# Patient Record
Sex: Male | Born: 1960 | Race: White | Hispanic: No | Marital: Single | Smoking: Current every day smoker
Health system: Southern US, Community
[De-identification: ages and names within clinical notes are randomized; demographics above are authoritative.]

## PROBLEM LIST (undated history)

## (undated) DIAGNOSIS — F4541 Pain disorder exclusively related to psychological factors: Secondary | ICD-10-CM

## (undated) DIAGNOSIS — E785 Hyperlipidemia, unspecified: Secondary | ICD-10-CM

## (undated) DIAGNOSIS — Z973 Presence of spectacles and contact lenses: Secondary | ICD-10-CM

## (undated) DIAGNOSIS — M1990 Unspecified osteoarthritis, unspecified site: Secondary | ICD-10-CM

## (undated) DIAGNOSIS — E119 Type 2 diabetes mellitus without complications: Secondary | ICD-10-CM

## (undated) DIAGNOSIS — F319 Bipolar disorder, unspecified: Secondary | ICD-10-CM

## (undated) DIAGNOSIS — I10 Essential (primary) hypertension: Secondary | ICD-10-CM

## (undated) HISTORY — PX: KNEE ARTHROSCOPY: SUR90

---

## 1998-06-05 ENCOUNTER — Encounter: Payer: Self-pay | Admitting: Emergency Medicine

## 1998-06-05 ENCOUNTER — Emergency Department (HOSPITAL_COMMUNITY): Admission: EM | Admit: 1998-06-05 | Discharge: 1998-06-05 | Payer: Self-pay | Admitting: Emergency Medicine

## 1998-07-06 ENCOUNTER — Emergency Department (HOSPITAL_COMMUNITY): Admission: EM | Admit: 1998-07-06 | Discharge: 1998-07-06 | Payer: Self-pay | Admitting: Emergency Medicine

## 2001-02-18 ENCOUNTER — Ambulatory Visit (HOSPITAL_COMMUNITY): Admission: RE | Admit: 2001-02-18 | Discharge: 2001-02-18 | Payer: Self-pay | Admitting: Gastroenterology

## 2001-02-18 ENCOUNTER — Encounter (INDEPENDENT_AMBULATORY_CARE_PROVIDER_SITE_OTHER): Payer: Self-pay | Admitting: *Deleted

## 2004-04-21 ENCOUNTER — Ambulatory Visit (HOSPITAL_COMMUNITY): Admission: RE | Admit: 2004-04-21 | Discharge: 2004-04-21 | Payer: Self-pay | Admitting: Gastroenterology

## 2007-08-10 ENCOUNTER — Emergency Department (HOSPITAL_COMMUNITY): Admission: EM | Admit: 2007-08-10 | Discharge: 2007-08-10 | Payer: Self-pay | Admitting: Emergency Medicine

## 2009-02-23 ENCOUNTER — Encounter: Admission: RE | Admit: 2009-02-23 | Discharge: 2009-02-23 | Payer: Self-pay | Admitting: Orthopedic Surgery

## 2009-03-10 ENCOUNTER — Ambulatory Visit (HOSPITAL_BASED_OUTPATIENT_CLINIC_OR_DEPARTMENT_OTHER): Admission: RE | Admit: 2009-03-10 | Discharge: 2009-03-10 | Payer: Self-pay | Admitting: Orthopedic Surgery

## 2009-05-24 ENCOUNTER — Emergency Department (HOSPITAL_COMMUNITY): Admission: EM | Admit: 2009-05-24 | Discharge: 2009-05-25 | Payer: Self-pay | Admitting: Emergency Medicine

## 2010-05-12 ENCOUNTER — Emergency Department (HOSPITAL_BASED_OUTPATIENT_CLINIC_OR_DEPARTMENT_OTHER): Admission: EM | Admit: 2010-05-12 | Discharge: 2010-05-12 | Payer: Self-pay | Admitting: Emergency Medicine

## 2010-10-11 LAB — URINALYSIS, ROUTINE W REFLEX MICROSCOPIC
Bilirubin Urine: NEGATIVE
Glucose, UA: NEGATIVE mg/dL
Hgb urine dipstick: NEGATIVE
Ketones, ur: NEGATIVE mg/dL
Nitrite: NEGATIVE
Protein, ur: NEGATIVE mg/dL
Specific Gravity, Urine: 1.011 (ref 1.005–1.030)
Urobilinogen, UA: 0.2 mg/dL (ref 0.0–1.0)
pH: 6.5 (ref 5.0–8.0)

## 2010-10-13 LAB — POCT HEMOGLOBIN-HEMACUE: Hemoglobin: 14.6 g/dL (ref 13.0–17.0)

## 2010-11-21 NOTE — Op Note (Signed)
NAMEJARREAU, Peter             ACCOUNT NO.:  [id number]   MEDICAL RECORD NO.:  [id number]          PATIENT TYPE:  AMB   LOCATION:  DSC                          FACILITY:  [facility]   PHYSICIAN:  Rodney A. Mortenson, M.D.DATE OF BIRTH:  Oct 14, 1960   DATE OF PROCEDURE:  03/10/2009  DATE OF DISCHARGE:                               OPERATIVE REPORT   JUSTIFICATION:  A 50 year old male has had trouble with his knee for  some time.  He has pain along the medial joint line.  No lateral joint  line tenderness, no fluid in the knee.  An MRI of the knee was done and  this shows some changes in the posterior horn of the medial meniscus.  I  reviewed the MRI myself and I felt there was a small tear in this area.  Because of persistent pain and discomfort, he is now admitted for  arthroscopic evaluation and treatment.  Complications are discussed  preoperatively.  Questions are answered encouraged.   JUSTIFICATION FOR OUTPATIENT SURGERY:  Minimal morbidity.   PREOPERATIVE DIAGNOSIS:  Tear of posterior horn medial meniscus, right  knee.   POSTOPERATIVE DIAGNOSES:  Buttonhole tear posterior horn of the medial  meniscus, right knee; cyst of posterior horn of the lateral meniscus;  grade 2 chondromalacia patella.   OPERATION:  Chondroplasty patella; excised cyst of the posterior horn of  lateral meniscus; partial medial meniscectomy, right knee.   SURGEON:  Lenard Galloway. Chaney Malling, MD   ANESTHESIA:  MAC.   PATHOLOGY:  With the arthroscope of the knee, a very careful examination  was undertaken.  There was grade 2 changes of articular cartilage and  posterior aspect of the patella.  The anterior cruciate ligament was  normal.  In the lateral compartment, there was normal articular  cartilage of the lateral femoral condyle, lateral tibial plateau, and  the lateral meniscus, however, there was a large cyst attached to the  posterior horn of the lateral meniscus.  The arthroscope was then placed  in  the medial compartment.  Initially, the medial meniscus appeared  fairly normal, but with probing there was a through-and-through full-  thickness buttonhole tear of the posterior horn of medial meniscus which  could be displaced to the probe.  Posterior aspect of the medial femoral  condyle did show fraying of articular cartilage.   PROCEDURE:  The patient was placed on the operating table in the supine  position.  A pneumatic tourniquet was brought at the right upper thigh.  The right leg was placed in the leg holder and entire right lower  extremity was prepped with DuraPrep and draped out in the usual manner.  An infusion cannula was placed in the superomedial pouch and the knee  was distended with saline.  Anteromedial and anterolateral portals were  made and the scope was introduced.  Attention was first turned to the  posterior aspect of the patella.  Using chondroplasty shaver through  both in the medial and lateral portal, a debridement of the posterior  aspect of the patella was achieved.  The arthroscope was then placed in  the lateral compartment.  There was a cyst attached to the posterior  horn of the lateral meniscus and this was debrided with chondroplasty  shaver.  Complete decompression of the cyst was achieved.  The  arthroscope was then placed in the medial compartment.  Through both  medial and lateral portals, a series of baskets were inserted and a  through-and-through tear of the posterior horn meniscus was debrided  down to smooth stable rim.  The intraarticular shaver was introduced and  this was smoothed out further and then the ArthroCare wand was inserted  and this was used to sculpt the torn remnant as it extended out to the  mid third of medial meniscus which was normal.  Marcaine was then placed  in the knee and a large bulky pressure dressing was applied and the  patient returned in the recovery room in excellent condition.  Technically, this went  extremely well.   DRAINS:  None.   COMPLICATIONS:  None.   DISPOSITION:  1. Usual postop instructions.  2. Percocet for pain.  3. To my office on Wednesday.      Rodney A. Chaney Malling, M.D.  Electronically Signed     RAM/MEDQ  D:  03/10/2009  T:  03/11/2009  Job:  [id number]

## 2010-11-24 NOTE — Op Note (Signed)
NAME:  Peter Roth, Peter Roth             ACCOUNT NO.:  [id number]   MEDICAL RECORD NO.:  [id number]          PATIENT TYPE:  AMB   LOCATION:  ENDO                         [facility]ACILITY:  [facility]   PHYSICIAN:  John C. Madilyn Fireman, M.D.    DATE OF BIRTH:  1961/01/31   DATE OF PROCEDURE:  04/21/2004  DATE OF DISCHARGE:                                 OPERATIVE REPORT   PROCEDURE:  Colonoscopy.   INDICATIONS FOR PROCEDURE:  History of adenomatous colon polyps with last  colonoscopy in 2002.   DESCRIPTION OF PROCEDURE:  The patient was placed in the left lateral  decubitus position and placed on the pulse monitor with continuous low-flow  oxygen delivered by nasal cannula.  He was sedated with 75 mcg IV fentanyl  and 10 mg IV Versed.  The Olympus video colonoscope was inserted into the  rectum and advanced to the cecum, confirmed by transillumination at  McBurney's point and visualization of the ileocecal valve and appendiceal  orifice.  The prep was good.  The cecum, ascending, transverse, descending,  and sigmoid colon all appeared normal with no masses, polyps, diverticula,  or other mucosal abnormalities.  The rectum likewise appeared normal, and  retroflexed view of the anus revealed no obvious internal hemorrhoids.  The  scope was then withdrawn, and the patient returned to the recovery room in  stable condition.  He tolerated the procedure well, and there were no  immediate complications.   IMPRESSION:  Normal colonoscopy.   PLAN:  Repeat study in 5 years.      JCH/MEDQ  D:  04/21/2004  T:  04/21/2004  Job:  [id number]   cc:   Darreld Mclean, MD

## 2010-11-24 NOTE — Procedures (Signed)
[place]  Patient:    Peter Roth, Peter Roth                  MRN: [id number] Proc. Date: 02/18/01 Adm. Date:  [id number] Attending:  Louie Bun CC:         Darreld Mclean, M.D.   Procedure Report  PROCEDURE:  Colonoscopy with polypectomy.  GASTROENTEROLOGIST:  Everardo All. Madilyn Fireman, M.D.  INDICATIONS FOR PROCEDURE:  Heme-positive stool and family history of colon cancer in first degree relative.  DESCRIPTION OF PROCEDURE:   The patient was placed in the left lateral decubitus position and placed on the pulse monitor with continuous low-flow oxygen delivered by nasal cannula.  He was sedated with 70 mg IV Demerol and 7 mg IV Versed.  The Olympus video colonoscope was inserted into the rectum and advanced to the cecum, confirmed by transillumination of McBurneys point and visualization of the ileocecal valve and appendiceal orifice.  The prep was excellent.  The cecum, ascending, transverse, descending, and sigmoid colon all appeared normal with no masses, polyps, diverticula, or other mucosal abnormalities.  At the rectosigmoid junction at about 20 to 25 cm, there was a 2.5 cm pedunculated polyp removed by snare.  The remainder of the rectum appeared normal.  The colonoscope was then withdrawn, and the patient returned to the recovery room in stable condition.  He tolerated the procedure well, and there were no immediate complications.  IMPRESSIONS:  Large rectosigmoid polyp.  PLAN:  Await histology to rule out malignancy and for determination of next colonoscopy. DD:  02/18/01 TD:  02/18/01 Job: [id number]

## 2010-12-02 ENCOUNTER — Emergency Department (HOSPITAL_BASED_OUTPATIENT_CLINIC_OR_DEPARTMENT_OTHER)
Admission: EM | Admit: 2010-12-02 | Discharge: 2010-12-02 | Disposition: A | Discharge status: Home / Self Care | Payer: Medicare Other | Attending: Emergency Medicine | Admitting: Emergency Medicine

## 2010-12-02 ENCOUNTER — Emergency Department (INDEPENDENT_AMBULATORY_CARE_PROVIDER_SITE_OTHER): Payer: Medicare Other

## 2010-12-02 DIAGNOSIS — E785 Hyperlipidemia, unspecified: Secondary | ICD-10-CM | POA: Insufficient documentation

## 2010-12-02 DIAGNOSIS — F319 Bipolar disorder, unspecified: Secondary | ICD-10-CM | POA: Insufficient documentation

## 2010-12-02 DIAGNOSIS — M25569 Pain in unspecified knee: Secondary | ICD-10-CM

## 2010-12-02 DIAGNOSIS — M4310 Spondylolisthesis, site unspecified: Secondary | ICD-10-CM

## 2010-12-02 DIAGNOSIS — M545 Low back pain, unspecified: Secondary | ICD-10-CM

## 2010-12-02 DIAGNOSIS — Y92410 Unspecified street and highway as the place of occurrence of the external cause: Secondary | ICD-10-CM | POA: Insufficient documentation

## 2010-12-02 DIAGNOSIS — F17200 Nicotine dependence, unspecified, uncomplicated: Secondary | ICD-10-CM | POA: Insufficient documentation

## 2010-12-02 DIAGNOSIS — M549 Dorsalgia, unspecified: Secondary | ICD-10-CM | POA: Insufficient documentation

## 2011-10-03 ENCOUNTER — Other Ambulatory Visit: Payer: Self-pay | Admitting: Gastroenterology

## 2011-10-03 DIAGNOSIS — R197 Diarrhea, unspecified: Secondary | ICD-10-CM

## 2011-10-08 ENCOUNTER — Encounter (HOSPITAL_COMMUNITY): Payer: Self-pay | Admitting: Emergency Medicine

## 2011-10-08 ENCOUNTER — Emergency Department (HOSPITAL_COMMUNITY)
Admission: EM | Admit: 2011-10-08 | Discharge: 2011-10-08 | Disposition: A | Discharge status: Home / Self Care | Payer: PRIVATE HEALTH INSURANCE | Attending: Emergency Medicine | Admitting: Emergency Medicine

## 2011-10-08 DIAGNOSIS — L723 Sebaceous cyst: Secondary | ICD-10-CM | POA: Insufficient documentation

## 2011-10-08 DIAGNOSIS — IMO0002 Reserved for concepts with insufficient information to code with codable children: Secondary | ICD-10-CM

## 2011-10-08 DIAGNOSIS — Z7982 Long term (current) use of aspirin: Secondary | ICD-10-CM | POA: Insufficient documentation

## 2011-10-08 DIAGNOSIS — F17200 Nicotine dependence, unspecified, uncomplicated: Secondary | ICD-10-CM | POA: Insufficient documentation

## 2011-10-08 NOTE — ED Notes (Signed)
Pt left without paperwork.  Angry that we would not 'squeeze the stuff' out of his bump.

## 2011-10-08 NOTE — Discharge Instructions (Signed)
There is no evidence on examination today that the swelling on the back of your head is an abscess or has any infection present to warrant emergency drainage. You should see your doctor for the elective procedure of drainage at this point. If you develop pain, redness, or fever associated with this you can return to the ER for re-evaluation and possible further care.    Epidermal Cyst An epidermal cyst is sometimes called a sebaceous cyst, epidermal inclusion cyst, or infundibular cyst. These cysts usually contain a substance that looks "pasty" or "cheesy" and may have a bad smell. This substance is a protein called keratin. Epidermal cysts are usually found on the face, neck, or trunk. They may also occur in the vaginal area or other parts of the genitalia of both men and women. Epidermal cysts are usually small, painless, slow-growing bumps or lumps that move freely under the skin. It is important not to try to pop them. This may cause an infection and lead to tenderness and swelling. CAUSES  Epidermal cysts may be caused by a deep penetrating injury to the skin or a plugged hair follicle, often associated with acne. SYMPTOMS  Epidermal cysts can become inflamed and cause:  Redness.   Tenderness.   Increased temperature of the skin over the bumps or lumps.   Grayish-white, bad smelling material that drains from the bump or lump.  DIAGNOSIS  Epidermal cysts are easily diagnosed by your caregiver during an exam. Rarely, a tissue sample (biopsy) may be taken to rule out other conditions that may resemble epidermal cysts. TREATMENT   Epidermal cysts often get better and disappear on their own. They are rarely ever cancerous.   If a cyst becomes infected, it may become inflamed and tender. This may require opening and draining the cyst. Treatment with antibiotics may be necessary. When the infection is gone, the cyst may be removed with minor surgery.   Small, inflamed cysts can often be  treated with antibiotics or by injecting steroid medicines.   Sometimes, epidermal cysts become large and bothersome. If this happens, surgical removal in your caregiver's office may be necessary.  HOME CARE INSTRUCTIONS  Only take over-the-counter or prescription medicines as directed by your caregiver.   Take your antibiotics as directed. Finish them even if you start to feel better.  SEEK MEDICAL CARE IF:   Your cyst becomes tender, red, or swollen.   Your condition is not improving or is getting worse.   You have any other questions or concerns.  MAKE SURE YOU:  Understand these instructions.   Will watch your condition.   Will get help right away if you are not doing well or get worse.  Document Released: 05/26/2004 Document Revised: 06/14/2011 Document Reviewed: 01/01/2011 [facility] Patient Information 2012 [place], [place].

## 2011-10-08 NOTE — ED Provider Notes (Signed)
History     CSN: [id number]  Arrival date & time 10/08/11  4098   First MD Initiated Contact with Patient 10/08/11 [phone]      Chief Complaint  Patient presents with  . Abscess    (Consider location/radiation/quality/duration/timing/severity/associated sxs/prior treatment) Patient is a 51 y.o. male presenting with abscess. The history is provided by the patient.  Abscess  This is a recurrent problem. Pertinent negatives include no fever.  Pt reports "I get these bumps on the back of my head that need to be drained". Last drainage was in 1989 and pt reports he was advised at that time that it would likely return. Does not recall when it started to return. It is not painful. There is no drainage from the area. There is no associated fever, chills, or neck pain. Nothing makes the swelling better or worse. No prior treatment, though pt reports his primary doctor has advised in the past that he could drain it, but pt does not want to wait for an appointment.  History reviewed. No pertinent past medical history.  History reviewed. No pertinent past surgical history.  No family history on file.  History  Substance Use Topics  . Smoking status: Current Everyday Smoker -- 0.5 packs/day  . Smokeless tobacco: Not on file  . Alcohol Use: No      Review of Systems  Constitutional: Negative for fever and chills.  HENT: Negative for neck pain and neck stiffness.   Skin: Negative for color change, rash and wound.       Positive area of swelling.  Neurological: Negative for dizziness, weakness, numbness and headaches.    Allergies  Review of patient's allergies indicates no known allergies.  Home Medications   Current Outpatient Rx  Name Route Sig Dispense Refill  . ASPIRIN EC 81 MG PO TBEC Oral Take 81 mg by mouth daily.    Marland Kitchen VITAMIN D 1000 UNITS PO TABS Oral Take 1,000 Units by mouth daily.    . OMEGA-3 FATTY ACIDS 1000 MG PO CAPS Oral Take 1 g by mouth daily.      BP 133/83   Pulse 82  Temp(Src) 98.1 F (36.7 C) (Oral)  Resp 15  SpO2 95%  Physical Exam  Nursing note and vitals reviewed. Constitutional: He is oriented to person, place, and time. He appears well-developed and well-nourished. No distress.  HENT:  Head: Normocephalic and atraumatic.    Right Ear: External ear normal.  Left Ear: External ear normal.  Eyes: Pupils are equal, round, and reactive to light.  Neck: Normal range of motion. Neck supple.  Cardiovascular: Normal rate and regular rhythm.   Pulmonary/Chest: Effort normal. No respiratory distress.  Lymphadenopathy:    He has no cervical adenopathy.  Neurological: He is alert and oriented to person, place, and time.  Skin: Skin is warm and dry. No erythema.    ED Course  Procedures (including critical care time)  Labs Reviewed - No data to display No results found.   1. Cyst       MDM  Clinical appearance most consistent with cyst. No pain or sign of infection on exam. Advised patient to follow-up with PCP for drainage as this is an elective procedure in this setting.        Shaaron Adler, PA-C 10/08/11 0830

## 2011-10-08 NOTE — ED Provider Notes (Signed)
Medical screening examination/treatment/procedure(s) were performed by non-physician practitioner and as supervising physician I was immediately available for consultation/collaboration.   Aadhira Heffernan A Tramaine Sauls, MD 10/08/11 1637 

## 2011-10-08 NOTE — ED Notes (Signed)
Pt. Stated, I get these bumps and I have one in the back of my head that needs to be opened.

## 2011-10-08 NOTE — ED Notes (Signed)
Pt reports having chronic abscesses in the back of his head.  Pt is noted to have one abscess at the crown of his head.  No drainage noted.

## 2011-10-10 ENCOUNTER — Ambulatory Visit
Admission: RE | Admit: 2011-10-10 | Discharge: 2011-10-10 | Disposition: A | Discharge status: Home / Self Care | Payer: PRIVATE HEALTH INSURANCE | Source: Ambulatory Visit | Attending: Gastroenterology | Admitting: Gastroenterology

## 2011-10-10 DIAGNOSIS — R197 Diarrhea, unspecified: Secondary | ICD-10-CM

## 2013-03-13 ENCOUNTER — Emergency Department (HOSPITAL_COMMUNITY)
Admission: EM | Admit: 2013-03-13 | Discharge: 2013-03-13 | Disposition: A | Discharge status: Home / Self Care | Payer: PRIVATE HEALTH INSURANCE | Attending: Emergency Medicine | Admitting: Emergency Medicine

## 2013-03-13 ENCOUNTER — Encounter (HOSPITAL_COMMUNITY): Payer: Self-pay | Admitting: Emergency Medicine

## 2013-03-13 ENCOUNTER — Emergency Department (HOSPITAL_COMMUNITY): Payer: PRIVATE HEALTH INSURANCE

## 2013-03-13 DIAGNOSIS — M25819 Other specified joint disorders, unspecified shoulder: Secondary | ICD-10-CM | POA: Insufficient documentation

## 2013-03-13 DIAGNOSIS — F17200 Nicotine dependence, unspecified, uncomplicated: Secondary | ICD-10-CM | POA: Insufficient documentation

## 2013-03-13 DIAGNOSIS — M7541 Impingement syndrome of right shoulder: Secondary | ICD-10-CM

## 2013-03-13 DIAGNOSIS — Z79899 Other long term (current) drug therapy: Secondary | ICD-10-CM | POA: Insufficient documentation

## 2013-03-13 DIAGNOSIS — Z7982 Long term (current) use of aspirin: Secondary | ICD-10-CM | POA: Insufficient documentation

## 2013-03-13 MED ORDER — IBUPROFEN 800 MG PO TABS: 800.0000 mg | ORAL_TABLET | Freq: Three times a day (TID) | ORAL | Status: DC

## 2013-03-13 MED ORDER — HYDROCODONE-ACETAMINOPHEN 5-325 MG PO TABS: 2.0000 | ORAL_TABLET | Freq: Four times a day (QID) | ORAL | Status: DC | PRN

## 2013-03-13 NOTE — ED Provider Notes (Signed)
CSN: [id number]     Arrival date & time 03/13/13  3086 History   First MD Initiated Contact with Patient 03/13/13 [phone]     Chief Complaint  Patient presents with  . Shoulder Pain   (Consider location/radiation/quality/duration/timing/severity/associated sxs/prior Treatment) HPI Comments: Patient presents emergency department with chief complaint of right shoulder pain. He states that his shoulder is been bothering him for the past month. 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 He believes that the constant lifting has caused shoulder pain. He states it now hurts to move his arm at all. He has not tried taking anything to alleviate his symptoms. He is requesting a note saying that he cannot do any heavy lifting.  The history is provided by the patient. No language interpreter was used.    History reviewed. No pertinent past medical history. History reviewed. No pertinent past surgical history. History reviewed. No pertinent family history. History  Substance Use Topics  . Smoking status: Current Every Day Smoker -- 0.50 packs/day  . Smokeless tobacco: Not on file  . Alcohol Use: No    Review of Systems  All other systems reviewed and are negative.    Allergies  Review of patient's allergies indicates no known allergies.  Home Medications   Current Outpatient Rx  Name  Route  Sig  Dispense  Refill  . aspirin EC 81 MG tablet   Oral   Take 81 mg by mouth daily.         Marland Kitchen atorvastatin (LIPITOR) 20 MG tablet   Oral   Take 20 mg by mouth daily.         . cholecalciferol (VITAMIN D) 1000 UNITS tablet   Oral   Take 1,000 Units by mouth daily.         . divalproex (DEPAKOTE ER) 500 MG 24 hr tablet   Oral   Take 1,000 mg by mouth at bedtime.         . fish oil-omega-3 fatty acids 1000 MG capsule   Oral   Take 1 g by mouth daily.         . sertraline (ZOLOFT) 100 MG tablet   Oral   Take 100 mg by mouth daily.         Marland Kitchen  triamterene-hydrochlorothiazide (MAXZIDE-25) 37.5-25 MG per tablet   Oral   Take 1 tablet by mouth daily.          BP 139/78  Pulse 81  Temp(Src) 97.5 F (36.4 C) (Oral)  Resp 16  SpO2 97% Physical Exam  Nursing note and vitals reviewed. Constitutional: He is oriented to person, place, and time. He appears well-developed and well-nourished.  HENT:  Head: Normocephalic and atraumatic.  Eyes: Conjunctivae and EOM are normal. Pupils are equal, round, and reactive to light. Right eye exhibits no discharge. Left eye exhibits no discharge. No scleral icterus.  Neck: Normal range of motion. Neck supple. No JVD present.  Cardiovascular: Normal rate, regular rhythm, normal heart sounds and intact distal pulses.  Exam reveals no gallop and no friction rub.   No murmur heard. Pulmonary/Chest: Effort normal and breath sounds normal. No respiratory distress. He has no wheezes. He has no rales. He exhibits no tenderness.  Abdominal: Soft. Bowel sounds are normal. He exhibits no distension and no mass. There is no tenderness. There is no rebound and no guarding.  Musculoskeletal: Normal range of motion. He exhibits no edema and no tenderness.  Empty can test negative,  Hawkins test [employer] impingement test is positive, range of motion is limited secondary to pain, strength is 5/5  Neurological: He is alert and oriented to person, place, and time. He has normal reflexes.  CN 3-12 intact  Skin: Skin is warm and dry.  Psychiatric: He has a normal mood and affect. His behavior is normal. Judgment and thought content normal.    ED Course  Procedures (including critical care time) Results for orders placed during the hospital encounter of 05/24/09  URINALYSIS, ROUTINE W REFLEX MICROSCOPIC      Result Value Range   Color, Urine YELLOW  YELLOW   APPearance CLOUDY (*) CLEAR   Specific Gravity, Urine 1.011  1.005 - 1.030   pH 6.5  5.0 - 8.0   Glucose, UA NEGATIVE  NEGATIVE mg/dL   Hgb urine dipstick  NEGATIVE  NEGATIVE   Bilirubin Urine NEGATIVE  NEGATIVE   Ketones, ur NEGATIVE  NEGATIVE mg/dL   Protein, ur NEGATIVE  NEGATIVE mg/dL   Urobilinogen, UA 0.2  0.0 - 1.0 mg/dL   Nitrite NEGATIVE  NEGATIVE   Leukocytes, UA    NEGATIVE   Value: NEGATIVE MICROSCOPIC NOT DONE ON URINES WITH NEGATIVE PROTEIN, BLOOD, LEUKOCYTES, NITRITE, OR GLUCOSE <1000 mg/dL.   Dg Shoulder Right  03/13/2013   *RADIOLOGY REPORT*  Clinical Data: Right shoulder pain.  Trauma.  RIGHT SHOULDER - 2+ VIEW  Comparison: None.  Findings: Located acromioclavicular and glenohumeral joints.  No acute fracture.  There is mild degenerative acromioclavicular joint narrowing and subchondral cystic change, with small inferior spurs. Remote appearing lateral right fourth and fifth rib fractures.  IMPRESSION: 1. Negative for acute osseous injury. 2.  Mild acromioclavicular osteoarthritis.   Original Report Authenticated By: Tiburcio Pea      MDM   1. Shoulder impingement, right    Patient with right shoulder pain. Suspect impingement syndrome. Will x-ray. Consider discharge with ibuprofen, rice therapy, and orthopedic followup.    Roxy Horseman, PA-C 03/13/13 [phone]

## 2013-03-13 NOTE — ED Notes (Signed)
Pt arrived to ED with a complaint of shoulder pain.  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  Pt states the right shoulder has pain that feels like pins and needles.

## 2013-03-16 NOTE — ED Provider Notes (Signed)
Medical screening examination/treatment/procedure(s) were performed by non-physician practitioner and as supervising physician I was immediately available for consultation/collaboration.   Claudean Kinds, MD 03/16/13 [phone]

## 2013-07-01 ENCOUNTER — Encounter (HOSPITAL_COMMUNITY): Payer: Self-pay | Admitting: Emergency Medicine

## 2013-07-01 ENCOUNTER — Emergency Department (HOSPITAL_COMMUNITY)
Admission: EM | Admit: 2013-07-01 | Discharge: 2013-07-01 | Disposition: A | Discharge status: Home / Self Care | Payer: PRIVATE HEALTH INSURANCE | Attending: Emergency Medicine | Admitting: Emergency Medicine

## 2013-07-01 DIAGNOSIS — Z79899 Other long term (current) drug therapy: Secondary | ICD-10-CM | POA: Insufficient documentation

## 2013-07-01 DIAGNOSIS — F17200 Nicotine dependence, unspecified, uncomplicated: Secondary | ICD-10-CM | POA: Insufficient documentation

## 2013-07-01 DIAGNOSIS — L02219 Cutaneous abscess of trunk, unspecified: Secondary | ICD-10-CM | POA: Insufficient documentation

## 2013-07-01 DIAGNOSIS — F319 Bipolar disorder, unspecified: Secondary | ICD-10-CM | POA: Insufficient documentation

## 2013-07-01 DIAGNOSIS — Z7982 Long term (current) use of aspirin: Secondary | ICD-10-CM | POA: Insufficient documentation

## 2013-07-01 DIAGNOSIS — Z791 Long term (current) use of non-steroidal anti-inflammatories (NSAID): Secondary | ICD-10-CM | POA: Insufficient documentation

## 2013-07-01 DIAGNOSIS — E7800 Pure hypercholesterolemia, unspecified: Secondary | ICD-10-CM | POA: Insufficient documentation

## 2013-07-01 DIAGNOSIS — L0291 Cutaneous abscess, unspecified: Secondary | ICD-10-CM

## 2013-07-01 HISTORY — DX: Bipolar disorder, unspecified: F31.9

## 2013-07-01 MED ORDER — CEPHALEXIN 500 MG PO CAPS: ORAL_CAPSULE | ORAL | Status: DC

## 2013-07-01 MED ORDER — SULFAMETHOXAZOLE-TRIMETHOPRIM 800-160 MG PO TABS: 1.0000 | ORAL_TABLET | Freq: Two times a day (BID) | ORAL | Status: DC

## 2013-07-01 NOTE — ED Provider Notes (Signed)
Medical screening examination/treatment/procedure(s) were performed by non-physician practitioner and as supervising physician I was immediately available for consultation/collaboration.  EKG Interpretation   None        Kristianna Saperstein T Hakim Minniefield, MD 07/01/13 2352 

## 2013-07-01 NOTE — ED Notes (Signed)
Patient states that he has had a boil x 2 days

## 2013-07-01 NOTE — ED Provider Notes (Signed)
CSN: [id number]     Arrival date & time 07/01/13  2142 History  This chart was scribed for non-physician practitioner, Junious Silk, PA-C,working with Toy Baker, MD, by Karle Plumber, ED Scribe.  This patient was seen in room WTR5/WTR5 and the patient's care was started at 10:01 PM.  Chief Complaint  Patient presents with  . Recurrent Skin Infections   The history is provided by the patient. No language interpreter was used.   HPI Comments:  Peter Roth is a 52 y.o. male who presents to the Emergency Department complaining of an abscess to the right side of his groin onset two days ago. The pain is sharp and burning. It does not radiate. It has gradually gotten worse over the past 2 days. Pt reports associated warmth to the area. He states he has been using "[employer]" with no relief. He denies fever, chills, nausea, and vomiting. He states he has a h/o abscesses.   Past Medical History  Diagnosis Date  . Bipolar 1 disorder   . High cholesterol    History reviewed. No pertinent past surgical history. History reviewed. No pertinent family history. History  Substance Use Topics  . Smoking status: Current Every Day Smoker -- 0.50 packs/day  . Smokeless tobacco: Not on file  . Alcohol Use: No    Review of Systems  Constitutional: Negative for fever.  Gastrointestinal: Negative for nausea and vomiting.  Skin:       Abscess to right groin.  All other systems reviewed and are negative.    Allergies  Review of patient's allergies indicates no known allergies.  Home Medications   Current Outpatient Rx  Name  Route  Sig  Dispense  Refill  . aspirin EC 81 MG tablet   Oral   Take 81 mg by mouth daily.         Marland Kitchen atorvastatin (LIPITOR) 20 MG tablet   Oral   Take 20 mg by mouth daily.         . cholecalciferol (VITAMIN D) 1000 UNITS tablet   Oral   Take 1,000 Units by mouth daily.         . divalproex (DEPAKOTE ER) 500 MG 24 hr tablet   Oral   Take 1,000  mg by mouth at bedtime.         . fish oil-omega-3 fatty acids 1000 MG capsule   Oral   Take 1 g by mouth daily.         Marland Kitchen HYDROcodone-acetaminophen (NORCO/VICODIN) 5-325 MG per tablet   Oral   Take 2 tablets by mouth every 6 (six) hours as needed for pain.   13 tablet   0   . ibuprofen (ADVIL,MOTRIN) 800 MG tablet   Oral   Take 1 tablet (800 mg total) by mouth 3 (three) times daily.   21 tablet   0   . sertraline (ZOLOFT) 100 MG tablet   Oral   Take 100 mg by mouth daily.         Marland Kitchen triamterene-hydrochlorothiazide (MAXZIDE-25) 37.5-25 MG per tablet   Oral   Take 1 tablet by mouth daily.          Triage Vitals: BP 138/79  Pulse 80  Temp(Src) 98.6 F (37 C) (Oral)  Resp 16  SpO2 100% Physical Exam  Nursing note and vitals reviewed. Constitutional: He is oriented to person, place, and time. He appears well-developed and well-nourished. No distress.  HENT:  Head: Normocephalic and atraumatic.  Right  Ear: External ear normal.  Left Ear: External ear normal.  Nose: Nose normal.  Eyes: Conjunctivae are normal.  Neck: Normal range of motion. No tracheal deviation present.  Cardiovascular: Normal rate, regular rhythm and normal heart sounds.   Pulmonary/Chest: Effort normal and breath sounds normal. No stridor.  Abdominal: Soft. He exhibits no distension. There is no tenderness.  Musculoskeletal: Normal range of motion.  Neurological: He is alert and oriented to person, place, and time.  Skin: Skin is warm and dry. He is not diaphoretic.  1 cm area of induration with central pustule with surrounding erythema. No perianal involvement. No scrotal involvement.   Psychiatric: He has a normal mood and affect. His behavior is normal.    ED Course  Procedures (including critical care time) DIAGNOSTIC STUDIES: Oxygen Saturation is 100% on RA, normal by my interpretation.   COORDINATION OF CARE: 10:10 PM- Will I&D abscess. Advised pt to apply warm compresses to the  area. Pt verbalizes understanding and agrees to plan.  INCISION AND DRAINAGE Performed by: Junious Silk, PA-C Consent: Verbal consent obtained. Risks and benefits: risks, benefits and alternatives were discussed Type: abscess  Body area: right groin  Anesthesia: local infiltration  Incision was made with a scalpel.  Local anesthetic: lidocaine 2% with epinephrine  Anesthetic total: 2 ml  Complexity: complex Blunt dissection to break up loculations  Drainage: bloody  Drainage amount: minimal  Patient tolerance: Patient tolerated the procedure well with no immediate complications.  Medications - No data to display  Labs Review Labs Reviewed - No data to display Imaging Review No results found.  EKG Interpretation   None       MDM   1. Abscess and cellulitis    Patient with skin abscess amenable to incision and drainage.  Abscess was not large enough to warrant packing or drain,  wound recheck in 2 days. Encouraged home warm soaks and flushing.  Mild signs of cellulitis is surrounding skin.  Will d/c to home.  Due to only bloody drainage and indurated area, bactrim and keflex were prescribed. Return instructions given. Vital signs stable for discharge. Patient / Family / Caregiver informed of clinical course, understand medical decision-making process, and agree with plan.    I personally performed the services described in this documentation, which was scribed in my presence. The recorded information has been reviewed and is accurate.     Mora Bellman, PA-C 07/01/13 2237

## 2013-11-01 ENCOUNTER — Emergency Department (HOSPITAL_COMMUNITY)
Admission: EM | Admit: 2013-11-01 | Discharge: 2013-11-01 | Disposition: A | Discharge status: Home / Self Care | Payer: PRIVATE HEALTH INSURANCE | Attending: Emergency Medicine | Admitting: Emergency Medicine

## 2013-11-01 ENCOUNTER — Encounter (HOSPITAL_COMMUNITY): Payer: Self-pay | Admitting: Emergency Medicine

## 2013-11-01 DIAGNOSIS — F17200 Nicotine dependence, unspecified, uncomplicated: Secondary | ICD-10-CM | POA: Insufficient documentation

## 2013-11-01 DIAGNOSIS — M25562 Pain in left knee: Secondary | ICD-10-CM

## 2013-11-01 DIAGNOSIS — Z8659 Personal history of other mental and behavioral disorders: Secondary | ICD-10-CM | POA: Insufficient documentation

## 2013-11-01 DIAGNOSIS — E7800 Pure hypercholesterolemia, unspecified: Secondary | ICD-10-CM | POA: Insufficient documentation

## 2013-11-01 DIAGNOSIS — G8929 Other chronic pain: Secondary | ICD-10-CM | POA: Insufficient documentation

## 2013-11-01 DIAGNOSIS — Z7982 Long term (current) use of aspirin: Secondary | ICD-10-CM | POA: Insufficient documentation

## 2013-11-01 DIAGNOSIS — M25569 Pain in unspecified knee: Secondary | ICD-10-CM | POA: Insufficient documentation

## 2013-11-01 DIAGNOSIS — Z79899 Other long term (current) drug therapy: Secondary | ICD-10-CM | POA: Insufficient documentation

## 2013-11-01 MED ORDER — IBUPROFEN 800 MG PO TABS
800.0000 mg | ORAL_TABLET | Freq: Once | ORAL | Status: AC
  Administered 2013-11-01: 800 mg via ORAL
  Filled 2013-11-01: qty 1

## 2013-11-01 MED ORDER — ACETAMINOPHEN-CODEINE #3 300-30 MG PO TABS: 1.0000 | ORAL_TABLET | Freq: Once | ORAL | Status: DC

## 2013-11-01 MED ORDER — DICLOFENAC SODIUM 50 MG PO TBEC: 50.0000 mg | DELAYED_RELEASE_TABLET | Freq: Two times a day (BID) | ORAL | Status: DC

## 2013-11-01 NOTE — ED Notes (Signed)
Per pt, having chronic knee pain.  Pt states knee has been bothering him for a while.  States no injury.  Pt is to be scoped on Wednesday by Dr. Delorise ShinerGrace

## 2013-11-01 NOTE — ED Provider Notes (Signed)
Medical screening examination/treatment/procedure(s) were performed by non-physician practitioner and as supervising physician I was immediately available for consultation/collaboration.   EKG Interpretation None        Lyanne CoKevin M Lenox Bink, MD 11/01/13 1025

## 2013-11-01 NOTE — ED Notes (Signed)
PA at bedside.

## 2013-11-01 NOTE — Discharge Instructions (Signed)
Please follow up with your primary care physician in 1-2 days. If you do not have one [facility] listed above. Please follow up with Dr. Delorise ShinerGrace your orthopedist and to discuss further pain management for your knee pain. Please take Voltaren as prescribed, please follow RICE method below. Please read all discharge instructions and return precautions.   Knee Pain Knee pain can be a result of an injury or other medical conditions. Treatment will depend on the cause of your pain. HOME CARE  Only take medicine as told by your doctor.  Keep a healthy weight. Being overweight can make the knee hurt more.  Stretch before exercising or playing sports.  If there is constant knee pain, change the way you exercise. Ask your doctor for advice.  Make sure shoes fit well. Choose the right shoe for the sport or activity.  Protect your knees. Wear kneepads if needed.  Rest when you are tired. GET HELP RIGHT AWAY IF:   Your knee pain does not stop.  Your knee pain does not get better.  Your knee joint feels hot to the touch.  You have a fever. MAKE SURE YOU:   Understand these instructions.  Will watch this condition.  Will get help right away if you are not doing well or get worse. Document Released: 09/21/2008 Document Revised: 09/17/2011 Document Reviewed: 09/21/2008 [facility] Patient Information 2014 [place], [place].  RICE: Routine Care for Injuries The routine care of many injuries includes Rest, Ice, Compression, and Elevation (RICE). HOME CARE INSTRUCTIONS  Rest is needed to allow your body to heal. Routine activities can usually be resumed when comfortable. Injured tendons and bones can take up to 6 weeks to heal. Tendons are the cord-like structures that attach muscle to bone.  Ice following an injury helps keep the swelling down and reduces pain.  Put ice in a plastic bag.  Place a towel between your skin and the bag.  Leave the ice  on for 15-20 minutes, 03-04 times a day. Do this while awake, for the first 24 to 48 hours. After that, continue as directed by your caregiver.  Compression helps keep swelling down. It also gives support and helps with discomfort. If an elastic bandage has been applied, it should be removed and reapplied every 3 to 4 hours. It should not be applied tightly, but firmly enough to keep swelling down. Watch fingers or toes for swelling, bluish discoloration, coldness, numbness, or excessive pain. If any of these problems occur, remove the bandage and reapply loosely. Contact your caregiver if these problems continue.  Elevation helps reduce swelling and decreases pain. With extremities, such as the arms, hands, legs, and feet, the injured area should be placed near or above the level of the heart, if possible. SEEK IMMEDIATE MEDICAL CARE IF:  You have persistent pain and swelling.  You develop redness, numbness, or unexpected weakness.  Your symptoms are getting worse rather than improving after several days. These symptoms may indicate that further evaluation or further X-rays are needed. Sometimes, X-rays may not show a small broken bone (fracture) until 1 week or 10 days later. Make a follow-up appointment with your caregiver. Ask when your X-ray results will be ready. Make sure you get your X-ray results. Document Released: 10/07/2000 Document Revised: 09/17/2011 Document Reviewed: 11/24/2010 [facility] Patient Information 2014 [place], [place].

## 2013-11-01 NOTE — ED Provider Notes (Signed)
CSN: [id number]     Arrival date & time 11/01/13  0709 History   First MD Initiated Contact with Patient 11/01/13 [phone]     Chief Complaint  Patient presents with  . Knee Pain     (Consider location/radiation/quality/duration/timing/severity/associated sxs/prior Treatment) HPI Comments: Patient is a 53 yo M PMHx significant for bipolar 1 disorder, hyperlipidemia presenting to the ED for left sided chronic knee pain that has been flaring up over the last two weeks. Patient states he is having "severe throbbing pain" to the entire knee with radiation up and down his leg. He states he has been taking a muscle relaxer prescribed to him by his PCP with no relief. He denies taking any over-the-counter anti-inflammatory medications or following placement below. He states he has not discussed his need for better pain control with his primary care doctor or orthopedist. Denies any new injuries or symptoms from chronic ones.   Patient is a 53 y.o. male presenting with knee pain.  Knee Pain Associated symptoms: no fever     Past Medical History  Diagnosis Date  . Bipolar 1 disorder   . High cholesterol    History reviewed. No pertinent past surgical history. History reviewed. No pertinent family history. History  Substance Use Topics  . Smoking status: Current Every Day Smoker -- 0.50 packs/day  . Smokeless tobacco: Not on file  . Alcohol Use: No    Review of Systems  Constitutional: Negative for fever and chills.  Musculoskeletal: Positive for arthralgias.  All other systems reviewed and are negative.     Allergies  Review of patient's allergies indicates no known allergies.  Home Medications   Prior to Admission medications   Medication Sig Start Date End Date Taking? Authorizing Provider  aspirin EC 81 MG tablet Take 81 mg by mouth daily.    Historical Provider, MD  atorvastatin (LIPITOR) 20 MG tablet Take 20 mg by mouth daily.    Historical Provider, MD  cephALEXin (KEFLEX)  500 MG capsule 2 caps po bid x 7 days 07/01/13   Mora BellmanHannah S Merrell, PA-C  cholecalciferol (VITAMIN D) 1000 UNITS tablet Take 1,000 Units by mouth daily.    Historical Provider, MD  divalproex (DEPAKOTE ER) 500 MG 24 hr tablet Take 1,000 mg by mouth at bedtime.    Historical Provider, MD  lidocaine (LIDODERM) 5 % Place 1 patch onto the skin daily.  06/22/13   Historical Provider, MD  sulfamethoxazole-trimethoprim (SEPTRA DS) 800-160 MG per tablet Take 1 tablet by mouth every 12 (twelve) hours. 07/01/13   Mora BellmanHannah S Merrell, PA-C  triamterene-hydrochlorothiazide (MAXZIDE-25) 37.5-25 MG per tablet Take 1 tablet by mouth daily.    Historical Provider, MD   BP 137/86  Pulse 77  Temp(Src) 98.1 F (36.7 C) (Oral)  Resp 16  SpO2 98% Physical Exam  Nursing note and vitals reviewed. Constitutional: He is oriented to person, place, and time. He appears well-developed and well-nourished. No distress.  HENT:  Head: Normocephalic and atraumatic.  Right Ear: External ear normal.  Left Ear: External ear normal.  Nose: Nose normal.  Mouth/Throat: Oropharynx is clear and moist.  Eyes: Conjunctivae are normal.  Neck: Normal range of motion. Neck supple.  Cardiovascular: Normal rate, regular rhythm, normal heart sounds and intact distal pulses.   Pulmonary/Chest: Effort normal and breath sounds normal. No respiratory distress.  Abdominal: Soft.  Musculoskeletal: Normal range of motion. He exhibits no edema and no tenderness.       Right hip: He exhibits normal range  of motion, normal strength, no tenderness and no bony tenderness.       Left hip: He exhibits normal range of motion, normal strength, no tenderness and no bony tenderness.       Right knee: He exhibits normal range of motion, no swelling, no effusion, no ecchymosis, no deformity, no laceration, no erythema, normal alignment and no bony tenderness. No tenderness found.       Left knee: He exhibits normal range of motion, no swelling, no effusion,  no ecchymosis, no deformity, no laceration, no erythema, normal alignment and no bony tenderness. No tenderness found.       Right ankle: Normal.       Left ankle: Normal.       Right lower leg: Normal.       Left lower leg: Normal.  Neurological: He is alert and oriented to person, place, and time. He has normal strength. GCS eye subscore is 4. GCS verbal subscore is 5. GCS motor subscore is 6.  Sensation grossly intact.   Skin: Skin is warm and dry. He is not diaphoretic. No erythema.  Psychiatric: He has a normal mood and affect.    ED Course  Procedures (including critical care time) Medications  ibuprofen (ADVIL,MOTRIN) tablet 800 mg (800 mg Oral Given 11/01/13 0746)    Labs Review Labs Reviewed - No data to display  Imaging Review No results found.   EKG Interpretation None      MDM   Final diagnoses:  Left knee pain    Filed Vitals:   11/01/13 0720  BP: 137/86  Pulse: 77  Temp: 98.1 F (36.7 C)  Resp: 16   Afebrile, NAD, non-toxic appearing, AAOx4. Patient with chronic left knee pain, no new injuries or symptoms. Neurovascularly intact. Normal sensation. No acute deformity noted or abnormality noted. Left knee is non-TTP. Left hip is non-TTP. Patient able to ambulate. No erythema or warmth to the joint. No swelling. Patient with scheduled surgery for knee on Wednesday. Discussed NSAIDs and RICE method with plan to discuss further pain control with his surgeon or PCP. Return precautions discussed. Patient is agreeable to plan. Patient is stable at time of discharge     Jeannetta EllisJennifer L Dutchess Crosland, PA-C 11/01/13 [id number]

## 2014-03-08 ENCOUNTER — Emergency Department (HOSPITAL_COMMUNITY)
Admission: EM | Admit: 2014-03-08 | Discharge: 2014-03-08 | Disposition: A | Discharge status: Home / Self Care | Payer: PRIVATE HEALTH INSURANCE | Attending: Emergency Medicine | Admitting: Emergency Medicine

## 2014-03-08 ENCOUNTER — Emergency Department (HOSPITAL_COMMUNITY): Payer: PRIVATE HEALTH INSURANCE

## 2014-03-08 ENCOUNTER — Encounter (HOSPITAL_COMMUNITY): Payer: Self-pay | Admitting: Emergency Medicine

## 2014-03-08 DIAGNOSIS — R059 Cough, unspecified: Secondary | ICD-10-CM | POA: Diagnosis present

## 2014-03-08 DIAGNOSIS — F319 Bipolar disorder, unspecified: Secondary | ICD-10-CM | POA: Insufficient documentation

## 2014-03-08 DIAGNOSIS — Z791 Long term (current) use of non-steroidal anti-inflammatories (NSAID): Secondary | ICD-10-CM | POA: Diagnosis not present

## 2014-03-08 DIAGNOSIS — Z79899 Other long term (current) drug therapy: Secondary | ICD-10-CM | POA: Diagnosis not present

## 2014-03-08 DIAGNOSIS — J069 Acute upper respiratory infection, unspecified: Secondary | ICD-10-CM | POA: Insufficient documentation

## 2014-03-08 DIAGNOSIS — R05 Cough: Secondary | ICD-10-CM | POA: Insufficient documentation

## 2014-03-08 DIAGNOSIS — F17200 Nicotine dependence, unspecified, uncomplicated: Secondary | ICD-10-CM | POA: Insufficient documentation

## 2014-03-08 DIAGNOSIS — Z7982 Long term (current) use of aspirin: Secondary | ICD-10-CM | POA: Insufficient documentation

## 2014-03-08 DIAGNOSIS — E7800 Pure hypercholesterolemia, unspecified: Secondary | ICD-10-CM | POA: Diagnosis not present

## 2014-03-08 NOTE — ED Provider Notes (Signed)
CSN: [id number]     Arrival date & time 03/08/14  0408 History   First MD Initiated Contact with Patient 03/08/14 [phone]     Chief Complaint  Patient presents with  . Cough     (Consider location/radiation/quality/duration/timing/severity/associated sxs/prior Treatment) HPI Comments: Patient with URI symptoms for the past 2 days here in the emergency department to make, sure he doesn't have pneumonia, as he doesn't want his in-laws to catch it from him.  Denies any fever.  History of pneumonia is an everyday smoker, has a nonproductive cough, and intermittent rhinitis He took NyQuil x1, 2, days ago, but was afraid to take any more  Patient is a 53 y.o. male presenting with cough.  Cough Cough characteristics:  Non-productive Severity:  Mild Duration:  2 days Timing:  Intermittent Progression:  Unchanged Chronicity:  New Smoker: yes   Relieved by:  Nothing Worsened by:  Nothing tried Ineffective treatments:  None tried Associated symptoms: rhinorrhea   Associated symptoms: no chest pain, no chills, no fever, no headaches, no rash, no shortness of breath, no sore throat and no wheezing     Past Medical History  Diagnosis Date  . Bipolar 1 disorder   . High cholesterol    History reviewed. No pertinent past surgical history. History reviewed. No pertinent family history. History  Substance Use Topics  . Smoking status: Current Every Day Smoker -- 0.50 packs/day  . Smokeless tobacco: Not on file  . Alcohol Use: No    Review of Systems  Constitutional: Negative for fever and chills.  HENT: Positive for rhinorrhea. Negative for sore throat and trouble swallowing.   Respiratory: Positive for cough. Negative for shortness of breath and wheezing.   Cardiovascular: Negative for chest pain.  Skin: Negative for rash.  Neurological: Negative for dizziness and headaches.  All other systems reviewed and are negative.     Allergies  Review of patient's allergies indicates no  known allergies.  Home Medications   Prior to Admission medications   Medication Sig Start Date End Date Taking? Authorizing Provider  aspirin EC 81 MG tablet Take 81 mg by mouth daily.   Yes Historical Provider, MD  atorvastatin (LIPITOR) 20 MG tablet Take 20 mg by mouth daily.   Yes Historical Provider, MD  divalproex (DEPAKOTE ER) 500 MG 24 hr tablet Take 1,000 mg by mouth at bedtime.   Yes Historical Provider, MD  meloxicam (MOBIC) 15 MG tablet Take 1 tablet by mouth daily. 02/01/14  Yes Historical Provider, MD  Pseudoeph-Doxylamine-DM-APAP (NYQUIL PO) Take 30 mLs by mouth at bedtime as needed (cold symptoms).   Yes Historical Provider, MD  sertraline (ZOLOFT) 100 MG tablet Take 1 tablet by mouth daily. 02/27/14  Yes Historical Provider, MD  triamterene-hydrochlorothiazide (MAXZIDE-25) 37.5-25 MG per tablet Take 1 tablet by mouth daily.   Yes Historical Provider, MD   BP 128/77  Pulse 100  Temp(Src) 98.5 F (36.9 C) (Oral)  Resp 18  SpO2 98% Physical Exam  Nursing note and vitals reviewed. Constitutional: He is oriented to person, place, and time. He appears well-developed and well-nourished.  HENT:  Head: Normocephalic.  Mouth/Throat: Oropharynx is clear and moist.  Neck: Normal range of motion.  Cardiovascular: Normal rate and regular rhythm.   Pulmonary/Chest: Effort normal and breath sounds normal. No respiratory distress. He has no wheezes.  Abdominal: Soft.  Musculoskeletal: Normal range of motion.  Lymphadenopathy:    He has no cervical adenopathy.  Neurological: He is alert and oriented to person, place,  and time.  Skin: Skin is warm and dry. Rash noted.    ED Course  Procedures (including critical care time) Labs Review Labs Reviewed - No data to display  Imaging Review Dg Chest 2 View  03/08/2014   CLINICAL DATA:  Cough  EXAM: CHEST  2 VIEW  COMPARISON:  None.  FINDINGS: No confluent airspace opacity, pleural effusion, or pneumothorax. Cardiomediastinal  contours within normal range. Right hilar granuloma. Mild aortic tortuosity. Mild multilevel degenerative changes.  IMPRESSION: No radiographic evidence of active cardiopulmonary disease.   Electronically Signed   By: Jearld Lesch M.D.   On: 03/08/2014 04:46     EKG Interpretation None      MDM   Final diagnoses:  URI (upper respiratory infection)         Arman Filter, NP 03/08/14 [phone]

## 2014-03-08 NOTE — ED Notes (Signed)
Patient is alert and oriented x3.  He is complaining of a cough with congestion in the chest. Patient states that this cough started 2 days ago and he has had some phlegm that was light  Colored green and a slight fever.  Currently he denies any pain.

## 2014-03-08 NOTE — ED Provider Notes (Signed)
Medical screening examination/treatment/procedure(s) were performed by non-physician practitioner and as supervising physician I was immediately available for consultation/collaboration.   EKG Interpretation None       Myrah Strawderman M Laurajean Hosek, MD 03/08/14 0525 

## 2014-03-08 NOTE — Discharge Instructions (Signed)
Upper Respiratory Infection, Adult °An upper respiratory infection (URI) is also sometimes known as the common cold. The upper respiratory tract includes the nose, sinuses, throat, trachea, and bronchi. Bronchi are the airways leading to the lungs. Most people improve within 1 week, but symptoms can last up to 2 weeks. A residual cough may last even longer.  °CAUSES °Many different viruses can infect the tissues lining the upper respiratory tract. The tissues become irritated and inflamed and often become very moist. Mucus production is also common. A cold is contagious. You can easily spread the virus to others by oral contact. This includes kissing, sharing a glass, coughing, or sneezing. Touching your mouth or nose and then touching a surface, which is then touched by another person, can also spread the virus. °SYMPTOMS  °Symptoms typically develop 1 to 3 days after you come in contact with a cold virus. Symptoms vary from person to person. They may include: °· Runny nose. °· Sneezing. °· Nasal congestion. °· Sinus irritation. °· Sore throat. °· Loss of voice (laryngitis). °· Cough. °· Fatigue. °· Muscle aches. °· Loss of appetite. °· Headache. °· Low-grade fever. °DIAGNOSIS  °You might diagnose your own cold based on familiar symptoms, since most people get a cold 2 to 3 times a year. Your caregiver can confirm this based on your exam. Most importantly, your caregiver can check that your symptoms are not due to another disease such as strep throat, sinusitis, pneumonia, asthma, or epiglottitis. Blood tests, throat tests, and X-rays are not necessary to diagnose a common cold, but they may sometimes be helpful in excluding other more serious diseases. Your caregiver will decide if any further tests are required. °RISKS AND COMPLICATIONS  °You may be at risk for a more severe case of the common cold if you smoke cigarettes, have chronic heart disease (such as heart failure) or lung disease (such as asthma), or if  you have a weakened immune system. The very young and very old are also at risk for more serious infections. Bacterial sinusitis, middle ear infections, and bacterial pneumonia can complicate the common cold. The common cold can worsen asthma and chronic obstructive pulmonary disease (COPD). Sometimes, these complications can require emergency medical care and may be life-threatening. °PREVENTION  °The best way to protect against getting a cold is to practice good hygiene. Avoid oral or hand contact with people with cold symptoms. Wash your hands often if contact occurs. There is no clear evidence that vitamin C, vitamin E, echinacea, or exercise reduces the chance of developing a cold. However, it is always recommended to get plenty of rest and practice good nutrition. °TREATMENT  °Treatment is directed at relieving symptoms. There is no cure. Antibiotics are not effective, because the infection is caused by a virus, not by bacteria. Treatment may include: °· Increased fluid intake. Sports drinks offer valuable electrolytes, sugars, and fluids. °· Breathing heated mist or steam (vaporizer or shower). °· Eating chicken soup or other clear broths, and maintaining good nutrition. °· Getting plenty of rest. °· Using gargles or lozenges for comfort. °· Controlling fevers with ibuprofen or acetaminophen as directed by your caregiver. °· Increasing usage of your inhaler if you have asthma. °Zinc gel and zinc lozenges, taken in the first 24 hours of the common cold, can shorten the duration and lessen the severity of symptoms. Pain medicines may help with fever, muscle aches, and throat pain. A variety of non-prescription medicines are available to treat congestion and runny nose. Your caregiver   can make recommendations and may suggest nasal or lung inhalers for other symptoms.  °HOME CARE INSTRUCTIONS  °· Only take over-the-counter or prescription medicines for pain, discomfort, or fever as directed by your  caregiver. °· Use a warm mist humidifier or inhale steam from a shower to increase air moisture. This may keep secretions moist and make it easier to breathe. °· Drink enough water and fluids to keep your urine clear or pale yellow. °· Rest as needed. °· Return to work when your temperature has returned to normal or as your caregiver advises. You may need to stay home longer to avoid infecting others. You can also use a face mask and careful hand washing to prevent spread of the virus. °SEEK MEDICAL CARE IF:  °· After the first few days, you feel you are getting worse rather than better. °· You need your caregiver's advice about medicines to control symptoms. °· You develop chills, worsening shortness of breath, or brown or red sputum. These may be signs of pneumonia. °· You develop yellow or brown nasal discharge or pain in the face, especially when you bend forward. These may be signs of sinusitis. °· You develop a fever, swollen neck glands, pain with swallowing, or white areas in the back of your throat. These may be signs of strep throat. °SEEK IMMEDIATE MEDICAL CARE IF:  °· You have a fever. °· You develop severe or persistent headache, ear pain, sinus pain, or chest pain. °· You develop wheezing, a prolonged cough, cough up blood, or have a change in your usual mucus (if you have chronic lung disease). °· You develop sore muscles or a stiff neck. °Document Released: 12/19/2000 Document Revised: 09/17/2011 Document Reviewed: 09/30/2013 °[employer]® Patient Information ©2015 [employer]. This information is not intended to replace advice given to you by your health care provider. Make sure you discuss any questions you have with your health care provider. ° °Cough, Adult ° A cough is a reflex that helps clear your throat and airways. It can help heal the body or may be a reaction to an irritated airway. A cough may only last 2 or 3 weeks (acute) or may last more than 8 weeks (chronic).  °CAUSES °Acute  cough: °· Viral or bacterial infections. °Chronic cough: °· Infections. °· Allergies. °· Asthma. °· Post-nasal drip. °· Smoking. °· Heartburn or acid reflux. °· Some medicines. °· Chronic lung problems (COPD). °· Cancer. °SYMPTOMS  °· Cough. °· Fever. °· Chest pain. °· Increased breathing rate. °· High-pitched whistling sound when breathing (wheezing). °· Colored mucus that you cough up (sputum). °TREATMENT  °· A bacterial cough may be treated with antibiotic medicine. °· A viral cough must run its course and will not respond to antibiotics. °· Your caregiver may recommend other treatments if you have a chronic cough. °HOME CARE INSTRUCTIONS  °· Only take over-the-counter or prescription medicines for pain, discomfort, or fever as directed by your caregiver. Use cough suppressants only as directed by your caregiver. °· Use a cold steam vaporizer or humidifier in your bedroom or home to help loosen secretions. °· Sleep in a semi-upright position if your cough is worse at night. °· Rest as needed. °· Stop smoking if you smoke. °SEEK IMMEDIATE MEDICAL CARE IF:  °· You have pus in your sputum. °· Your cough starts to worsen. °· You cannot control your cough with suppressants and are losing sleep. °· You begin coughing up blood. °· You have difficulty breathing. °· You develop pain which is   getting worse or is uncontrolled with medicine. °· You have a fever. °MAKE SURE YOU:  °· Understand these instructions. °· Will watch your condition. °· Will get help right away if you are not doing well or get worse. °Document Released: 12/22/2010 Document Revised: 09/17/2011 Document Reviewed: 12/22/2010 °[employer]® Patient Information ©2015 [employer]. This information is not intended to replace advice given to you by your health care provider. Make sure you discuss any questions you have with your health care provider. ° °

## 2014-03-18 ENCOUNTER — Emergency Department (HOSPITAL_COMMUNITY)
Admission: EM | Admit: 2014-03-18 | Discharge: 2014-03-18 | Disposition: A | Discharge status: Home / Self Care | Payer: PRIVATE HEALTH INSURANCE | Attending: Emergency Medicine | Admitting: Emergency Medicine

## 2014-03-18 ENCOUNTER — Emergency Department (HOSPITAL_COMMUNITY): Payer: PRIVATE HEALTH INSURANCE

## 2014-03-18 ENCOUNTER — Encounter (HOSPITAL_COMMUNITY): Payer: Self-pay | Admitting: Emergency Medicine

## 2014-03-18 DIAGNOSIS — E7800 Pure hypercholesterolemia, unspecified: Secondary | ICD-10-CM | POA: Diagnosis not present

## 2014-03-18 DIAGNOSIS — Y9289 Other specified places as the place of occurrence of the external cause: Secondary | ICD-10-CM | POA: Insufficient documentation

## 2014-03-18 DIAGNOSIS — Y9389 Activity, other specified: Secondary | ICD-10-CM | POA: Insufficient documentation

## 2014-03-18 DIAGNOSIS — Z791 Long term (current) use of non-steroidal anti-inflammatories (NSAID): Secondary | ICD-10-CM | POA: Diagnosis not present

## 2014-03-18 DIAGNOSIS — S52599A Other fractures of lower end of unspecified radius, initial encounter for closed fracture: Secondary | ICD-10-CM | POA: Insufficient documentation

## 2014-03-18 DIAGNOSIS — Z79899 Other long term (current) drug therapy: Secondary | ICD-10-CM | POA: Diagnosis not present

## 2014-03-18 DIAGNOSIS — S59919A Unspecified injury of unspecified forearm, initial encounter: Secondary | ICD-10-CM

## 2014-03-18 DIAGNOSIS — S6990XA Unspecified injury of unspecified wrist, hand and finger(s), initial encounter: Secondary | ICD-10-CM

## 2014-03-18 DIAGNOSIS — F319 Bipolar disorder, unspecified: Secondary | ICD-10-CM | POA: Diagnosis not present

## 2014-03-18 DIAGNOSIS — S59909A Unspecified injury of unspecified elbow, initial encounter: Secondary | ICD-10-CM | POA: Diagnosis present

## 2014-03-18 DIAGNOSIS — F17200 Nicotine dependence, unspecified, uncomplicated: Secondary | ICD-10-CM | POA: Diagnosis not present

## 2014-03-18 DIAGNOSIS — Z7982 Long term (current) use of aspirin: Secondary | ICD-10-CM | POA: Diagnosis not present

## 2014-03-18 DIAGNOSIS — S52502A Unspecified fracture of the lower end of left radius, initial encounter for closed fracture: Secondary | ICD-10-CM

## 2014-03-18 DIAGNOSIS — W010XXA Fall on same level from slipping, tripping and stumbling without subsequent striking against object, initial encounter: Secondary | ICD-10-CM | POA: Diagnosis not present

## 2014-03-18 MED ORDER — OXYCODONE-ACETAMINOPHEN 5-325 MG PO TABS: 1.0000 | ORAL_TABLET | Freq: Four times a day (QID) | ORAL | Status: DC | PRN

## 2014-03-18 NOTE — Discharge Instructions (Signed)
Radial Fracture You have a broken bone (fracture) of the forearm. This is the part of your arm between the elbow and your wrist. Your forearm is made up of two bones. These are the radius and ulna. Your fracture is in the radial shaft. This is the bone in your forearm located on the thumb side. A cast or splint is used to protect and keep your injured bone from moving. The cast or splint will be on generally for about 5 to 6 weeks, with individual variations. HOME CARE INSTRUCTIONS   Keep the injured part elevated while sitting or lying down. Keep the injury above the level of your heart (the center of the chest). This will decrease swelling and pain.  Apply ice to the injury for 15-20 minutes, 03-04 times per day while awake, for 2 days. Put the ice in a plastic bag and place a towel between the bag of ice and your cast or splint.  Move your fingers to avoid stiffness and minimize swelling.  If you have a plaster or fiberglass cast:  Do not try to scratch the skin under the cast using sharp or pointed objects.  Check the skin around the cast every day. You may put lotion on any red or sore areas.  Keep your cast dry and clean.  If you have a plaster splint:  Wear the splint as directed.  You may loosen the elastic around the splint if your fingers become numb, tingle, or turn cold or blue.  Do not put pressure on any part of your cast or splint. It may break. Rest your cast only on a pillow for the first 24 hours until it is fully hardened.  Your cast or splint can be protected during bathing with a plastic bag. Do not lower the cast or splint into water.  Only take over-the-counter or prescription medicines for pain, discomfort, or fever as directed by your caregiver. SEEK IMMEDIATE MEDICAL CARE IF:   Your cast gets damaged or breaks.  You have more severe pain or swelling than you did before getting the cast.  You have severe pain when stretching your fingers.  There is a bad  smell, new stains and/or pus-like (purulent) drainage coming from under the cast.  Your fingers or hand turn pale or blue and become cold or your loose feeling. Document Released: 12/06/2005 Document Revised: 09/17/2011 Document Reviewed: 03/04/2006 [facility] Patient Information 2015 [place], [place]. This information is not intended to replace advice given to you by your health care provider. Make sure you discuss any questions you have with your health care provider. RICE: Routine Care for Injuries The routine care of many injuries includes Rest, Ice, Compression, and Elevation (RICE). HOME CARE INSTRUCTIONS  Rest is needed to allow your body to heal. Routine activities can usually be resumed when comfortable. Injured tendons and bones can take up to 6 weeks to heal. Tendons are the cord-like structures that attach muscle to bone.  Ice following an injury helps keep the swelling down and reduces pain.  Put ice in a plastic bag.  Place a towel between your skin and the bag.  Leave the ice on for 15-20 minutes, 3-4 times a day, or as directed by your health care provider. Do this while awake, for the first 24 to 48 hours. After that, continue as directed by your caregiver.  Compression helps keep swelling down. It also gives support and helps with discomfort. If an elastic bandage has been applied, it should be removed  and reapplied every 3 to 4 hours. It should not be applied tightly, but firmly enough to keep swelling down. Watch fingers or toes for swelling, bluish discoloration, coldness, numbness, or excessive pain. If any of these problems occur, remove the bandage and reapply loosely. Contact your caregiver if these problems continue.  Elevation helps reduce swelling and decreases pain. With extremities, such as the arms, hands, legs, and feet, the injured area should be placed near or above the level of the heart, if possible. SEEK IMMEDIATE MEDICAL CARE IF:  You have persistent pain  and swelling.  You develop redness, numbness, or unexpected weakness.  Your symptoms are getting worse rather than improving after several days. These symptoms may indicate that further evaluation or further X-rays are needed. Sometimes, X-rays may not show a small broken bone (fracture) until 1 week or 10 days later. Make a follow-up appointment with your caregiver. Ask when your X-ray results will be ready. Make sure you get your X-ray results. Document Released: 10/07/2000 Document Revised: 06/30/2013 Document Reviewed: 11/24/2010 [facility] Patient Information 2015 [place], [place]. This information is not intended to replace advice given to you by your health care provider. Make sure you discuss any questions you have with your health care provider.

## 2014-03-18 NOTE — ED Notes (Signed)
Waiting for ortho tech 

## 2014-03-18 NOTE — ED Notes (Signed)
Pt c/o left wrist pain, was loaded boat and slip, trying to catch self. Pt has swelling to left wrist.

## 2014-03-18 NOTE — ED Notes (Signed)
Pt reports slipped on a boat, tried to catch his fall with his L hand.  Pt reports L wrist pain, swelling noted.

## 2014-03-18 NOTE — ED Provider Notes (Signed)
Medical screening examination/treatment/procedure(s) were performed by non-physician practitioner and as supervising physician I was immediately available for consultation/collaboration.   EKG Interpretation None       Evah Rashid, MD 03/18/14 2345 

## 2014-03-18 NOTE — ED Provider Notes (Signed)
CSN: [id number]     Arrival date & time 03/18/14  1832 History  This chart was scribed for Antony Madura, PA-C working with Arby Barrette, MD by Evon Slack, ED Scribe. This patient was seen in room WTR9/WTR9 and the patient's care was started at 8:46 PM.   Chief Complaint  Patient presents with  . Wrist Pain    left   The history is provided by the patient. No language interpreter was used.   HPI Comments: Peter Roth is a 53 y.o. male who presents to the Emergency Department complaining of constant left wrist pain onset 4 PM. He states that he slipped while loading his boat and fell onto his wrist trying to catch himself. He denies taking any medication prior to arrival. He states he has associated swelling. Denies numbness, weakness, redness.  Past Medical History  Diagnosis Date  . Bipolar 1 disorder   . High cholesterol    History reviewed. No pertinent past surgical history. No family history on file. History  Substance Use Topics  . Smoking status: Current Every Day Smoker -- 0.50 packs/day  . Smokeless tobacco: Not on file  . Alcohol Use: No    Review of Systems  Musculoskeletal: Positive for arthralgias (left wrist).  Neurological: Negative for numbness.  All other systems reviewed and are negative.   Allergies  Review of patient's allergies indicates no known allergies.  Home Medications   Prior to Admission medications   Medication Sig Start Date End Date Taking? Authorizing Provider  aspirin EC 81 MG tablet Take 81 mg by mouth daily.    Historical Provider, MD  atorvastatin (LIPITOR) 20 MG tablet Take 20 mg by mouth daily.    Historical Provider, MD  divalproex (DEPAKOTE ER) 500 MG 24 hr tablet Take 1,000 mg by mouth at bedtime.    Historical Provider, MD  meloxicam (MOBIC) 15 MG tablet Take 1 tablet by mouth daily. 02/01/14   Historical Provider, MD  oxyCODONE-acetaminophen (PERCOCET/ROXICET) 5-325 MG per tablet Take 1-2 tablets by mouth every 6 (six)  hours as needed for moderate pain or severe pain. 03/18/14   Antony Madura, PA-C  Pseudoeph-Doxylamine-DM-APAP (NYQUIL PO) Take 30 mLs by mouth at bedtime as needed (cold symptoms).    Historical Provider, MD  sertraline (ZOLOFT) 100 MG tablet Take 1 tablet by mouth daily. 02/27/14   Historical Provider, MD  triamterene-hydrochlorothiazide (MAXZIDE-25) 37.5-25 MG per tablet Take 1 tablet by mouth daily.    Historical Provider, MD   Triage Vitals: BP 141/85  Pulse 101  Temp(Src) 98.3 F (36.8 C) (Oral)  Resp 18  SpO2 96%  Physical Exam  Nursing note and vitals reviewed. Constitutional: He is oriented to person, place, and time. He appears well-developed and well-nourished. No distress.  Nontoxic/nonseptic appearing  HENT:  Head: Normocephalic and atraumatic.  Eyes: Conjunctivae and EOM are normal. No scleral icterus.  Neck: Normal range of motion.  Cardiovascular: Normal rate, regular rhythm and intact distal pulses.   Distal radial pulse 2+ in LUE. Capillary refill brisk in all digits.  Pulmonary/Chest: Effort normal. No respiratory distress.  Musculoskeletal:       Left wrist: He exhibits decreased range of motion (secondary to pain), tenderness, bony tenderness and swelling (mild). He exhibits no effusion, no crepitus, no deformity and no laceration.       Left forearm: Normal.       Arms:      Left hand: Normal.       Hands: No crepitus, deformity, or  effusion. Mild soft tissue swelling at areas of tenderness.  Neurological: He is alert and oriented to person, place, and time. He exhibits normal muscle tone. Coordination normal.  Sensation to light touch intact in LUE. Finger to thumb opposition intact in L hand.  Skin: Skin is warm and dry. No rash noted. He is not diaphoretic. No erythema. No pallor.  Psychiatric: He has a normal mood and affect. His behavior is normal.    ED Course  Procedures (including critical care time) DIAGNOSTIC STUDIES: Oxygen Saturation is 96% on RA,  normal by my interpretation.    COORDINATION OF CARE: 8:52 PM-Discussed treatment plan which includes wrist splint with pt at bedside and pt agreed to plan.   Labs Review Labs Reviewed - No data to display  Imaging Review Dg Wrist Complete Left  03/18/2014   CLINICAL DATA:  Left wrist pain following injury  EXAM: LEFT WRIST - COMPLETE 3+ VIEW  COMPARISON:  None.  FINDINGS: Mildly displaced distal left radial fracture is noted. It does not appear to extend into the radiocarpal articulation. No significant soft tissue abnormality is seen. No other focal abnormality is noted. An old healed fifth metacarpal fracture is seen.  IMPRESSION: Mildly displaced distal left radial fracture involving the metaphysis.   Electronically Signed   By: Alcide Clever M.D.   On: 03/18/2014 19:26     EKG Interpretation None      MDM   Final diagnoses:  Radius distal fracture, left, closed, initial encounter    53 year old male presents to the emergency department for left wrist pain after a mechanical fall. Patient is neurovascularly intact. No gross sensory deficits appreciated. No bony deformity, crepitus, or effusion on exam. X-ray today significant for mildly displaced distal left radial fracture. Patient placed in sugar tong splint and ED. He will be referred to hand specialist for outpatient followup. RICE advised. Return precautions discussed and provided. Patient agreeable to plan with no unaddressed concerns.  I personally performed the services described in this documentation, which was scribed in my presence. The recorded information has been reviewed and is accurate.   Filed Vitals:   03/18/14 1854  BP: 141/85  Pulse: 101  Temp: 98.3 F (36.8 C)  TempSrc: Oral  Resp: 18  SpO2: 96%       Antony Madura, PA-C 03/18/14 2142

## 2014-04-09 ENCOUNTER — Encounter (HOSPITAL_COMMUNITY): Payer: Self-pay | Admitting: Emergency Medicine

## 2014-04-09 ENCOUNTER — Emergency Department (HOSPITAL_COMMUNITY)
Admission: EM | Admit: 2014-04-09 | Discharge: 2014-04-09 | Disposition: A | Discharge status: Home / Self Care | Payer: PRIVATE HEALTH INSURANCE | Attending: Emergency Medicine | Admitting: Emergency Medicine

## 2014-04-09 DIAGNOSIS — Z4789 Encounter for other orthopedic aftercare: Secondary | ICD-10-CM | POA: Insufficient documentation

## 2014-04-09 DIAGNOSIS — Z791 Long term (current) use of non-steroidal anti-inflammatories (NSAID): Secondary | ICD-10-CM | POA: Diagnosis not present

## 2014-04-09 DIAGNOSIS — F319 Bipolar disorder, unspecified: Secondary | ICD-10-CM | POA: Insufficient documentation

## 2014-04-09 DIAGNOSIS — Z720 Tobacco use: Secondary | ICD-10-CM | POA: Diagnosis not present

## 2014-04-09 DIAGNOSIS — Z4689 Encounter for fitting and adjustment of other specified devices: Secondary | ICD-10-CM

## 2014-04-09 DIAGNOSIS — Z7982 Long term (current) use of aspirin: Secondary | ICD-10-CM | POA: Diagnosis not present

## 2014-04-09 DIAGNOSIS — E780 Pure hypercholesterolemia: Secondary | ICD-10-CM | POA: Insufficient documentation

## 2014-04-09 DIAGNOSIS — Z79899 Other long term (current) drug therapy: Secondary | ICD-10-CM | POA: Diagnosis not present

## 2014-04-09 NOTE — ED Notes (Signed)
Pt states he wants "his cast off".  States he saw MD a week ago.  Was told then that it was healing well and that "he might can take cast off early"  Pt wants cast off today and states he will cut it off himself.  Here today for arm "sling", no "splint", no "brace".  Pt cannot recall what item was called that MD would put on next.

## 2014-04-09 NOTE — ED Provider Notes (Signed)
Medical screening examination/treatment/procedure(s) were conducted as a shared visit with non-physician practitioner(s) or resident and myself. I personally evaluated the patient during the encounter and agree with the findings.  I have personally reviewed any xrays and/ or EKG's with the provider and I agree with interpretation.  Patient came to the ER requesting that he get his cast off despite his orthopedic Dr. recommending have it off in 2 weeks. Patient says he'll cut off himself if we cannot help him. Patient denies infectious signs. On exam compartment soft, full range of motion of fingers, nv intact left arm. Patient clinically has capacity to make decisions, patient does not have clinical signs of acute psychosis or manic. Patient understands that his bones may not be fully healed and this may worsen his outcome. [facility] called to cut the cast off and followup with orthopedics as recommended. Flexible splint to be given in ER.  Cast removal       Enid SkeensJoshua M Kalan Yeley, MD 04/09/14 [phone]

## 2014-04-09 NOTE — ED Notes (Signed)
Pt escorted to discharge window. Verbalized understanding discharge instructions. In no acute distress.   

## 2014-04-09 NOTE — ED Notes (Signed)
PA at bedside.

## 2014-04-09 NOTE — ED Notes (Signed)
Ortho Tech at bedside.  

## 2014-04-09 NOTE — Discharge Instructions (Signed)
You have elected to have your cast removed early.  Please wear splint to help protect arm as it finishes healing. Follow-up with orthopedics as previously scheduled on 10/14 Return to the ED for new concerns.

## 2014-04-09 NOTE — ED Provider Notes (Signed)
CSN: [id number]     Arrival date & time 04/09/14  0710 History   First MD Initiated Contact with Patient 04/09/14 [phone]     Chief Complaint  Patient presents with  . Wants Cast Off      (Consider location/radiation/quality/duration/timing/severity/associated sxs/prior Treatment) The history is provided by the patient and medical records.   This is a 53 y.o. F with PMH significant for HLP and bipolar disorder, presenting to the ED requesting cast removal.  Patient sustained a left distal radius fracture on 03/18/14, seen in the ED at that time.  Patient was splinted and at follow-up with orthopedics, Dr. Amanda Pea, he was placed into a cast.  States cast is scheduled to be taken off at his next appointment which is 04/21/2014, however states he will take it off now. Patient denies any problems with swelling, numbness, or paresthesias of his left arm.  No fever chills.  Denies pain.  Patient states that the cast has not taken off in the ED, he will cut it off himself.  Past Medical History  Diagnosis Date  . Bipolar 1 disorder   . High cholesterol    History reviewed. No pertinent past surgical history. History reviewed. No pertinent family history. History  Substance Use Topics  . Smoking status: Current Every Day Smoker -- 0.50 packs/day  . Smokeless tobacco: Not on file  . Alcohol Use: No    Review of Systems  Musculoskeletal:       Cast removal  All other systems reviewed and are negative.     Allergies  Sulfa antibiotics  Home Medications   Prior to Admission medications   Medication Sig Start Date End Date Taking? Authorizing Provider  aspirin EC 81 MG tablet Take 81 mg by mouth daily.    Historical Provider, MD  atorvastatin (LIPITOR) 20 MG tablet Take 20 mg by mouth daily.    Historical Provider, MD  divalproex (DEPAKOTE ER) 500 MG 24 hr tablet Take 1,000 mg by mouth at bedtime.    Historical Provider, MD  meloxicam (MOBIC) 15 MG tablet Take 1 tablet by mouth daily.  02/01/14   Historical Provider, MD  sertraline (ZOLOFT) 100 MG tablet Take 1 tablet by mouth daily. 02/27/14   Historical Provider, MD  triamterene-hydrochlorothiazide (MAXZIDE-25) 37.5-25 MG per tablet Take 1 tablet by mouth daily.    Historical Provider, MD   BP 151/92  Pulse 85  Temp(Src) 98.2 F (36.8 C) (Oral)  Resp 20  SpO2 97%  Physical Exam  Nursing note and vitals reviewed. Constitutional: He is oriented to person, place, and time. He appears well-developed and well-nourished.  HENT:  Head: Normocephalic and atraumatic.  Mouth/Throat: Oropharynx is clear and moist.  Eyes: Conjunctivae and EOM are normal. Pupils are equal, round, and reactive to light.  Neck: Normal range of motion.  Cardiovascular: Normal rate, regular rhythm and normal heart sounds.   Pulmonary/Chest: Effort normal and breath sounds normal. No respiratory distress. He has no wheezes.  Musculoskeletal: Normal range of motion.  Left arm in a short arm cast, no noted swelling of the hand or forearm, full range of motion of all fingers, normal sensation throughout arm  Neurological: He is alert and oriented to person, place, and time.  Skin: Skin is warm and dry.  Psychiatric: He has a normal mood and affect.    ED Course  Procedures (including critical care time) Labs Review Labs Reviewed - No data to display  Imaging Review No results found.   EKG Interpretation  None      MDM   Final diagnoses:  Cast removal   53 y.o. M requesting cast removal 2 weeks prior to scheduled removal.  Discussed with patient that if we remove cast early, arm may not be fully healed and may cause further damage and worsen his overall condition.  He acknowledged these risks and is adamant that he wants his cast off.  Cast was removed by Orthopedic tech, no evidence of compartment syndrome.  Patient was placed in wrist splint.  He was encouraged to follow-up with his orthopedist on 04/21/14 as previously scheduled.   Recommended modified use of left arm until follow-up and cleared by surgeon.  Discussed plan with patient, he/she acknowledged understanding and agreed with plan of care.  Return precautions given for new or worsening symptoms.  Garlon HatchetLisa M Virginie Josten, PA-C 04/09/14 [id number]

## 2014-04-09 NOTE — ED Notes (Signed)
Pt verbalized understanding to this RN, Misty StanleyLisa PA, and [employer] EDP that the cast should not be removed until the 14th and that he should not utilize his L arm, until he follows up with his MD.  He understands that removing the cast could cause further damage, but would still like it removed.

## 2014-06-15 ENCOUNTER — Encounter (HOSPITAL_COMMUNITY): Payer: Self-pay | Admitting: Emergency Medicine

## 2014-06-15 ENCOUNTER — Emergency Department (HOSPITAL_COMMUNITY)
Admission: EM | Admit: 2014-06-15 | Discharge: 2014-06-15 | Disposition: A | Discharge status: Home / Self Care | Payer: PRIVATE HEALTH INSURANCE | Attending: Emergency Medicine | Admitting: Emergency Medicine

## 2014-06-15 DIAGNOSIS — Z720 Tobacco use: Secondary | ICD-10-CM | POA: Diagnosis not present

## 2014-06-15 DIAGNOSIS — M1990 Unspecified osteoarthritis, unspecified site: Secondary | ICD-10-CM | POA: Insufficient documentation

## 2014-06-15 DIAGNOSIS — M549 Dorsalgia, unspecified: Secondary | ICD-10-CM | POA: Insufficient documentation

## 2014-06-15 DIAGNOSIS — R635 Abnormal weight gain: Secondary | ICD-10-CM | POA: Insufficient documentation

## 2014-06-15 DIAGNOSIS — M25561 Pain in right knee: Secondary | ICD-10-CM | POA: Diagnosis not present

## 2014-06-15 DIAGNOSIS — F319 Bipolar disorder, unspecified: Secondary | ICD-10-CM | POA: Diagnosis not present

## 2014-06-15 DIAGNOSIS — E780 Pure hypercholesterolemia: Secondary | ICD-10-CM | POA: Diagnosis not present

## 2014-06-15 DIAGNOSIS — Z791 Long term (current) use of non-steroidal anti-inflammatories (NSAID): Secondary | ICD-10-CM | POA: Insufficient documentation

## 2014-06-15 DIAGNOSIS — Z7982 Long term (current) use of aspirin: Secondary | ICD-10-CM | POA: Diagnosis not present

## 2014-06-15 DIAGNOSIS — M25562 Pain in left knee: Secondary | ICD-10-CM | POA: Diagnosis not present

## 2014-06-15 NOTE — ED Provider Notes (Signed)
CSN: [id number]     Arrival date & time 06/15/14  0709 History   First MD Initiated Contact with Patient 06/15/14 [phone]     Chief Complaint  Patient presents with  . Nausea  . Knee Pain     (Consider location/radiation/quality/duration/timing/severity/associated sxs/prior Treatment) The history is provided by the patient. No language interpreter was used.  Peter Roth is a 53 y/o M with PMHx of high cholesterol and bipolar disorder type I presenting to the ED with request for weight loss. Patient reported that he has gained weight over the past couple of months and would like to get a weight loss pill so that he can lose weight. Patient reported that his weight is what is leading to his bilateral knee pain and lower back pain. Reported that the knee pain is an intermittent dull pain that is worse with standing for long periods of time. Stated that his lower back is a mild sharp pain. Stated that he has been dealing with back pain and bilateral knee pain for a many years - reported that he had bilateral knee surgery with a scope to clean out the joint. Patient reported that he saw his PCP regarding this issue and stated that his PCP did not address the weight gain. Patient reported that he would like to be admitted to the hospital for weight loss. Denied fever, chills, chest pain, shortness of breath, difficulty breathing, fall, injuries, numbness, tingling, loss of sensation, urinary and bowel incontinence, nausea, vomiting, diarrhea, melena, hematochezia, abdominal pain, unsteady gait, dizziness, headaches, SI/HI, depression.  PCP Dr. Brayton CavesBosa  Past Medical History  Diagnosis Date  . Bipolar 1 disorder   . High cholesterol    History reviewed. No pertinent past surgical history. History reviewed. No pertinent family history. History  Substance Use Topics  . Smoking status: Current Every Day Smoker -- 0.50 packs/day  . Smokeless tobacco: Not on file  . Alcohol Use: No    Review of  Systems  Constitutional: Negative for fever and chills.  Eyes: Negative for visual disturbance.  Respiratory: Negative for chest tightness and shortness of breath.   Cardiovascular: Negative for chest pain.  Gastrointestinal: Negative for nausea, vomiting and abdominal pain.  Genitourinary: Negative for decreased urine volume.  Musculoskeletal: Positive for back pain and arthralgias (bilateral knee pain).  Neurological: Negative for dizziness, weakness, numbness and headaches.      Allergies  Sulfa antibiotics  Home Medications   Prior to Admission medications   Medication Sig Start Date End Date Taking? Authorizing Provider  aspirin EC 81 MG tablet Take 81 mg by mouth daily.    Historical Provider, MD  atorvastatin (LIPITOR) 20 MG tablet Take 20 mg by mouth daily.    Historical Provider, MD  divalproex (DEPAKOTE ER) 500 MG 24 hr tablet Take 1,000 mg by mouth at bedtime.    Historical Provider, MD  meloxicam (MOBIC) 15 MG tablet Take 1 tablet by mouth daily. 02/01/14   Historical Provider, MD  sertraline (ZOLOFT) 100 MG tablet Take 1 tablet by mouth daily. 02/27/14   Historical Provider, MD  triamterene-hydrochlorothiazide (MAXZIDE-25) 37.5-25 MG per tablet Take 1 tablet by mouth daily.    Historical Provider, MD   BP 158/93 mmHg  Pulse 87  Temp(Src) 97.8 F (36.6 C) (Oral)  Resp 16  SpO2 100% Physical Exam  Constitutional: He is oriented to person, place, and time. He appears well-developed and well-nourished. No distress.  HENT:  Head: Normocephalic and atraumatic.  Eyes: Conjunctivae and EOM  are normal. Pupils are equal, round, and reactive to light. Right eye exhibits no discharge. Left eye exhibits no discharge.  Neck: Normal range of motion. Neck supple. No tracheal deviation present.  Negative neck stiffness Negative nuchal rigidity  Negative pain upon palpation to the c-spine  Cardiovascular: Normal rate, regular rhythm and normal heart sounds.  Exam reveals no  friction rub.   No murmur heard. Cap refill < 3 seconds Negative swelling or pitting edema noted to the lower extremities bilaterally   Pulmonary/Chest: Effort normal and breath sounds normal. No respiratory distress. He has no wheezes. He has no rales.  Patient is able to speak in full sentences without difficulty   Musculoskeletal: Normal range of motion. He exhibits no edema or tenderness.  Negative swelling, erythema, warmth upon palpation, red streaks, malalignments, deformities, abnormalities identified to the knees bilaterally. Negative pain upon palpation. Full range of motion to the knees bilaterally.  Negative deformities identified to the spine. Negative pain upon palpation.  Full ROM to upper and lower extremities without difficulty noted, negative ataxia noted.  Lymphadenopathy:    He has no cervical adenopathy.  Neurological: He is alert and oriented to person, place, and time. No cranial nerve deficit. He exhibits normal muscle tone. Coordination normal.  Cranial nerves III-XII grossly intact Strength 5+/5+ to upper and lower extremities bilaterally with resistance applied, equal distribution noted Equal grip strength bilaterally Sensation intact with differentiation to sharp and dull touch Negative saddle paresthesias bilaterally Negative arm drift Fine motor skills intact Gait proper, proper balance - negative sway, negative drift, negative step-offs Negative facial drooping Negative slurred speech Negative aphasia Patient follows commands well Patient responds to questions appropriately  Skin: Skin is warm and dry. No rash noted. He is not diaphoretic. No erythema.  Psychiatric: He has a normal mood and affect. His behavior is normal. Thought content normal. His mood appears not anxious. He is not aggressive and not actively hallucinating. Thought content is not paranoid and not delusional. He expresses no homicidal and no suicidal ideation. He expresses no suicidal  plans and no homicidal plans.  Pleasant   Nursing note and vitals reviewed.   ED Course  Procedures (including critical care time) Labs Review Labs Reviewed - No data to display  Imaging Review No results found.   EKG Interpretation None      7:39 AM Patient requesting to see attending physician regarding weight loss options, Dr. Casper HarrisonM. Linker to see.  MDM   Final diagnoses:  Weight gain  Arthritis  Bilateral knee pain    Medications - No data to display  Filed Vitals:   06/15/14 0715 06/15/14 0719  BP: 158/93   Pulse:  87  Temp:  97.8 F (36.6 C)  TempSrc:  Oral  Resp:  16  SpO2:  100%   Patient presenting to the ED with request for weight loss pills and or admission for weight loss. Patient has history of bipolar and this appears to be what is occuring. Patient denied SI, HI, depression. Stated that he has been taking his medications as prescribed. Discussed with patient that the ED does not prescribe weight loss pills nor does the ED admit patients for weight loss. Discussed with patient proper dieting and calorie counting as options. Patient spoke with attending physician who reiterated the same. Recommended patient to follow up with PCP regarding these issues and to discuss weight loss options. Doubt septic joint. Doubt cauda equina. Doubt epidural abscess. Doubt compartment syndrome. Doubt acute injuries - no  imaging needed at this time. Patient does not appear a harm to self or others. Patient seen and assessed by attending physician who agrees to plan of discharge. Patient stable, afebrile. Patient not septic appearing. Negative signs of ischemia. Negative signs of respiratory distress. Negative focal neurological deficits. Pulses palpable and strong. Gait proper with negative step offs or red flags. Discharged patient. Discussed with patient to closely monitor symptoms and if symptoms are to worsen or change to report back to the ED - strict return instructions given.   Patient agreed to plan of care, understood, all questions answered.   Raymon Mutton, PA-C 06/15/14 [id number]  Ethelda Chick, MD 06/15/14 [phone]

## 2014-06-15 NOTE — Discharge Instructions (Signed)
Please call your doctor for a followup appointment within 24-48 hours. When you talk to your doctor please let them know that you were seen in the emergency department and have them acquire all of your records so that they can discuss the findings with you and formulate a treatment plan to fully care for your new and ongoing problems. Please call and set-up an appointment with your primary care provider to discuss weight loss options Please continue to monitor symptoms closely and if symptoms are to worsen or change (fever greater than 101, chills, sweating, nausea, vomiting, chest pain, shortness of breathe, difficulty breathing, weakness, numbness, tingling, worsening or changes to pain pattern, fall, injury, loss of sensation, inability to control urine and bowel movements, redness with swelling and hot to the touch of the knees) please report back to the Emergency Department immediately.  Arthritis, Nonspecific Arthritis is inflammation of a joint. This usually means pain, redness, warmth or swelling are present. One or more joints may be involved. There are a number of types of arthritis. Your caregiver may not be able to tell what type of arthritis you have right away. CAUSES  The most common cause of arthritis is the wear and tear on the joint (osteoarthritis). This causes damage to the cartilage, which can break down over time. The knees, hips, back and neck are most often affected by this type of arthritis. Other types of arthritis and common causes of joint pain include:  Sprains and other injuries near the joint. Sometimes minor sprains and injuries cause pain and swelling that develop hours later.  Rheumatoid arthritis. This affects hands, feet and knees. It usually affects both sides of your body at the same time. It is often associated with chronic ailments, fever, weight loss and general weakness.  Crystal arthritis. Gout and pseudo gout can cause occasional acute severe pain, redness  and swelling in the foot, ankle, or knee.  Infectious arthritis. Bacteria can get into a joint through a break in overlying skin. This can cause infection of the joint. Bacteria and viruses can also spread through the blood and affect your joints.  Drug, infectious and allergy reactions. Sometimes joints can become mildly painful and slightly swollen with these types of illnesses. SYMPTOMS   Pain is the main symptom.  Your joint or joints can also be red, swollen and warm or hot to the touch.  You may have a fever with certain types of arthritis, or even feel overall ill.  The joint with arthritis will hurt with movement. Stiffness is present with some types of arthritis. DIAGNOSIS  Your caregiver will suspect arthritis based on your description of your symptoms and on your exam. Testing may be needed to find the type of arthritis:  Blood and sometimes urine tests.  X-ray tests and sometimes CT or MRI scans.  Removal of fluid from the joint (arthrocentesis) is done to check for bacteria, crystals or other causes. Your caregiver (or a specialist) will numb the area over the joint with a local anesthetic, and use a needle to remove joint fluid for examination. This procedure is only minimally uncomfortable.  Even with these tests, your caregiver may not be able to tell what kind of arthritis you have. Consultation with a specialist (rheumatologist) may be helpful. TREATMENT  Your caregiver will discuss with you treatment specific to your type of arthritis. If the specific type cannot be determined, then the following general recommendations may apply. Treatment of severe joint pain includes:  Rest.  Elevation.  Anti-inflammatory medication (for example, ibuprofen) may be prescribed. Avoiding activities that cause increased pain.  Only take over-the-counter or prescription medicines for pain and discomfort as recommended by your caregiver.  Cold packs over an inflamed joint may be  used for 10 to 15 minutes every hour. Hot packs sometimes feel better, but do not use overnight. Do not use hot packs if you are diabetic without your caregiver's permission.  A cortisone shot into arthritic joints may help reduce pain and swelling.  Any acute arthritis that gets worse over the next 1 to 2 days needs to be looked at to be sure there is no joint infection. Long-term arthritis treatment involves modifying activities and lifestyle to reduce joint stress jarring. This can include weight loss. Also, exercise is needed to nourish the joint cartilage and remove waste. This helps keep the muscles around the joint strong. HOME CARE INSTRUCTIONS   Do not take aspirin to relieve pain if gout is suspected. This elevates uric acid levels.  Only take over-the-counter or prescription medicines for pain, discomfort or fever as directed by your caregiver.  Rest the joint as much as possible.  If your joint is swollen, keep it elevated.  Use crutches if the painful joint is in your leg.  Drinking plenty of fluids may help for certain types of arthritis.  Follow your caregiver's dietary instructions.  Try low-impact exercise such as:  Swimming.  Water aerobics.  Biking.  Walking.  Morning stiffness is often relieved by a warm shower.  Put your joints through regular range-of-motion. SEEK MEDICAL CARE IF:   You do not feel better in 24 hours or are getting worse.  You have side effects to medications, or are not getting better with treatment. SEEK IMMEDIATE MEDICAL CARE IF:   You have a fever.  You develop severe joint pain, swelling or redness.  Many joints are involved and become painful and swollen.  There is severe back pain and/or leg weakness.  You have loss of bowel or bladder control. Document Released: 08/02/2004 Document Revised: 09/17/2011 Document Reviewed: 08/18/2008 [facility] Patient Information 2015 [place], [place]. This information is not intended to  replace advice given to you by your health care provider. Make sure you discuss any questions you have with your health care provider.    Emergency Department Resource Guide 1) Find a Doctor and Pay Out of Pocket Although you won't have to find out who is covered by your insurance plan, it is a good idea to ask around and get recommendations. You will then need to call the office and see if the doctor you have chosen will accept you as a new patient and what types of options they offer for patients who are self-pay. Some doctors offer discounts or will set up payment plans for their patients who do not have insurance, but you will need to ask so you aren't surprised when you get to your appointment.  2) Contact Your Local Health Department Not all health departments have doctors that can see patients for sick visits, but many do, so it is worth a call to see if yours does. If you don't know where your local health department is, you can check in your phone book. The CDC also has a tool to help you locate your state's health department, and many state websites also have listings of all of their local health departments.  3) Find a Walk-in Clinic If your illness is not likely to be very severe or complicated, you may  want to try a walk in clinic. These are popping up all over the country in pharmacies, drugstores, and shopping centers. They're usually staffed by nurse practitioners or physician assistants that have been trained to treat common illnesses and complaints. They're usually fairly quick and inexpensive. However, if you have serious medical issues or chronic medical problems, these are probably not your best option.  No Primary Care Doctor: - Call Health Connect at  [phone] - they can help you locate a primary care doctor that  accepts your insurance, provides certain services, etc. - Physician Referral Service- [phone]  Chronic Pain Problems: Organization         Address  Phone    Notes  [facility]  [phone] Patients need to be referred by their primary care doctor.   Medication Assistance: Organization         Address  Phone   Notes  [facility] Medication [facility] [address] [place] [place], [place] [address] [phone] --Must be a resident of [facility] -- Must have NO insurance coverage whatsoever (no Medicaid/ Medicare, etc.) -- The pt. MUST have a primary care doctor that directs their care regularly and follows them in the community   [facility]ist  [phone]   [employer]  [phone]    Agencies that provide inexpensive medical care: Organization         Address  Phone   Notes  [facility]  [phone]   [facility]    [phone]   [facility] [place] [address] [place], [place] [address] [phone]   [facility] [address] [place], [place] [phone]   Planned Parenthood    [phone]   [facility]    [phone]   [facility] and [facility]  [address], [place] Phone:  [phone], Fax:  [phone] Hours of Operation:  9 am - 6 pm, M-F.  Also accepts Medicaid/Medicare and self-pay.  [facility]  [address], [place] Phone: [phone], Fax: [phone]. Hours of Operation:  8:30 am - 5:30 pm, M-F.  Also accepts Medicaid and self-pay.  [facility] [place] [address], [place] Phone: [phone]   [facility] [address] Natasha Bence [place], [place] [phone], Ext. [phone] Mondays & Thursdays: 7-9 AM.  First 15 patients are seen on a first come, first serve basis.    Medicaid-accepting [facility] Providers:  Organization         Address  Phone   Notes  [facility] [address], [place] [phone] Also accepts self-pay patients.  [facility]  [address] Laurell Josephs [place], [place]  [phone]   [facility] [address], [place] [phone]   [facility] [address] [place] [phone]   Renaye Rakers [address], [place]   [phone] Only accepts [place] patients after they have their name applied to their card.   Self-Pay (no insurance) in Outpatient Plastic Surgery Center:  Organization         Address  Phone   Notes  Sickle Cell Patients, [facility] [address] [place], [place] [phone]   [facility] [address] [place], [place] [phone])  [phone]   [facility] [place]  [address], [place] [phone]   [facility]/Dr. Osei-Bonsu  [address], [place] or [address], [place] [phone] Phone number for both [place] and [place] locations is the same.  [facility] [address], [place] [phone]   [facility] [address] [place] or [address] [phone]   [facility] [address], [place] [phone], phone; [phone], fax Sees patients 1st and 3rd Saturday of every month.  Must not qualify for public or private insurance (i.e. Medicaid, Medicare, [place] Health Choice, Veterans' Benefits)  Household income should be no more than 200% of the poverty level The clinic cannot treat you if you are pregnant or think you are pregnant  Sexually transmitted diseases are not treated at the clinic.    Dental Care: Organization         Address  Phone  Notes  [facility] Department of [facility] [address] [place], [place] [phone] Accepts children up to age 56 who are enrolled in [place] or [place] Health Choice; pregnant women with a Medicaid card; and children who have  applied for Medicaid or [place] Health Choice, but were declined, whose parents can pay a reduced fee at time of service.  [facility]  [address], [place] [phone] Accepts children up to age 3 who are enrolled in [place] or [place] Health Choice; pregnant women with a Medicaid card; and children who have applied for Medicaid or [place] Health Choice, but were declined, whose parents can pay a reduced fee at time of service.  [facility]  [address] [place], [place] [phone] Patients are seen by appointment only. Walk-ins are not accepted. [facility] will see patients 3 years of age and older. Monday - Tuesday (8am-5pm) Most Wednesdays (8:30-5pm) $30 per visit, cash only  [facility]  [address], [facility] [phone] Patients are seen by appointment only. Walk-ins are not accepted. [facility] will see patients 57 years of age and older. One Wednesday Evening (Monthly: Volunteer Based).  $30 per visit, cash only  [employer]  [phone] for adults; Children under age 29, call Graduate Pediatric Dentistry at [phone]. Children aged 44-14, please call [phone] to request a pediatric application.  Dental services are provided in all areas of dental care including fillings, crowns and bridges, complete and partial dentures, implants, gum treatment, root canals, and extractions. Preventive care is also provided. Treatment is provided to both adults and children. Patients are selected via a lottery and there is often a waiting list.   [facility] [address] [place]  [phone] [email or web address]   [facility] [address] [place], [place] [phone], Ext. [phone] Second and Fourth Thursday of each month, opens at 6:30 AM; Clinic ends at 9 AM.  Patients are seen on a first-come first-served basis, and a  limited number are seen during each clinic.   [facility]  [address] Ether Griffins [place], [place] [phone]   Eligibility Requirements You must have lived in [place], [place], or [place] counties for at least the last three months.   You cannot be eligible for state  or Teacher, musicfederal sponsored healthcare insurance, including CIGNAVeterans Administration, [place]icaid, or [employer]icare.   You generally cannot be eligible for healthcare insurance through your employer.    How to apply: Eligibility screenings are held every Tuesday and Wednesday afternoon from 1:00 pm until 4:00 pm. You do not need an appointment for the interview!  [facility] [address], [place], [place] [phone]   [facility]  [phone]   [facility]  [phone]   [facility]  [phone]    Behavioral Health Resources in the Community: Intensive Outpatient Programs Organization         Address  Phone  Notes  [facility] [address], [place], [place] [phone]   [facility] [address], [place], [place] [phone]   [facility] [address], [place], [place]  [phone]   [facility] [address],  [place], [place] [phone] or [phone]   Substance Abuse Resources Organization         Address  Phone  Notes  [facility]  [phone]   [facility]  [phone]   [facility]  [phone]   [facility]  [phone]   Residential & Outpatient Substance Abuse Program  [phone]   Psychological Services Organization         Address  Phone  Notes  [facility]  [phone]- [phone]   [facility]  [phone]- [phone]   [facility] [address], [place] [phone] or [phone]    Mobile Crisis Teams Organization          Address  Phone  Notes  [facility], Mobile Crisis Care Unit  [phone]   [facility]  [address] [place], [place] [phone]   Doristine LocksSharon DeEsch [address] [place] [phone]    Self-Help/Support Groups Organization         Address  Phone             Notes  [facility] [place] - variety of support groups  [phone]- [phone] Call for more information  Narcotics Anonymous (NA), [facility] [address], [place]  2 meetings at this location   Statisticianesidential Treatment Programs Organization         Address  Phone  Notes  [facility] [address],    [place]  [phone]   [facility]  [address], [place] [phone], [place], [place] [phone]   [facility] [address], [place] [phone] Admissions: 8am-3pm M-F  [facility] [address]    [place], [place] [phone]   [facility] [address], [place], [place] [phone]   [facility] [address]  [place], [place] [phone]   [facility] - Intensive Outpatient [address], [place], [place] [phone]   [facility] [address]  [place], [place] [phone] or [phone]   [facility] ([facility]) [address] [place], [place] [phone] Accepts Medicaid  [facility] [address]  [place] [phone] Substance Abuse/Addiction Treatment   [facility] Resources Organization         Address  Phone  Notes  [facility]  [phone]) [phone]   Angie FavaJulie Brannon, PhD [address] [place], [place]   [phone]  [phone] or [phone]) [phone]   [facility]   [address] [place], [place] [phone]   [facility] [address] [place], [place] [phone] Insurance/Medicaid/sponsorship  through [facility] and Families [address]                                    [place], [place] [phone] Therapy/tele-psych/case  [facility] [address]   [place], [place] [phone]    Dr. Lolly Mustache  [phone]   [facility] [place]  [employer] [facility] Dept. 1) [address] [place] 2) [address] [place] 3)  [address], [place] [phone]  [phone]   [facility] [place] Child Abuse Hotline [phone] or [phone] (After Hours)

## 2014-06-15 NOTE — ED Notes (Signed)
Pt states he has been on a new diet to try to lose weight, hoping to get help losing weight. Pt states he is nauseated, has knee pain.

## 2014-06-15 NOTE — ED Notes (Signed)
PA at bedside.

## 2014-12-17 ENCOUNTER — Encounter (HOSPITAL_COMMUNITY): Payer: Self-pay | Admitting: Emergency Medicine

## 2014-12-17 ENCOUNTER — Emergency Department (HOSPITAL_COMMUNITY)
Admission: EM | Admit: 2014-12-17 | Discharge: 2014-12-17 | Disposition: A | Discharge status: Home / Self Care | Payer: Medicare Other | Attending: Emergency Medicine | Admitting: Emergency Medicine

## 2014-12-17 DIAGNOSIS — F319 Bipolar disorder, unspecified: Secondary | ICD-10-CM | POA: Insufficient documentation

## 2014-12-17 DIAGNOSIS — Z720 Tobacco use: Secondary | ICD-10-CM | POA: Diagnosis not present

## 2014-12-17 DIAGNOSIS — Z7982 Long term (current) use of aspirin: Secondary | ICD-10-CM | POA: Insufficient documentation

## 2014-12-17 DIAGNOSIS — E780 Pure hypercholesterolemia: Secondary | ICD-10-CM | POA: Insufficient documentation

## 2014-12-17 DIAGNOSIS — Z79899 Other long term (current) drug therapy: Secondary | ICD-10-CM | POA: Diagnosis not present

## 2014-12-17 DIAGNOSIS — Z791 Long term (current) use of non-steroidal anti-inflammatories (NSAID): Secondary | ICD-10-CM | POA: Diagnosis not present

## 2014-12-17 DIAGNOSIS — N492 Inflammatory disorders of scrotum: Secondary | ICD-10-CM | POA: Diagnosis present

## 2014-12-17 DIAGNOSIS — L0291 Cutaneous abscess, unspecified: Secondary | ICD-10-CM

## 2014-12-17 NOTE — ED Notes (Signed)
Per patient, noticed abscess near testicle last night-wants it drained

## 2014-12-17 NOTE — Discharge Instructions (Signed)
Abscess An abscess is an infected area that contains a collection of pus and debris.It can occur in almost any part of the body. An abscess is also known as a furuncle or boil. CAUSES  An abscess occurs when tissue gets infected. This can occur from blockage of oil or sweat glands, infection of hair follicles, or a minor injury to the skin. As the body tries to fight the infection, pus collects in the area and creates pressure under the skin. This pressure causes pain. People with weakened immune systems have difficulty fighting infections and get certain abscesses more often.  SYMPTOMS Usually an abscess develops on the skin and becomes a painful mass that is red, warm, and tender. If the abscess forms under the skin, you may feel a moveable soft area under the skin. Some abscesses break open (rupture) on their own, but most will continue to get worse without care. The infection can spread deeper into the body and eventually into the bloodstream, causing you to feel ill.  DIAGNOSIS  Your caregiver will take your medical history and perform a physical exam. A sample of fluid may also be taken from the abscess to determine what is causing your infection. TREATMENT  Your caregiver may prescribe antibiotic medicines to fight the infection. However, taking antibiotics alone usually does not cure an abscess. Your caregiver may need to make a small cut (incision) in the abscess to drain the pus. In some cases, gauze is packed into the abscess to reduce pain and to continue draining the area. HOME CARE INSTRUCTIONS   Only take over-the-counter or prescription medicines for pain, discomfort, or fever as directed by your caregiver.  If you were prescribed antibiotics, take them as directed. Finish them even if you start to feel better.  If gauze is used, follow your caregiver's directions for changing the gauze.  To avoid spreading the infection:  Keep your draining abscess covered with a  bandage.  Wash your hands well.  Do not share personal care items, towels, or whirlpools with others.  Avoid skin contact with others.  Keep your skin and clothes clean around the abscess.  Keep all follow-up appointments as directed by your caregiver. SEEK MEDICAL CARE IF:   You have increased pain, swelling, redness, fluid drainage, or bleeding.  You have muscle aches, chills, or a general ill feeling.  You have a fever. MAKE SURE YOU:   Understand these instructions.  Will watch your condition.  Will get help right away if you are not doing well or get worse. Document Released: 04/04/2005 Document Revised: 12/25/2011 Document Reviewed: 09/07/2011 [employer] [facility] Patient Information 2015 [place], [place]. This information is not intended to replace advice given to you by your health care provider. Make sure you discuss any questions you have with your health care provider.  Please read above information. Please use warm compresses on affected site, avoid using hot water. Monitor for new or worsening signs or symptoms, please follow up with [place] and wellness in three days for recheck of symptoms. Return sooner if needed.

## 2014-12-17 NOTE — ED Provider Notes (Signed)
CSN: [id number]     Arrival date & time 12/17/14  1004 History   First MD Initiated Contact with Patient 12/17/14 1017     Chief Complaint  Patient presents with  . Abscess    HPI    54 year old male presents with a firm nodule to scrotum. Patient reports that last night lifting a shower felt the nodule, reports it is minimally tender, reports using "oil" on it to soften it up. Patient reports a history of abscesses, noting to previous, no complications treated with incision and drainage. Patient denies a history of MRSA, reports that he is diabetic last CBG reading of 126 this morning, patient reports he smokes. Patient denies headache, fever, nausea, vomiting, abdominal pain, testicular pain, signs of surrounding infection, or any other concerning signs or symptoms. Patient reports that he is going to the beach this weekend and would like it drained so that he can "go swimming".     Past Medical History  Diagnosis Date  . Bipolar 1 disorder   . High cholesterol    History reviewed. No pertinent past surgical history. No family history on file. History  Substance Use Topics  . Smoking status: Current Every Day Smoker -- 0.50 packs/day  . Smokeless tobacco: Not on file  . Alcohol Use: No    Review of Systems  All other systems reviewed and are negative.   Allergies  Sulfa antibiotics  Home Medications   Prior to Admission medications   Medication Sig Start Date End Date Taking? Authorizing Provider  aspirin EC 81 MG tablet Take 81 mg by mouth daily.    Historical Provider, MD  atorvastatin (LIPITOR) 20 MG tablet Take 20 mg by mouth daily.    Historical Provider, MD  divalproex (DEPAKOTE ER) 500 MG 24 hr tablet Take 1,000 mg by mouth at bedtime.    Historical Provider, MD  meloxicam (MOBIC) 15 MG tablet Take 1 tablet by mouth daily. 02/01/14   Historical Provider, MD  sertraline (ZOLOFT) 100 MG tablet Take 1 tablet by mouth daily. 02/27/14   Historical Provider, MD   triamterene-hydrochlorothiazide (MAXZIDE-25) 37.5-25 MG per tablet Take 1 tablet by mouth daily.    Historical Provider, MD   BP 122/67 mmHg  Pulse 77  Temp(Src) 97.9 F (36.6 C) (Oral)  Resp 16  SpO2 100% Physical Exam  Constitutional: He is oriented to person, place, and time. He appears well-developed and well-nourished.  HENT:  Head: Normocephalic and atraumatic.  Eyes: Conjunctivae are normal. Pupils are equal, round, and reactive to light. Right eye exhibits no discharge. Left eye exhibits no discharge. No scleral icterus.  Neck: Normal range of motion. No JVD present. No tracheal deviation present.  Cardiovascular: Normal rate.   Pulmonary/Chest: Effort normal. No stridor.  Abdominal: Soft. He exhibits no distension and no mass. There is no tenderness. There is no rebound and no guarding.  Genitourinary: Testes normal and penis normal. Right testis shows no mass, no swelling and no tenderness. Left testis shows no mass, no swelling and no tenderness.  .5 cm firm nodule to inferior scrotum. Mobile, minimally  tender, no fluctuance, no signs of surrounding cellulitis or infection. Scrotum normal  Neurological: He is alert and oriented to person, place, and time. Coordination normal.  Psychiatric: He has a normal mood and affect. His behavior is normal. Judgment and thought content normal.  Nursing note and vitals reviewed.   ED Course  Procedures (including critical care time) Labs Review Labs Reviewed - No data to display  Imaging Review No results found.   EKG Interpretation None      MDM   Final diagnoses:  Abscess    Labs: None  Imaging: None  Consults: None  Therapeutics: None  Assessment: Abscess  Plan: Patient presents with a very small likely developing abscess. No signs of surrounding infection, minimally tender to palpation, no other signs of systemic infection. Abscess is nonfluctuant at this time and very minimal, incision and drainage at this  time would likely be unproductive and not assisted management of this. Patient will be instructed to use warm compresses, monitor for new or worsening signs or symptoms, and follow-up with [facility] in 3 days for reevaluation, or sooner if needed. His understanding and agreement to today's plan and had no further questions concerns at the time of discharge.      Eyvonne Mechanic, PA-C 12/17/14 1039  Purvis Sheffield, MD 12/17/14 [phone]

## 2015-03-02 ENCOUNTER — Encounter (HOSPITAL_COMMUNITY): Payer: Self-pay | Admitting: Emergency Medicine

## 2015-03-02 ENCOUNTER — Emergency Department (HOSPITAL_COMMUNITY)
Admission: EM | Admit: 2015-03-02 | Discharge: 2015-03-02 | Disposition: A | Discharge status: Home / Self Care | Payer: Medicare Other | Attending: Emergency Medicine | Admitting: Emergency Medicine

## 2015-03-02 DIAGNOSIS — F319 Bipolar disorder, unspecified: Secondary | ICD-10-CM | POA: Insufficient documentation

## 2015-03-02 DIAGNOSIS — E780 Pure hypercholesterolemia: Secondary | ICD-10-CM | POA: Diagnosis not present

## 2015-03-02 DIAGNOSIS — S46911A Strain of unspecified muscle, fascia and tendon at shoulder and upper arm level, right arm, initial encounter: Secondary | ICD-10-CM | POA: Diagnosis not present

## 2015-03-02 DIAGNOSIS — X58XXXA Exposure to other specified factors, initial encounter: Secondary | ICD-10-CM | POA: Insufficient documentation

## 2015-03-02 DIAGNOSIS — Z791 Long term (current) use of non-steroidal anti-inflammatories (NSAID): Secondary | ICD-10-CM | POA: Diagnosis not present

## 2015-03-02 DIAGNOSIS — S4991XA Unspecified injury of right shoulder and upper arm, initial encounter: Secondary | ICD-10-CM | POA: Diagnosis present

## 2015-03-02 DIAGNOSIS — Z720 Tobacco use: Secondary | ICD-10-CM | POA: Diagnosis not present

## 2015-03-02 DIAGNOSIS — Z7982 Long term (current) use of aspirin: Secondary | ICD-10-CM | POA: Diagnosis not present

## 2015-03-02 DIAGNOSIS — Z79899 Other long term (current) drug therapy: Secondary | ICD-10-CM | POA: Diagnosis not present

## 2015-03-02 DIAGNOSIS — Y998 Other external cause status: Secondary | ICD-10-CM | POA: Insufficient documentation

## 2015-03-02 DIAGNOSIS — Y9289 Other specified places as the place of occurrence of the external cause: Secondary | ICD-10-CM | POA: Insufficient documentation

## 2015-03-02 DIAGNOSIS — T148XXA Other injury of unspecified body region, initial encounter: Secondary | ICD-10-CM

## 2015-03-02 DIAGNOSIS — Y9389 Activity, other specified: Secondary | ICD-10-CM | POA: Insufficient documentation

## 2015-03-02 MED ORDER — IBUPROFEN 200 MG PO TABS
600.0000 mg | ORAL_TABLET | Freq: Once | ORAL | Status: AC
  Administered 2015-03-02: 600 mg via ORAL
  Filled 2015-03-02: qty 3

## 2015-03-02 MED ORDER — IBUPROFEN 600 MG PO TABS: 600.0000 mg | ORAL_TABLET | Freq: Four times a day (QID) | ORAL | Status: DC | PRN

## 2015-03-02 NOTE — ED Notes (Signed)
Pt states he thinks he may have pulled a muscle in the back of his right arm  Pt states he has had pain for the past 2 days  Pt states he has been putting ice on it but it is not helping

## 2015-03-02 NOTE — ED Provider Notes (Signed)
CSN: [id number]   Arrival date & time 03/02/15 0237  History  This chart was scribed for Loren Racer, MD by Bethel Born, ED Scribe. This patient was seen in room WA15/WA15 and the patient's care was started at 3:08 AM.  Chief Complaint  Patient presents with  . Arm Pain    HPI The history is provided by the patient. No language interpreter was used.   Peter Roth is a 54 y.o. male who presents to the Emergency Department complaining of constant upper right arm pain with gradual onset 2 days ago after stopping himself from falling with the right arm. He describes the pain as sharp and rates it 5/10 in severity. Icy Hot and heat application provided insufficient pain relief PTA. No numbness or weakness.  Past Medical History  Diagnosis Date  . Bipolar 1 disorder   . High cholesterol     History reviewed. No pertinent past surgical history.  History reviewed. No pertinent family history.  Social History  Substance Use Topics  . Smoking status: Current Every Day Smoker -- 0.50 packs/day  . Smokeless tobacco: None  . Alcohol Use: No     Review of Systems  Constitutional: Negative for fever and chills.  Respiratory: Negative for shortness of breath.   Cardiovascular: Negative for chest pain.  Musculoskeletal: Positive for myalgias. Negative for back pain, arthralgias and neck pain.  Skin: Negative for rash and wound.  Neurological: Negative for weakness and numbness.  All other systems reviewed and are negative.   Home Medications   Prior to Admission medications   Medication Sig Start Date End Date Taking? Authorizing Provider  aspirin EC 81 MG tablet Take 81 mg by mouth daily.   Yes Historical Provider, MD  atorvastatin (LIPITOR) 20 MG tablet Take 20 mg by mouth daily.   Yes Historical Provider, MD  divalproex (DEPAKOTE ER) 500 MG 24 hr tablet Take 1,000 mg by mouth at bedtime.   Yes Historical Provider, MD  meloxicam (MOBIC) 15 MG tablet Take 1 tablet by mouth  daily. 02/01/14  Yes Historical Provider, MD  sertraline (ZOLOFT) 100 MG tablet Take 1 tablet by mouth daily. 02/27/14  Yes Historical Provider, MD  triamterene-hydrochlorothiazide (MAXZIDE-25) 37.5-25 MG per tablet Take 1 tablet by mouth daily.   Yes Historical Provider, MD  ibuprofen (ADVIL,MOTRIN) 600 MG tablet Take 1 tablet (600 mg total) by mouth every 6 (six) hours as needed. 03/02/15   Loren Racer, MD    Allergies  Sulfa antibiotics  Triage Vitals: BP 143/99 mmHg  Pulse 87  Temp(Src) 97.6 F (36.4 C) (Oral)  Resp 21  SpO2 99%  Physical Exam  Constitutional: He is oriented to person, place, and time. He appears well-developed and well-nourished. No distress.  HENT:  Head: Normocephalic and atraumatic.  Mouth/Throat: Oropharynx is clear and moist.  Eyes: EOM are normal. Pupils are equal, round, and reactive to light.  Neck: Normal range of motion. Neck supple.  No posterior midline cervical tenderness to palpation.  Cardiovascular: Normal rate and regular rhythm.   Pulmonary/Chest: Effort normal and breath sounds normal.  Abdominal: Soft. Bowel sounds are normal.  Musculoskeletal: Normal range of motion. He exhibits tenderness. He exhibits no edema.  Patient with mild tenderness to palpation over the tricep and deltoid muscles of the right arm. Patient has no bony tenderness. Full range of motion of the right arm. Distal pulses intact.  Neurological: He is alert and oriented to person, place, and time.  5/5 motor and alternatives. Sensation is  fully intact.  Skin: Skin is warm and dry. No rash noted. No erythema.  Psychiatric: He has a normal mood and affect. His behavior is normal.  Nursing note and vitals reviewed.   ED Course  Procedures   DIAGNOSTIC STUDIES: Oxygen Saturation is 99% on RA, normal by my interpretation.    COORDINATION OF CARE: 3:10 AM Discussed treatment plan which includes ibuprofen with pt at bedside and pt agreed to plan.  Labs Reviewed - No  data to display  Imaging Review No results found.  EKG Interpretation None      MDM   Final diagnoses:  Muscle strain     I personally performed the services described in this documentation, which was scribed in my presence. The recorded information has been reviewed and is accurate.  Suspect muscle strain. Do not see evidence for bony injury. Don't believe imaging is necessary at this point. We'll treat with anti-inflammatory medication. Return precautions given.   Loren Racer, MD 03/02/15 [phone]

## 2015-03-02 NOTE — Discharge Instructions (Signed)

## 2015-03-05 ENCOUNTER — Encounter (HOSPITAL_COMMUNITY): Payer: Self-pay | Admitting: Emergency Medicine

## 2015-03-05 ENCOUNTER — Emergency Department (HOSPITAL_COMMUNITY): Payer: Medicare Other

## 2015-03-05 ENCOUNTER — Emergency Department (HOSPITAL_COMMUNITY)
Admission: EM | Admit: 2015-03-05 | Discharge: 2015-03-06 | Discharge status: Against Medical Advice | Payer: Medicare Other | Attending: Emergency Medicine | Admitting: Emergency Medicine

## 2015-03-05 DIAGNOSIS — M25511 Pain in right shoulder: Secondary | ICD-10-CM | POA: Insufficient documentation

## 2015-03-05 DIAGNOSIS — Z720 Tobacco use: Secondary | ICD-10-CM | POA: Diagnosis not present

## 2015-03-05 NOTE — ED Notes (Signed)
Pt from home c/o right shoulder pain x 1 week. Pt reports he was seen here on 8/24 for same.  Pt is requesting a cortisone shot in his shoulder. He also wants to have it done tomorrow and states "ya'll need to find a doctors office that is open tomorrow that will do it".

## 2015-03-05 NOTE — ED Notes (Addendum)
Pt reports he will just see his orthopedic on Monday. PT is alert and oriented upon leaving AMA

## 2015-05-29 ENCOUNTER — Emergency Department (HOSPITAL_COMMUNITY)
Admission: EM | Admit: 2015-05-29 | Discharge: 2015-05-29 | Disposition: A | Discharge status: Home / Self Care | Payer: Medicare Other | Attending: Emergency Medicine | Admitting: Emergency Medicine

## 2015-05-29 ENCOUNTER — Encounter (HOSPITAL_COMMUNITY): Payer: Self-pay | Admitting: Oncology

## 2015-05-29 DIAGNOSIS — F17200 Nicotine dependence, unspecified, uncomplicated: Secondary | ICD-10-CM | POA: Insufficient documentation

## 2015-05-29 DIAGNOSIS — Z791 Long term (current) use of non-steroidal anti-inflammatories (NSAID): Secondary | ICD-10-CM | POA: Diagnosis not present

## 2015-05-29 DIAGNOSIS — Z79899 Other long term (current) drug therapy: Secondary | ICD-10-CM | POA: Diagnosis not present

## 2015-05-29 DIAGNOSIS — L0291 Cutaneous abscess, unspecified: Secondary | ICD-10-CM

## 2015-05-29 DIAGNOSIS — F319 Bipolar disorder, unspecified: Secondary | ICD-10-CM | POA: Insufficient documentation

## 2015-05-29 DIAGNOSIS — Z7982 Long term (current) use of aspirin: Secondary | ICD-10-CM | POA: Insufficient documentation

## 2015-05-29 DIAGNOSIS — E7800 Pure hypercholesterolemia, unspecified: Secondary | ICD-10-CM | POA: Diagnosis not present

## 2015-05-29 DIAGNOSIS — L02214 Cutaneous abscess of groin: Secondary | ICD-10-CM | POA: Insufficient documentation

## 2015-05-29 MED ORDER — CEPHALEXIN 500 MG PO CAPS: 500.0000 mg | ORAL_CAPSULE | Freq: Four times a day (QID) | ORAL | Status: DC

## 2015-05-29 MED ORDER — LIDOCAINE HCL 1 % IJ SOLN
INTRAMUSCULAR | Status: AC
  Administered 2015-05-29: 07:00:00
  Filled 2015-05-29: qty 20

## 2015-05-29 NOTE — ED Provider Notes (Signed)
CSN: [id number]     Arrival date & time 05/29/15  8295 History   First MD Initiated Contact with Patient 05/29/15 [phone]     Chief Complaint  Patient presents with  . Abscess    HPI   Peter Roth is a 54 y.o. male with a PMH of bipolar disorder, HLD who presents to the ED with abscess to his left groin, which he states has been present and progressively grown in size x 4 days. He reports mild pain. He denies exacerbating factors. He states he has not tried anything for symptom relief. He denies drainage. He reports he thinks he "caught it early" this time, and states he has a history of abscesses that have required incision and drainage. He denies fever, chills, abdominal pain, N/V.   Past Medical History  Diagnosis Date  . Bipolar 1 disorder (HCC)   . High cholesterol    History reviewed. No pertinent past surgical history. No family history on file. Social History  Substance Use Topics  . Smoking status: Current Every Day Smoker -- 0.50 packs/day  . Smokeless tobacco: Never Used  . Alcohol Use: No     Review of Systems  Constitutional: Negative for fever and chills.  Gastrointestinal: Negative for nausea, vomiting and abdominal pain.  Skin:       Abscess to left groin.      Allergies  Sulfa antibiotics  Home Medications   Prior to Admission medications   Medication Sig Start Date End Date Taking? Authorizing Provider  aspirin EC 81 MG tablet Take 81 mg by mouth daily.    Historical Provider, MD  atorvastatin (LIPITOR) 20 MG tablet Take 20 mg by mouth daily.    Historical Provider, MD  cephALEXin (KEFLEX) 500 MG capsule Take 1 capsule (500 mg total) by mouth 4 (four) times daily. 05/29/15   Mady Gemma, PA-C  divalproex (DEPAKOTE ER) 500 MG 24 hr tablet Take 1,000 mg by mouth at bedtime.    Historical Provider, MD  ibuprofen (ADVIL,MOTRIN) 600 MG tablet Take 1 tablet (600 mg total) by mouth every 6 (six) hours as needed. Patient taking differently: Take  600 mg by mouth every 6 (six) hours as needed for moderate pain.  03/02/15   Loren Racer, MD  meloxicam (MOBIC) 15 MG tablet Take 1 tablet by mouth daily. 02/01/14   Historical Provider, MD  Menthol-Methyl Salicylate (ICY HOT BALM EXTRA STRENGTH) 7.6-29 % OINT Apply 1 application topically 3 (three) times daily as needed (pain).    Historical Provider, MD  sertraline (ZOLOFT) 100 MG tablet Take 1 tablet by mouth daily. 02/27/14   Historical Provider, MD  triamterene-hydrochlorothiazide (MAXZIDE-25) 37.5-25 MG per tablet Take 1 tablet by mouth daily.    Historical Provider, MD    BP 148/84 mmHg  Pulse 82  Temp(Src) 97.9 F (36.6 C) (Oral)  Resp 20  Ht  (1.651 m)  Wt 200 lb (90.719 kg)  BMI 33.28 kg/m2  SpO2 96% Physical Exam  Constitutional: He is oriented to person, place, and time. He appears well-developed and well-nourished. No distress.  HENT:  Head: Normocephalic and atraumatic.  Right Ear: External ear normal.  Left Ear: External ear normal.  Nose: Nose normal.  Eyes: Conjunctivae and EOM are normal. Right eye exhibits no discharge. Left eye exhibits no discharge. No scleral icterus.  Neck: Normal range of motion. Neck supple.  Cardiovascular: Normal rate and regular rhythm.   Pulmonary/Chest: Effort normal and breath sounds normal. No respiratory distress.  Musculoskeletal: Normal range of motion. He exhibits no edema or tenderness.  Neurological: He is alert and oriented to person, place, and time.  Skin: Skin is warm and dry. He is not diaphoretic.  1 cm area of induration and mild fluctuance to left groin. No significant erythema, edema, or heat.  Psychiatric: He has a normal mood and affect. His behavior is normal.  Nursing note and vitals reviewed.   ED Course  .Marland Kitchen.Incision and Drainage Date/Time: 05/29/2015 7:23 AM Performed by: Glean HessWESTFALL, Kobee Medlen C Authorized by: Glean HessWESTFALL, Esteban Kobashigawa C Consent: Verbal consent obtained. Risks and benefits: risks, benefits and  alternatives were discussed Consent given by: patient Patient understanding: patient states understanding of the procedure being performed Patient consent: the patient's understanding of the procedure matches consent given Procedure consent: procedure consent matches procedure scheduled Relevant documents: relevant documents present and verified Site marked: the operative site was marked Required items: required blood products, implants, devices, and special equipment available Patient identity confirmed: verbally with patient Time out: Immediately prior to procedure a "time out" was called to verify the correct patient, procedure, equipment, support staff and site/side marked as required. Type: abscess Body area: anogenital (left groin) Anesthesia: local infiltration Local anesthetic: lidocaine 1% without epinephrine Anesthetic total: 2 ml Patient sedated: no Scalpel size: 11 Needle gauge: 22 Incision type: single straight Incision depth: dermal Complexity: simple Drainage: serosanguinous Drainage amount: scant Wound treatment: wound left open Packing material: none Patient tolerance: Patient tolerated the procedure well with no immediate complications   Labs Review Labs Reviewed - No data to display  Imaging Review No results found.     EKG Interpretation None      MDM   Final diagnoses:  Abscess    54 year old male presents with abscess to his left groin x 4 days. Denies fever, chills, abdominal pain, N/V. Patient is afebrile, vital signs stable.1 cm area of induration and mild fluctuance to left groin. No significant erythema, edema, or heat. Incision and drainage performed with minimal drainage. Patient is non-toxic and well-appearing, feel he is stable for discharge at this time. Will send home with antibiotic. Patient to follow-up with PCP in 2 days for wound re-check. Return precautions discussed. Patient verbalizes his understanding and is in agreement with  plan.  BP 148/84 mmHg  Pulse 82  Temp(Src) 97.9 F (36.6 C) (Oral)  Resp 20  Ht 5\' 5"  (1.651 m)  Wt 200 lb (90.719 kg)  BMI 33.28 kg/m2  SpO2 96%     Mady Gemmalizabeth C Hanna Aultman, PA-C 05/29/15 1629  Tomasita CrumbleAdeleke Oni, MD 05/29/15 1801

## 2015-05-29 NOTE — ED Notes (Signed)
Pt reports an abscess to his left groin x 4 days.  Pt presents to have abscess I&D'd.

## 2015-05-29 NOTE — Discharge Instructions (Signed)
1. Medications: keflex, usual home medications 2. Treatment: rest, drink plenty of fluids 3. Follow Up: please followup with your primary doctor in 2 days for wound re-check and for discussion of your diagnoses and further evaluation after today's visit; if you do not have a primary care doctor use the resource guide provided to find one; please return to the ER for signs of infection (redness, swelling, discharge)   Incision and Drainage Incision and drainage is a procedure in which a sac-like structure (cystic structure) is opened and drained. The area to be drained usually contains material such as pus, fluid, or blood.  LET YOUR CAREGIVER KNOW ABOUT:   Allergies to medicine.  Medicines taken, including vitamins, herbs, eyedrops, over-the-counter medicines, and creams.  Use of steroids (by mouth or creams).  Previous problems with anesthetics or numbing medicines.  History of bleeding problems or blood clots.  Previous surgery.  Other health problems, including diabetes and kidney problems.  Possibility of pregnancy, if this applies. RISKS AND COMPLICATIONS  Pain.  Bleeding.  Scarring.  Infection. BEFORE THE PROCEDURE  You may need to have an ultrasound or other imaging tests to see how large or deep your cystic structure is. Blood tests may also be used to determine if you have an infection or how severe the infection is. You may need to have a tetanus shot. PROCEDURE  The affected area is cleaned with a cleaning fluid. The cyst area will then be numbed with a medicine (local anesthetic). A small incision will be made in the cystic structure. A syringe or catheter may be used to drain the contents of the cystic structure, or the contents may be squeezed out. The area will then be flushed with a cleansing solution. After cleansing the area, it is often gently packed with a gauze or another wound dressing. Once it is packed, it will be covered with gauze and tape or some other  type of wound dressing. AFTER THE PROCEDURE   Often, you will be allowed to go home right after the procedure.  You may be given antibiotic medicine to prevent or heal an infection.  If the area was packed with gauze or some other wound dressing, you will likely need to come back in 1 to 2 days to get it removed.  The area should heal in about 14 days.   This information is not intended to replace advice given to you by your health care provider. Make sure you discuss any questions you have with your health care provider.   Document Released: 12/19/2000 Document Revised: 12/25/2011 Document Reviewed: 08/20/2011 Elsevier Interactive Patient Education [employer]   Emergency Department Resource Guide 1) Find a Doctor and Pay Out of Pocket Although you won't have to find out who is covered by your insurance plan, it is a good idea to ask around and get recommendations. You will then need to call the office and see if the doctor you have chosen will accept you as a new patient and what types of options they offer for patients who are self-pay. Some doctors offer discounts or will set up payment plans for their patients who do not have insurance, but you will need to ask so you aren't surprised when you get to your appointment.  2) Contact Your Local Health Department Not all health departments have doctors that can see patients for sick visits, but many do, so it is worth a call to see if yours does. If you don't know where your  local health department is, you can check in your phone book. The CDC also has a tool to help you locate your state's health department, and many state websites also have listings of all of their local health departments.  3) Find a Walk-in Clinic If your illness is not likely to be very severe or complicated, you may want to try a walk in clinic. These are popping up all over the country in pharmacies, drugstores, and shopping centers. They're usually staffed  by nurse practitioners or physician assistants that have been trained to treat common illnesses and complaints. They're usually fairly quick and inexpensive. However, if you have serious medical issues or chronic medical problems, these are probably not your best option.  No Primary Care Doctor: - Call Health Connect at  [phone] - they can help you locate a primary care doctor that  accepts your insurance, provides certain services, etc. - Physician Referral Service- [phone]  Chronic Pain Problems: Organization         Address  Phone   Notes  [facility]  [phone] Patients need to be referred by their primary care doctor.   Medication Assistance: Organization         Address  Phone   Notes  [facility] [address] [place], [place] [address] [phone] --Must be a resident of [facility] -- Must have NO insurance coverage whatsoever (no Medicaid/ Medicare, etc.) -- The pt. MUST have a primary care doctor that directs their care regularly and follows them in the community   [facility]  [phone]   [employer]  [phone]    Agencies that provide inexpensive medical care: Organization         Address  Phone   Notes  [facility]  [phone]   [facility]    [phone]   [facility] [address] [place], [place] [address] [phone]   [facility] [place] [address] [place] [place] [phone]   Planned Parenthood    [phone]   [facility]    [phone]   [facility] [employer] [facility]  [address], [place] Phone:  [phone], Fax:  [phone] Hours of Operation:  9 am - 6 pm, M-F.  Also accepts Medicaid/Medicare and self-pay.  [facility]  [address], [place] Phone: [phone], Fax: [phone]. Hours of Operation:   8:30 am - 5:30 pm, M-F.  Also accepts Medicaid and self-pay.  [facility] [place] [address] [place] Phone: [phone]   [facility] [address] Natasha Bence [place], [place] [phone], Ext. [phone] Mondays & Thursdays: 7-9 AM.  First 15 patients are seen on a first come, first serve basis.    Medicaid-accepting [facility] Providers:  Organization         Address  Phone   Notes  [facility] [address], [place] [phone] Also accepts self-pay patients.  [facility] [address] Laurell Josephs [place], [place]  [phone]   [facility] [address], [place] [phone]   [facility] [address], [place] [phone]   Renaye Rakers [address], [place]   [phone] Only accepts [place] patients after  they have their name applied to their card.   Self-Pay (no insurance) in [facility]:  Organization         Address  Phone   Notes  Sickle Cell Patients, [facility] [address] [place], [place] [phone]   [facility] [address] [place], [place] [phone]   [facility] [place]  [address], [place] [phone]   [facility]/Dr. Osei-Bonsu  [address] [place] or [address], [place] [phone] Phone number for both [place] and [place] locations is the same.  [facility]gent [facility] and [facility] [address] [place] [phone]   [facility] [address], [place] or [address] [phone]   [facility] [address] [place] [phone], phone; [phone], fax Sees patients 1st and 3rd Saturday of every month.  Must not qualify for public or private insurance (i.e. Medicaid, Medicare, [place] Health  Choice, Veterans' Benefits)  Household income should be no more than 200% of the poverty level The clinic cannot treat you if you are pregnant or think you are pregnant  Sexually transmitted diseases are not treated at the clinic.    Dental Care: Organization         Address  Phone  Notes  [facility] [address] [place], [place] [phone] Accepts children up to age 11 who are enrolled in [place] or [place] Health Choice; pregnant women with a Medicaid card; and children who have applied for Medicaid or [place] Health Choice, but were declined, whose parents can pay a reduced fee at time of service.  [facility] of [facility]  [address], [place] [phone] Accepts children up to age 31 who are enrolled in [place] or [place] Health Choice; pregnant women with a Medicaid card; and children who have applied for Medicaid or  Health Choice, but were declined, whose parents can pay a reduced fee at time of service.  [facility]  [address] [place], [place] [phone] Patients are seen by appointment only. Walk-ins are not accepted. [facility] will see patients 19 years of age and older. Monday - Tuesday (8am-5pm) Most Wednesdays (8:30-5pm) $30 per visit, cash only  [facility] Adult Dental Access [facility]  [address], [facility] [phone] Patients are seen by appointment only. Walk-ins are not accepted. [facility] will see patients 13 years of age and older. One Wednesday Evening (Monthly: Volunteer Based).  $30 per visit, cash only  [employer]  [phone] for adults; Children under age 103, call Graduate Pediatric Dentistry at [phone]. Children aged 61-14, please call [phone] to request a pediatric application.  Dental services are provided in all areas of dental care including fillings, crowns and bridges, complete  and partial dentures, implants, gum treatment, root canals, and extractions. Preventive care is also provided. Treatment is provided to both adults and children. Patients are selected via a lottery and there is often a waiting list.   [facility] [address] [place]  [phone] [email or web address]   [facility] [address] [place], [place] [phone], Ext. [phone] Second and Fourth Thursday of each month, opens at 6:30 AM; Clinic ends at 9 AM.  Patients are  seen on a first-come first-served basis, and a limited number are seen during each clinic.   [facility]  [address], [place], [place] [phone]) [phone]   Eligibility Requirements You must have lived in [place], [place], or [place] counties for at least the last three months.   You cannot be eligible for state or federal sponsored [place]healthcare insurance, including CIG[employer]eterans Administration, [place]icaid, or [employer]icare.   You generally cannot be eligible for healthcare insurance through your employer.    How to apply: Eligibility screenings are held every Tuesday and Wednesday afternoon from 1:00 pm until 4:00 pm. You do not need an appointment for the interview!  [facility] [address], [place], [place] [phone]   [facility]  [phone]   [facility]  [phone]   [facility]  [phone]    Behavioral Health Resources in the Community: Intensive Outpatient Programs Organization         Address  Phone  Notes  [facility] [address], [place], [place] [phone]   [facility] [address], [place], [place] [phone]   [facility] [address], [place], [place]  [phone]   [facility] [address],  [place], [place] [phone] or [phone]   Substance Abuse Resources Organization          Address  Phone  Notes  [facility]  [phone]   [facility]  [phone]   [facility]  [phone]   [facility]  [phone]   Residential & Outpatient Substance Abuse Program  [phone]   Psychological Services Organization         Address  Phone  Notes  [facility]  [phone]- [phone]   [facility]  [phone]- [phone]   [facility] [address], [place] [phone] or [phone]    Mobile Crisis Teams Organization         Address  Phone  Notes  [facility], Mobile Crisis Care Unit  [phone]   [facility]  [address] [place], [place] [phone]   Doristine LocksSharon DeEsch [address] [place] [phone]    Self-Help/Support Groups Organization         Address  Phone             Notes  [facility] [place] - variety of support groups  336- [phone] Call for more information  Narcotics Anonymous (NA), [facility] [address], [place]  2 meetings at this location   Statisticianesidential Treatment Programs Organization         Address  Phone  Notes  [facility] [address],    [place]  [phone]   [facility]  [address], [place] [phone], [place], [place] [phone]   [facility] [address], [place] [phone] Admissions: 8am-3pm M-F  [facility] [address]    [place], [place] [phone]   [facility] [address], [place], [place] [phone]   [facility] [address]  [place], [place] [phone]   [facility] - Intensive Outpatient [address], [place], [place] [phone]   [facility] ([facility] [address]  [place], [place] [phone] or [phone]   [facility] ([facility]) [address] [place],  [place]  [phone] Accepts Medicaid  [facility] [address]  [place] [phone] Substance Abuse/Addiction Treatment   [facility] Resources Organization         Address  Phone  Notes  [facility]  [phone]) [phone]   Angie FavaJulie Brannon, PhD [address] [place], [place]   [phone] or [phone]   [facility]   [address] [place], [place] [phone]   [facility] [address], [place], [place] [phone] Insurance/Medicaid/sponsorship through [facility]  [facility] [address]                                    [place], [place] [phone]) [phone] Therapy/tele-psych/case  [facility] [address].   [place], [place] [phone]) [phone]    Dr. Lolly MustacheArfeen  [phone]   [facility]  [employer] [facility] 1) [address], [place] 2) [address], [place] 3)  [address], [place] [phone](336) [phone]   [facility] Child Abuse Hotline [phone](336) [phone] or [phone](336) [phone] (After Hours)

## 2015-06-04 ENCOUNTER — Encounter (HOSPITAL_COMMUNITY): Payer: Self-pay | Admitting: Emergency Medicine

## 2015-06-04 ENCOUNTER — Emergency Department (HOSPITAL_COMMUNITY)
Admission: EM | Admit: 2015-06-04 | Discharge: 2015-06-04 | Discharge status: Against Medical Advice | Payer: Medicare Other | Attending: Emergency Medicine | Admitting: Emergency Medicine

## 2015-06-04 DIAGNOSIS — F17210 Nicotine dependence, cigarettes, uncomplicated: Secondary | ICD-10-CM | POA: Insufficient documentation

## 2015-06-04 DIAGNOSIS — L02214 Cutaneous abscess of groin: Secondary | ICD-10-CM | POA: Insufficient documentation

## 2015-06-04 NOTE — ED Notes (Signed)
Patient decided he did not want to stay and be seen as he was having to wait for a room. Patient d/c'd out, LWBS

## 2015-06-04 NOTE — ED Notes (Addendum)
Patient with abscess to groin, onset 05/25/2015. Patient was seen 05/29/2015 for I&D. Patient states it has now stopped drainage. Patient is concerned it should have gone away by now. Patient states he was taking abx (Keflex per EPIC MAR) that were prescribed but he "got allergic to it and stopped taking it the say before yesterday". Patient reports he was having unilateral jaw swelling with the abx.

## 2015-06-05 ENCOUNTER — Emergency Department (HOSPITAL_COMMUNITY)
Admission: EM | Admit: 2015-06-05 | Discharge: 2015-06-05 | Disposition: A | Discharge status: Home / Self Care | Payer: Medicare Other | Attending: Emergency Medicine | Admitting: Emergency Medicine

## 2015-06-05 ENCOUNTER — Encounter (HOSPITAL_COMMUNITY): Payer: Self-pay | Admitting: Emergency Medicine

## 2015-06-05 DIAGNOSIS — F319 Bipolar disorder, unspecified: Secondary | ICD-10-CM | POA: Insufficient documentation

## 2015-06-05 DIAGNOSIS — R590 Localized enlarged lymph nodes: Secondary | ICD-10-CM | POA: Insufficient documentation

## 2015-06-05 DIAGNOSIS — R1900 Intra-abdominal and pelvic swelling, mass and lump, unspecified site: Secondary | ICD-10-CM | POA: Diagnosis present

## 2015-06-05 DIAGNOSIS — F17210 Nicotine dependence, cigarettes, uncomplicated: Secondary | ICD-10-CM | POA: Diagnosis not present

## 2015-06-05 DIAGNOSIS — Z7982 Long term (current) use of aspirin: Secondary | ICD-10-CM | POA: Insufficient documentation

## 2015-06-05 DIAGNOSIS — E7800 Pure hypercholesterolemia, unspecified: Secondary | ICD-10-CM | POA: Diagnosis not present

## 2015-06-05 DIAGNOSIS — Z79899 Other long term (current) drug therapy: Secondary | ICD-10-CM | POA: Diagnosis not present

## 2015-06-05 NOTE — ED Provider Notes (Signed)
CSN: [id number]     Arrival date & time 06/05/15  [id number] History   First MD Initiated Contact with Patient 06/05/15 [phone]     Chief Complaint  Patient presents with  . Abscess     (Consider location/radiation/quality/duration/timing/severity/associated sxs/prior Treatment) HPI Comments: 54 year old male with bipolar disorder and hyperlipidemia who presents with left groin swelling. The patient presented on 11/20 with concern for abscess. He had I&D at that time which yielded sero-sanguinous fluid. He was discharged on Keflex which he stopped taking because he "got allergic to it" and was having unilateral jaw swelling. He presents today requesting repeat I&D because the area has not gone away yet. He denies any pain, drainage, testicular swelling, problems with urination, abdominal pain, fevers, or systemic illness.   Patient is a 54 y.o. male presenting with abscess. The history is provided by the patient.  Abscess   Past Medical History  Diagnosis Date  . Bipolar 1 disorder (HCC)   . High cholesterol    Past Surgical History  Procedure Laterality Date  . Nephrectomy transplanted organ     No family history on file. Social History  Substance Use Topics  . Smoking status: Current Every Day Smoker -- 0.50 packs/day    Types: Cigarettes  . Smokeless tobacco: Never Used  . Alcohol Use: No    Review of Systems  10 Systems reviewed and are negative for acute change except as noted in the HPI.   Allergies  Sulfa antibiotics and Cephalosporins  Home Medications   Prior to Admission medications   Medication Sig Start Date End Date Taking? Authorizing Provider  aspirin EC 81 MG tablet Take 81 mg by mouth daily.   Yes Historical Provider, MD  atorvastatin (LIPITOR) 20 MG tablet Take 20 mg by mouth daily.   Yes Historical Provider, MD  divalproex (DEPAKOTE ER) 500 MG 24 hr tablet Take 1,000 mg by mouth at bedtime.   Yes Historical Provider, MD  ibuprofen (ADVIL,MOTRIN) 600 MG  tablet Take 1 tablet (600 mg total) by mouth every 6 (six) hours as needed. Patient taking differently: Take 600 mg by mouth every 6 (six) hours as needed for moderate pain.  03/02/15  Yes Loren Raceravid Yelverton, MD  meloxicam (MOBIC) 15 MG tablet Take 15 mg by mouth daily.  02/01/14  Yes Historical Provider, MD  sertraline (ZOLOFT) 100 MG tablet Take 1 tablet by mouth daily. 02/27/14  Yes Historical Provider, MD  triamterene-hydrochlorothiazide (MAXZIDE-25) 37.5-25 MG per tablet Take 1 tablet by mouth daily.   Yes Historical Provider, MD  cephALEXin (KEFLEX) 500 MG capsule Take 1 capsule (500 mg total) by mouth 4 (four) times daily. Patient not taking: Reported on 06/05/2015 05/29/15   Mady GemmaElizabeth C [place], PA-C   BP 132/81 mmHg  Pulse 69  Temp(Src) 97.8 F (36.6 C) (Core (Comment))  Resp 18  Ht 5\' 6"  (1.676 m)  Wt 182 lb 9.6 oz (82.827 kg)  BMI 29.49 kg/m2  SpO2 94% Physical Exam  Constitutional: He is oriented to person, place, and time. He appears well-developed and well-nourished. No distress.  HENT:  Head: Normocephalic and atraumatic.  Moist mucous membranes  Eyes: Conjunctivae are normal.  Neck: Neck supple.  Cardiovascular: Normal rate, regular rhythm and normal heart sounds.   No murmur heard. Pulmonary/Chest: Effort normal and breath sounds normal.  Abdominal: Soft. Bowel sounds are normal. He exhibits no distension. There is no tenderness.  Genitourinary: Penis normal.  L inguinal lymph adenopathy, non-tender, no fluctuance or induration  Neurological: He is  alert and oriented to person, place, and time.  Fluent speech  Skin: Skin is warm and dry.  No erythema or warmth of groin  Psychiatric: He has a normal mood and affect. Judgment normal.  Nursing note and vitals reviewed.   Chaperone was present during exam.  ED Course  Procedures (including critical care time) Labs Review Labs Reviewed - No data to display   MDM   Final diagnoses:  Inguinal lymphadenopathy    Patient presents with persistent area of swelling in left groin after I&D one week ago. Patient well-appearing with normal vital signs on exam. He had a palpable lymph node in the left inguinal area that was nontender, with no surrounding warmth, induration, or focal fluctuance. I reviewed previous note which showed sero-sanguinous drainage from area one week ago. I suspect lymphadenopathy without underlying abscess. Patient repeatedly requested to have it "cut out" but I explained that that might introduce infection. Reviewed return precautions and recommended follow-up with Gen. surgery in 1 month for biopsy if the lymph node has not resolved. Provided follow-up information and patient voiced understanding. Patient discharged in satisfactory condition.  Laurence Spates, MD 06/05/15 [phone]

## 2015-06-05 NOTE — ED Notes (Signed)
Bed: WA04 Expected date:  Expected time:  Means of arrival:  Comments: 

## 2015-06-05 NOTE — ED Notes (Signed)
Pt c/o abscess to L groin, pt had same I & D recently. Pt denies pain, unsure if area is swollen or warm, unsure if abscess is draining

## 2015-06-05 NOTE — Discharge Instructions (Signed)

## 2015-07-06 ENCOUNTER — Emergency Department (HOSPITAL_COMMUNITY)
Admission: EM | Admit: 2015-07-06 | Discharge: 2015-07-06 | Disposition: A | Discharge status: Home / Self Care | Payer: Medicare Other | Attending: Emergency Medicine | Admitting: Emergency Medicine

## 2015-07-06 ENCOUNTER — Encounter (HOSPITAL_COMMUNITY): Payer: Self-pay | Admitting: Emergency Medicine

## 2015-07-06 DIAGNOSIS — Z7982 Long term (current) use of aspirin: Secondary | ICD-10-CM | POA: Diagnosis not present

## 2015-07-06 DIAGNOSIS — Z79899 Other long term (current) drug therapy: Secondary | ICD-10-CM | POA: Insufficient documentation

## 2015-07-06 DIAGNOSIS — R590 Localized enlarged lymph nodes: Secondary | ICD-10-CM | POA: Diagnosis not present

## 2015-07-06 DIAGNOSIS — F17210 Nicotine dependence, cigarettes, uncomplicated: Secondary | ICD-10-CM | POA: Insufficient documentation

## 2015-07-06 DIAGNOSIS — E7800 Pure hypercholesterolemia, unspecified: Secondary | ICD-10-CM | POA: Diagnosis not present

## 2015-07-06 DIAGNOSIS — Z791 Long term (current) use of non-steroidal anti-inflammatories (NSAID): Secondary | ICD-10-CM | POA: Diagnosis not present

## 2015-07-06 DIAGNOSIS — F319 Bipolar disorder, unspecified: Secondary | ICD-10-CM | POA: Insufficient documentation

## 2015-07-06 DIAGNOSIS — R1030 Lower abdominal pain, unspecified: Secondary | ICD-10-CM | POA: Diagnosis present

## 2015-07-06 NOTE — Discharge Instructions (Signed)
You have been seen today for a mass on your thigh. There is no evidence of abscess on ultrasound. You must follow-up with the general surgeon listed in this paperwork for definitive management of this issue. Follow up with PCP as needed. Return to ED should symptoms worsen.

## 2015-07-06 NOTE — ED Provider Notes (Signed)
CSN: [id number]     Arrival date & time 07/06/15  4098 History   First MD Initiated Contact with Patient 07/06/15 [phone]     Chief Complaint  Patient presents with  . Groin Pain  . Abscess     (Consider location/radiation/quality/duration/timing/severity/associated sxs/prior Treatment) HPI   RINALDO MACQUEEN is a 54 y.o. male, with a history of bipolar, presenting to the ED with a small left inguinal mass present for at least a couple months. Pt was seen for this issue about a month ago, was diagnosed with an inflamed lymph node, and told to follow up with a general surgeon. Pt has not done this. Pt denies any pain, fevers, abdominal pain, urinary complaints, penile discharge, or any other complaints.     Past Medical History  Diagnosis Date  . Bipolar 1 disorder (HCC)   . High cholesterol    Past Surgical History  Procedure Laterality Date  . Nephrectomy transplanted organ     History reviewed. No pertinent family history. Social History  Substance Use Topics  . Smoking status: Current Every Day Smoker -- 0.50 packs/day    Types: Cigarettes  . Smokeless tobacco: Never Used  . Alcohol Use: No    Review of Systems  Constitutional: Negative for fever and chills.  Gastrointestinal: Negative for nausea, vomiting and abdominal pain.  Hematological: Positive for adenopathy (Left inginal lymph node).  All other systems reviewed and are negative.     Allergies  Sulfa antibiotics and Cephalosporins  Home Medications   Prior to Admission medications   Medication Sig Start Date End Date Taking? Authorizing Provider  aspirin EC 81 MG tablet Take 81 mg by mouth daily.   Yes Historical Provider, MD  atorvastatin (LIPITOR) 40 MG tablet Take 40 mg by mouth daily. 06/23/15  Yes Historical Provider, MD  divalproex (DEPAKOTE ER) 500 MG 24 hr tablet Take 1,000 mg by mouth at bedtime.   Yes Historical Provider, MD  ibuprofen (ADVIL,MOTRIN) 600 MG tablet Take 1 tablet (600 mg total)  by mouth every 6 (six) hours as needed. Patient taking differently: Take 600 mg by mouth every 6 (six) hours as needed for moderate pain.  03/02/15  Yes Loren Racer, MD  meloxicam (MOBIC) 15 MG tablet Take 15 mg by mouth daily.  02/01/14  Yes Historical Provider, MD  sertraline (ZOLOFT) 100 MG tablet Take 1 tablet by mouth daily. 02/27/14  Yes Historical Provider, MD  triamterene-hydrochlorothiazide (MAXZIDE-25) 37.5-25 MG per tablet Take 1 tablet by mouth daily.   Yes Historical Provider, MD  cephALEXin (KEFLEX) 500 MG capsule Take 1 capsule (500 mg total) by mouth 4 (four) times daily. Patient not taking: Reported on 06/05/2015 05/29/15   Mady Gemma, PA-C   BP 133/83 mmHg  Pulse 65  Temp(Src) 97.6 F (36.4 C) (Oral)  Resp 18  SpO2 95% Physical Exam  Constitutional: He appears well-developed and well-nourished. No distress.  HENT:  Head: Normocephalic and atraumatic.  Eyes: Conjunctivae are normal. Pupils are equal, round, and reactive to light.  Cardiovascular: Normal rate, regular rhythm and normal heart sounds.   Pulmonary/Chest: Effort normal and breath sounds normal. No respiratory distress.  Abdominal: Soft. Bowel sounds are normal. There is no tenderness.  Genitourinary: Testes normal and penis normal. Cremasteric reflex is present. Right testis shows no mass, no swelling and no tenderness. Left testis shows no mass, no swelling and no tenderness. Circumcised.  Soft, mobile mass about the size of a small grape in left inguinal chain. No fluctuance  to suggest abscess.   Musculoskeletal: He exhibits no edema or tenderness.  Lymphadenopathy:    He has no cervical adenopathy.       Left: Inguinal adenopathy present.  Neurological: He is alert.  Skin: Skin is warm and dry. He is not diaphoretic.  Nursing note and vitals reviewed.   ED Course  Procedures (including critical care time)  EMERGENCY DEPARTMENT US SOFT TISSUE INTERPRETATION "Study: Limited Ultrasound of  the noted body part in comments below"  INDICATIONS: Other (refer to comments) Multiple views of the body part are obtained with a multi-frequency linear probe  PERFORMED BY:  Myself  IMAGES ARCHIVED?: Yes  SIDE:Left  BODY PART:Lower extremity  FINDINGS: No abcess noted  LIMITATIONS: None  INTERPRETATION:  No abcess noted  COMMENT:  The patient was assessed with ultrasound due to a mass located in the left inguinal region.   Labs Review Labs Reviewed - No data to display  Imaging Review No results found. I have personally reviewed and evaluated these images and lab results as part of my medical decision-making.   EKG Interpretation None      MDM   Final diagnoses:  Inguinal lymphadenopathy    Garth SchlatterChester K Gregory presents with a mass in his left inguinal region for the past few months.  Findings and plan of care discussed with Laurence Spatesachel Morgan Little, MD.  Patient has had no change in the size of this mass since it arose. Patient is afebrile and not tachycardic. Patient has no systemic symptoms. Mass is not painful and has no fluctuance consistent with an abscess. Patient was advised again to follow up with the general surgeon that was on his paperwork from a month ago when he was also seen for this issue. Patient kept saying, "I just want to cut it off. Can't you just cut it off." Patient was advised that there was no indication for I&D. Patient was given explicit instructions for follow-up and told of the importance of this follow-up. Patient voiced understanding of these instructions and is comfortable with discharge.  Anselm PancoastShawn C Blake Goya, PA-C 07/06/15 [phone]  Laurence Spatesachel Morgan Little, MD 07/07/15 [phone]

## 2015-07-06 NOTE — ED Notes (Signed)
Patient here with complaints of left side groin pain, abscess. States that he has been here before for the same and has now decided he wants it "cut off".

## 2015-10-08 ENCOUNTER — Emergency Department (HOSPITAL_COMMUNITY)
Admission: EM | Admit: 2015-10-08 | Discharge: 2015-10-08 | Disposition: A | Discharge status: Home / Self Care | Payer: Medicare Other | Attending: Emergency Medicine | Admitting: Emergency Medicine

## 2015-10-08 ENCOUNTER — Encounter (HOSPITAL_COMMUNITY): Payer: Self-pay | Admitting: Emergency Medicine

## 2015-10-08 DIAGNOSIS — Z79899 Other long term (current) drug therapy: Secondary | ICD-10-CM | POA: Insufficient documentation

## 2015-10-08 DIAGNOSIS — E7800 Pure hypercholesterolemia, unspecified: Secondary | ICD-10-CM | POA: Diagnosis not present

## 2015-10-08 DIAGNOSIS — Z5189 Encounter for other specified aftercare: Secondary | ICD-10-CM

## 2015-10-08 DIAGNOSIS — Z9889 Other specified postprocedural states: Secondary | ICD-10-CM | POA: Insufficient documentation

## 2015-10-08 DIAGNOSIS — F17210 Nicotine dependence, cigarettes, uncomplicated: Secondary | ICD-10-CM | POA: Insufficient documentation

## 2015-10-08 DIAGNOSIS — F319 Bipolar disorder, unspecified: Secondary | ICD-10-CM | POA: Diagnosis not present

## 2015-10-08 DIAGNOSIS — Z791 Long term (current) use of non-steroidal anti-inflammatories (NSAID): Secondary | ICD-10-CM | POA: Diagnosis not present

## 2015-10-08 DIAGNOSIS — Z4801 Encounter for change or removal of surgical wound dressing: Secondary | ICD-10-CM | POA: Insufficient documentation

## 2015-10-08 DIAGNOSIS — Z7982 Long term (current) use of aspirin: Secondary | ICD-10-CM | POA: Insufficient documentation

## 2015-10-08 MED ORDER — BACITRACIN ZINC 500 UNIT/GM EX OINT
TOPICAL_OINTMENT | CUTANEOUS | Status: AC
  Administered 2015-10-08: 1
  Filled 2015-10-08: qty 0.9

## 2015-10-08 NOTE — ED Notes (Signed)
Pt states he had some moles removed yesterday. Says he took a shower and washed off the creams and removed the bandages they had placed over them. Says he just wants more cream and bandages for the wounds. Wound bed of larger area black, patient states where they placed charcoal there to prevent bleeding.

## 2015-10-08 NOTE — ED Provider Notes (Signed)
CSN: [id number]     Arrival date & time 10/08/15  [id number] History   First MD Initiated Contact with Patient 10/08/15 [phone]     Chief Complaint  Patient presents with  . Wound Check     (Consider location/radiation/quality/duration/timing/severity/associated sxs/prior Treatment) HPI Peter Roth is a 55 y.o. male with recent mole removal surgery under his left armpit comes in for evaluation of wound check. Patient reports he took a shower last night and washed off the original antibiotic cream and through with the bandages. He reports today to have the bandages reapplied. He denies any redness, swelling, fevers or chills or other evidence of infection. No other modifying factors  Past Medical History  Diagnosis Date  . Bipolar 1 disorder (HCC)   . High cholesterol    Past Surgical History  Procedure Laterality Date  . Nephrectomy transplanted organ     History reviewed. No pertinent family history. Social History  Substance Use Topics  . Smoking status: Current Every Day Smoker -- 0.50 packs/day    Types: Cigarettes  . Smokeless tobacco: Never Used  . Alcohol Use: No    Review of Systems A 10 point review of systems was completed and was negative except for pertinent positives and negatives as mentioned in the history of present illness     Allergies  Sulfa antibiotics and Cephalosporins  Home Medications   Prior to Admission medications   Medication Sig Start Date End Date Taking? Authorizing Provider  aspirin EC 81 MG tablet Take 81 mg by mouth daily.    Historical Provider, MD  atorvastatin (LIPITOR) 40 MG tablet Take 40 mg by mouth daily. 06/23/15   Historical Provider, MD  cephALEXin (KEFLEX) 500 MG capsule Take 1 capsule (500 mg total) by mouth 4 (four) times daily. Patient not taking: Reported on 06/05/2015 05/29/15   Mady GemmaElizabeth C Westfall, PA-C  divalproex (DEPAKOTE ER) 500 MG 24 hr tablet Take 1,000 mg by mouth at bedtime.    Historical Provider, MD  ibuprofen  (ADVIL,MOTRIN) 600 MG tablet Take 1 tablet (600 mg total) by mouth every 6 (six) hours as needed. Patient taking differently: Take 600 mg by mouth every 6 (six) hours as needed for moderate pain.  03/02/15   Loren Raceravid Yelverton, MD  meloxicam (MOBIC) 15 MG tablet Take 15 mg by mouth daily.  02/01/14   Historical Provider, MD  sertraline (ZOLOFT) 100 MG tablet Take 1 tablet by mouth daily. 02/27/14   Historical Provider, MD  triamterene-hydrochlorothiazide (MAXZIDE-25) 37.5-25 MG per tablet Take 1 tablet by mouth daily.    Historical Provider, MD   BP 134/70 mmHg  Pulse 87  Temp(Src) 97.5 F (36.4 C) (Oral)  Resp 20  SpO2 98% Physical Exam  Constitutional:  Awake, alert, nontoxic appearance.  HENT:  Head: Atraumatic.  Eyes: Right eye exhibits no discharge. Left eye exhibits no discharge.  Neck: Neck supple.  Pulmonary/Chest: Effort normal. He exhibits no tenderness.  Abdominal: Soft. There is no tenderness. There is no rebound.  Musculoskeletal: He exhibits no tenderness.  Baseline ROM, no obvious new focal weakness.  Neurological:  Mental status and motor strength appears baseline for patient and situation.  Skin: No rash noted.  3 small, circumferential, well healing surgical lesions at proximal aspect of left upper extremitie at axilla.  Psychiatric: He has a normal mood and affect.  Nursing note and vitals reviewed.   ED Course  Procedures (including critical care time) Labs Review Labs Reviewed - No data to display  Imaging Review  No results found. I have personally reviewed and evaluated these images and lab results as part of my medical decision-making.   EKG Interpretation None     Filed Vitals:   10/08/15 0722  BP: 134/70  Pulse: 87  Temp: 97.5 F (36.4 C)  TempSrc: Oral  Resp: 20  SpO2: 98%    MDM  Peter Roth is a 55 y.o. male who comes in for evaluation wound check. Recent mole removal under left arm. Wants the wounds rewrapped. Wounds appear to be  well-healing. No evidence of infection. No systemic signs of infection. Topical antibiotic ointment placed, wounds rewrapped with sterile dressing. Patient appears very well. Hemodynamically stable afebrile. Appropriate for outpatient follow-up. Final diagnoses:  Visit for wound check       Joycie Peek, PA-C 10/08/15 0981  Rolan Bucco, MD 10/08/15 1000

## 2015-11-21 ENCOUNTER — Emergency Department (HOSPITAL_COMMUNITY): Payer: Medicare Other

## 2015-11-21 ENCOUNTER — Encounter (HOSPITAL_COMMUNITY): Payer: Self-pay | Admitting: Emergency Medicine

## 2015-11-21 ENCOUNTER — Emergency Department (HOSPITAL_COMMUNITY)
Admission: EM | Admit: 2015-11-21 | Discharge: 2015-11-21 | Disposition: A | Discharge status: Home / Self Care | Payer: Medicare Other | Attending: Emergency Medicine | Admitting: Emergency Medicine

## 2015-11-21 DIAGNOSIS — R079 Chest pain, unspecified: Secondary | ICD-10-CM | POA: Diagnosis present

## 2015-11-21 DIAGNOSIS — R0789 Other chest pain: Secondary | ICD-10-CM | POA: Diagnosis not present

## 2015-11-21 DIAGNOSIS — F17210 Nicotine dependence, cigarettes, uncomplicated: Secondary | ICD-10-CM | POA: Insufficient documentation

## 2015-11-21 DIAGNOSIS — Z7982 Long term (current) use of aspirin: Secondary | ICD-10-CM | POA: Insufficient documentation

## 2015-11-21 DIAGNOSIS — F319 Bipolar disorder, unspecified: Secondary | ICD-10-CM | POA: Diagnosis not present

## 2015-11-21 LAB — CBC
HEMATOCRIT: 40.1 % (ref 39.0–52.0)
HEMOGLOBIN: 13.9 g/dL (ref 13.0–17.0)
MCH: 29.7 pg (ref 26.0–34.0)
MCHC: 34.7 g/dL (ref 30.0–36.0)
MCV: 85.7 fL (ref 78.0–100.0)
Platelets: 231 10*3/uL (ref 150–400)
RBC: 4.68 MIL/uL (ref 4.22–5.81)
RDW: 12.7 % (ref 11.5–15.5)
WBC: 7.8 10*3/uL (ref 4.0–10.5)

## 2015-11-21 LAB — BASIC METABOLIC PANEL
ANION GAP: 10 (ref 5–15)
BUN: 20 mg/dL (ref 6–20)
CALCIUM: 9.5 mg/dL (ref 8.9–10.3)
CO2: 26 mmol/L (ref 22–32)
Chloride: 101 mmol/L (ref 101–111)
Creatinine, Ser: 0.85 mg/dL (ref 0.61–1.24)
GFR calc Af Amer: >60 mL/min (ref >60)
GLUCOSE: 150 mg/dL — AB (ref 65–99)
POTASSIUM: 4.2 mmol/L (ref 3.5–5.1)
SODIUM: 137 mmol/L (ref 135–145)

## 2015-11-21 LAB — I-STAT TROPONIN, ED
TROPONIN I, POC: 0 ng/mL (ref 0.00–0.08)
TROPONIN I, POC: 0 ng/mL (ref 0.00–0.08)

## 2015-11-21 MED ORDER — ASPIRIN 325 MG PO TABS
325.0000 mg | ORAL_TABLET | Freq: Once | ORAL | Status: AC
  Administered 2015-11-21: 325 mg via ORAL
  Filled 2015-11-21: qty 1

## 2015-11-21 NOTE — ED Notes (Signed)
Nurse starting IV will collect labs 

## 2015-11-21 NOTE — Discharge Instructions (Signed)
There does not appear to be an emergent cause for your symptoms at this time. Your exam, EKG, x-rays and all labs are reassuring. Please follow-up with cardiology for further evaluation. Return to ED for new or worsening symptoms as we discussed.  Nonspecific Chest Pain  Chest pain can be caused by many different conditions. There is always a chance that your pain could be related to something serious, such as a heart attack or a blood clot in your lungs. Chest pain can also be caused by conditions that are not life-threatening. If you have chest pain, it is very important to follow up with your health care provider. CAUSES  Chest pain can be caused by:  Heartburn.  Pneumonia or bronchitis.  Anxiety or stress.  Inflammation around your heart (pericarditis) or lung (pleuritis or pleurisy).  A blood clot in your lung.  A collapsed lung (pneumothorax). It can develop suddenly on its own (spontaneous pneumothorax) or from trauma to the chest.  Shingles infection (varicella-zoster virus).  Heart attack.  Damage to the bones, muscles, and cartilage that make up your chest wall. This can include:  Bruised bones due to injury.  Strained muscles or cartilage due to frequent or repeated coughing or overwork.  Fracture to one or more ribs.  Sore cartilage due to inflammation (costochondritis). RISK FACTORS  Risk factors for chest pain may include:  Activities that increase your risk for trauma or injury to your chest.  Respiratory infections or conditions that cause frequent coughing.  Medical conditions or overeating that can cause heartburn.  Heart disease or family history of heart disease.  Conditions or health behaviors that increase your risk of developing a blood clot.  Having had chicken pox (varicella zoster). SIGNS AND SYMPTOMS Chest pain can feel like:  Burning or tingling on the surface of your chest or deep in your chest.  Crushing, pressure, aching, or squeezing  pain.  Dull or sharp pain that is worse when you move, cough, or take a deep breath.  Pain that is also felt in your back, neck, shoulder, or arm, or pain that spreads to any of these areas. Your chest pain may come and go, or it may stay constant. DIAGNOSIS Lab tests or other studies may be needed to find the cause of your pain. Your health care provider may have you take a test called an ambulatory ECG (electrocardiogram). An ECG records your heartbeat patterns at the time the test is performed. You may also have other tests, such as:  Transthoracic echocardiogram (TTE). During echocardiography, sound waves are used to create a picture of all of the heart structures and to look at how blood flows through your heart.  Transesophageal echocardiogram (TEE).This is a more advanced imaging test that obtains images from inside your body. It allows your health care provider to see your heart in finer detail.  Cardiac monitoring. This allows your health care provider to monitor your heart rate and rhythm in real time.  Holter monitor. This is a portable device that records your heartbeat and can help to diagnose abnormal heartbeats. It allows your health care provider to track your heart activity for several days, if needed.  Stress tests. These can be done through exercise or by taking medicine that makes your heart beat more quickly.  Blood tests.  Imaging tests. TREATMENT  Your treatment depends on what is causing your chest pain. Treatment may include:  Medicines. These may include:  Acid blockers for heartburn.  Anti-inflammatory medicine.  Pain  medicine for inflammatory conditions. °¨ Antibiotic medicine, if an infection is present. °¨ Medicines to dissolve blood clots. °¨ Medicines to treat coronary artery disease. °· Supportive care for conditions that do not require medicines. This may include: °¨ Resting. °¨ Applying heat or cold packs to injured areas. °¨ Limiting activities  until pain decreases. °HOME CARE INSTRUCTIONS °· If you were prescribed an antibiotic medicine, finish it all even if you start to feel better. °· Avoid any activities that bring on chest pain. °· Do not use any tobacco products, including cigarettes, chewing tobacco, or electronic cigarettes. If you need help quitting, ask your health care provider. °· Do not drink alcohol. °· Take medicines only as directed by your health care provider. °· Keep all follow-up visits as directed by your health care provider. This is important. This includes any further testing if your chest pain does not go away. °· If heartburn is the cause for your chest pain, you may be told to keep your head raised (elevated) while sleeping. This reduces the chance that acid will go from your stomach into your esophagus. °· Make lifestyle changes as directed by your health care provider. These may include: °¨ Getting regular exercise. Ask your health care provider to suggest some activities that are safe for you. °¨ Eating a heart-healthy diet. A registered dietitian can help you to learn healthy eating options. °¨ Maintaining a healthy weight. °¨ Managing diabetes, if necessary. °¨ Reducing stress. °SEEK MEDICAL CARE IF: °· Your chest pain does not go away after treatment. °· You have a rash with blisters on your chest. °· You have a fever. °SEEK IMMEDIATE MEDICAL CARE IF:  °· Your chest pain is worse. °· You have an increasing cough, or you cough up blood. °· You have severe abdominal pain. °· You have severe weakness. °· You faint. °· You have chills. °· You have sudden, unexplained chest discomfort. °· You have sudden, unexplained discomfort in your arms, back, neck, or jaw. °· You have shortness of breath at any time. °· You suddenly start to sweat, or your skin gets clammy. °· You feel nauseous or you vomit. °· You suddenly feel light-headed or dizzy. °· Your heart begins to beat quickly, or it feels like it is skipping beats. °These  symptoms may represent a serious problem that is an emergency. Do not wait to see if the symptoms will go away. Get medical help right away. Call your local emergency services (911 in the [place] Do not drive yourself to the hospital. °  °This information is not intended to replace advice given to you by your health care provider. Make sure you discuss any questions you have with your health care provider. °  °Document Released: 04/04/2005 Document Revised: 07/16/2014 Document Reviewed: 01/29/2014 °Elsevier Interactive Patient Education ©2016 [employer] ° °

## 2015-11-21 NOTE — ED Notes (Signed)
Cartner at bedside.

## 2015-11-21 NOTE — ED Notes (Signed)
Patient is complaining of chest pain all in the middle to the left side. Patient states pain started Yesterday morning. Patient took medication and it did not make it better.

## 2015-11-21 NOTE — ED Notes (Signed)
Pt reports evaluated by PCP for sinus infection 3-4 days ago; complaint of continued productive cough and new onset chest pain with cough yesterday.

## 2015-11-21 NOTE — ED Provider Notes (Signed)
CSN: [id number]     Arrival date & time 11/21/15  [id number] History   First MD Initiated Contact with Patient 11/21/15 [phone]     Chief Complaint  Patient presents with  . Chest Pain     (Consider location/radiation/quality/duration/timing/severity/associated sxs/prior Treatment) HPI Peter Roth is a 55 y.o. male reports a history of hypertension, hyperlipidemia, half pack per day smoker and family history of coronary artery disease here for evaluation of chest pain. Patient reports chest pain in his right chest that has been intermittent over the past 2 days. Characterizes sensation is sharp and stabbing. Denies any shortness of breath, nausea or vomiting, numbness or weakness. Symptoms seemed to improve with lying flat. No other alleviating or aggravating factors. Also states he had a sister die at age of 56 of sudden cardiac death.  Past Medical History  Diagnosis Date  . Bipolar 1 disorder (HCC)   . High cholesterol    Past Surgical History  Procedure Laterality Date  . Nephrectomy transplanted organ     History reviewed. No pertinent family history. Social History  Substance Use Topics  . Smoking status: Current Every Day Smoker -- 0.50 packs/day    Types: Cigarettes  . Smokeless tobacco: Never Used  . Alcohol Use: No    Review of Systems A 10 point review of systems was completed and was negative except for pertinent positives and negatives as mentioned in the history of present illness     Allergies  Sulfa antibiotics and Cephalosporins  Home Medications   Prior to Admission medications   Medication Sig Start Date End Date Taking? Authorizing Provider  aspirin EC 81 MG tablet Take 81 mg by mouth daily.    Historical Provider, MD  atorvastatin (LIPITOR) 40 MG tablet Take 40 mg by mouth daily. 06/23/15   Historical Provider, MD  cephALEXin (KEFLEX) 500 MG capsule Take 1 capsule (500 mg total) by mouth 4 (four) times daily. Patient not taking: Reported on 06/05/2015  05/29/15   Mady GemmaElizabeth C Westfall, PA-C  divalproex (DEPAKOTE ER) 500 MG 24 hr tablet Take 1,000 mg by mouth at bedtime.    Historical Provider, MD  ibuprofen (ADVIL,MOTRIN) 600 MG tablet Take 1 tablet (600 mg total) by mouth every 6 (six) hours as needed. Patient taking differently: Take 600 mg by mouth every 6 (six) hours as needed for moderate pain.  03/02/15   Loren Raceravid Yelverton, MD  meloxicam (MOBIC) 15 MG tablet Take 15 mg by mouth daily.  02/01/14   Historical Provider, MD  sertraline (ZOLOFT) 100 MG tablet Take 1 tablet by mouth daily. 02/27/14   Historical Provider, MD  triamterene-hydrochlorothiazide (MAXZIDE-25) 37.5-25 MG per tablet Take 1 tablet by mouth daily.    Historical Provider, MD   BP 113/74 mmHg  Pulse 65  Temp(Src) 97.9 F (36.6 C) (Oral)  Resp 13  SpO2 98% Physical Exam  Constitutional: He is oriented to person, place, and time. He appears well-developed and well-nourished.  HENT:  Head: Normocephalic and atraumatic.  Mouth/Throat: Oropharynx is clear and moist.  Eyes: Conjunctivae are normal. Pupils are equal, round, and reactive to light. Right eye exhibits no discharge. Left eye exhibits no discharge. No scleral icterus.  Neck: Neck supple.  Cardiovascular: Normal rate, regular rhythm and normal heart sounds.   Pulmonary/Chest: Effort normal and breath sounds normal. No respiratory distress. He has no wheezes. He has no rales. He exhibits tenderness.    Tenderness in right pectoralis musculature replicates and exacerbates discomfort  Abdominal: Soft. There is  no tenderness.  Musculoskeletal: He exhibits no tenderness.  Neurological: He is alert and oriented to person, place, and time.  Cranial Nerves II-XII grossly intact  Skin: Skin is warm and dry. No rash noted.  Psychiatric: He has a normal mood and affect.  Nursing note and vitals reviewed.   ED Course  Procedures (including critical care time) Labs Review Labs Reviewed  BASIC METABOLIC PANEL - Abnormal;  Notable for the following:    Glucose, Bld 150 (*)    All other components within normal limits  CBC  I-STAT TROPOININ, ED  Rosezena Sensor, ED    Imaging Review Dg Chest 2 View  11/21/2015  CLINICAL DATA:  Intermittent chest pain for the past several days. History smoking. Recent cough. EXAM: CHEST  2 VIEW COMPARISON:  03/08/2014 FINDINGS: Grossly unchanged cardiac silhouette and mediastinal contours. Atherosclerotic plaque within the thoracic aorta. There is minimal pleural parenchymal thickening about the right minor fissure. No focal airspace opacities. No pleural effusion or pneumothorax. No evidence of edema. No acute osseus abnormalities. Stigmata of DISH within the thoracic spine. IMPRESSION: No acute cardiopulmonary disease. Electronically Signed   By: Simonne Come M.D.   On: 11/21/2015 07:11   I have personally reviewed and evaluated these images and lab results as part of my medical decision-making.   EKG Interpretation   Date/Time:  Monday Nov 21 2015 06:39:49 EDT Ventricular Rate:  78 PR Interval:  123 QRS Duration: 85 QT Interval:  364 QTC Calculation: 415 R Axis:   89 Text Interpretation:  Sinus rhythm Abnormal inferior Q waves No previous  ECGs available Confirmed by NGUYEN, EMILY ([id number]) on 11/21/2015 9:33:22 AM     Meds given in ED:  Medications  aspirin tablet 325 mg (325 mg Oral Given 11/21/15 1134)    Discharge Medication List as of 11/21/2015 11:03 AM     Filed Vitals:   11/21/15 0842 11/21/15 0900 11/21/15 1000 11/21/15 1129  BP: 136/88 130/79 113/74 131/90  Pulse: 68 65 65 74  Temp:    98.8 F (37.1 C)  TempSrc:    Oral  Resp: SpO2: 98% 94% 98% 97%    MDM  Peter Roth is a 55 y.o. male here for chest pain. Symptoms are very atypical and not concerning for ACS. Patient appears very well. Discomfort is replicated with palpation of right pectoralis musculature. Heart score 3 due to risk factors. However, given risk factors and  family history of sudden cardiac death, we will obtain delta troponin. Given aspirin. EKG is reassuring. Chest x-ray is unremarkable. Patient appears in no apparent distress. Remains afebrile and hemodynamically stable. Low suspicion for PE. Discussed with my attending, Dr. Cyndie Chime. Patient appropriate for outpatient follow-up, given referral to cardiology. Final diagnoses:  Discomfort of chest wall       Joycie Peek, PA-C 11/21/15 1242  Leta Baptist, MD 11/29/15 1726

## 2015-11-23 ENCOUNTER — Emergency Department (HOSPITAL_COMMUNITY)
Admission: EM | Admit: 2015-11-23 | Discharge: 2015-11-23 | Disposition: A | Discharge status: Home / Self Care | Payer: Medicare Other | Attending: Emergency Medicine | Admitting: Emergency Medicine

## 2015-11-23 ENCOUNTER — Emergency Department (HOSPITAL_COMMUNITY): Payer: Medicare Other

## 2015-11-23 ENCOUNTER — Encounter (HOSPITAL_COMMUNITY): Payer: Self-pay | Admitting: Vascular Surgery

## 2015-11-23 DIAGNOSIS — Z791 Long term (current) use of non-steroidal anti-inflammatories (NSAID): Secondary | ICD-10-CM | POA: Diagnosis not present

## 2015-11-23 DIAGNOSIS — Z79899 Other long term (current) drug therapy: Secondary | ICD-10-CM | POA: Insufficient documentation

## 2015-11-23 DIAGNOSIS — E7800 Pure hypercholesterolemia, unspecified: Secondary | ICD-10-CM | POA: Insufficient documentation

## 2015-11-23 DIAGNOSIS — Z7982 Long term (current) use of aspirin: Secondary | ICD-10-CM | POA: Insufficient documentation

## 2015-11-23 DIAGNOSIS — M25561 Pain in right knee: Secondary | ICD-10-CM

## 2015-11-23 DIAGNOSIS — F319 Bipolar disorder, unspecified: Secondary | ICD-10-CM | POA: Diagnosis not present

## 2015-11-23 DIAGNOSIS — Y9389 Activity, other specified: Secondary | ICD-10-CM | POA: Insufficient documentation

## 2015-11-23 DIAGNOSIS — F17210 Nicotine dependence, cigarettes, uncomplicated: Secondary | ICD-10-CM | POA: Diagnosis not present

## 2015-11-23 DIAGNOSIS — M62830 Muscle spasm of back: Secondary | ICD-10-CM | POA: Insufficient documentation

## 2015-11-23 DIAGNOSIS — Y92410 Unspecified street and highway as the place of occurrence of the external cause: Secondary | ICD-10-CM | POA: Diagnosis not present

## 2015-11-23 DIAGNOSIS — M545 Low back pain, unspecified: Secondary | ICD-10-CM

## 2015-11-23 DIAGNOSIS — Y998 Other external cause status: Secondary | ICD-10-CM | POA: Insufficient documentation

## 2015-11-23 DIAGNOSIS — M1711 Unilateral primary osteoarthritis, right knee: Secondary | ICD-10-CM | POA: Insufficient documentation

## 2015-11-23 DIAGNOSIS — S8991XA Unspecified injury of right lower leg, initial encounter: Secondary | ICD-10-CM | POA: Diagnosis not present

## 2015-11-23 DIAGNOSIS — S3992XA Unspecified injury of lower back, initial encounter: Secondary | ICD-10-CM | POA: Diagnosis present

## 2015-11-23 MED ORDER — CYCLOBENZAPRINE HCL 10 MG PO TABS: 10.0000 mg | ORAL_TABLET | Freq: Three times a day (TID) | ORAL | Status: DC | PRN

## 2015-11-23 MED ORDER — NAPROXEN 250 MG PO TABS
500.0000 mg | ORAL_TABLET | Freq: Once | ORAL | Status: AC
  Administered 2015-11-23: 500 mg via ORAL
  Filled 2015-11-23: qty 2
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
## 2015-11-23 NOTE — ED Notes (Signed)
Pt stable, ambulatory, states understanding of discharge instructions 

## 2016-04-13 ENCOUNTER — Encounter (HOSPITAL_COMMUNITY): Payer: Self-pay

## 2016-04-13 ENCOUNTER — Emergency Department (HOSPITAL_COMMUNITY)
Admission: EM | Admit: 2016-04-13 | Discharge: 2016-04-13 | Disposition: A | Discharge status: Home / Self Care | Payer: Medicare Other | Attending: Emergency Medicine | Admitting: Emergency Medicine

## 2016-04-13 DIAGNOSIS — Z79899 Other long term (current) drug therapy: Secondary | ICD-10-CM | POA: Diagnosis not present

## 2016-04-13 DIAGNOSIS — Z7982 Long term (current) use of aspirin: Secondary | ICD-10-CM | POA: Insufficient documentation

## 2016-04-13 DIAGNOSIS — Z792 Long term (current) use of antibiotics: Secondary | ICD-10-CM | POA: Insufficient documentation

## 2016-04-13 DIAGNOSIS — J329 Chronic sinusitis, unspecified: Secondary | ICD-10-CM

## 2016-04-13 DIAGNOSIS — F17210 Nicotine dependence, cigarettes, uncomplicated: Secondary | ICD-10-CM | POA: Insufficient documentation

## 2016-04-13 DIAGNOSIS — R0981 Nasal congestion: Secondary | ICD-10-CM | POA: Diagnosis present

## 2016-04-13 DIAGNOSIS — J019 Acute sinusitis, unspecified: Secondary | ICD-10-CM | POA: Insufficient documentation

## 2016-04-13 MED ORDER — AMOXICILLIN 500 MG PO CAPS: 500.0000 mg | ORAL_CAPSULE | Freq: Three times a day (TID) | ORAL | 0 refills | Status: DC

## 2016-04-13 NOTE — ED Provider Notes (Signed)
[facility]-EMERGENCY DEPT Provider Note   CSN: [id number] Arrival date & time: 04/13/16  0441     History   Chief Complaint Chief Complaint  Patient presents with  . URI    HPI Peter Roth is a 55 y.o. male.  He presents for sinus congestion, and rhinorrhea, green in color for several days. He denies fever, chills, ear pain, sore throat, cough or shortness of breath. He has had some improvement of the pressure sensation in his face after beginning over-the-counter sinus medication. No recurrent sinusitis. There are no other known modifying factors.  HPI  Past Medical History:  Diagnosis Date  . Bipolar 1 disorder (HCC)   . High cholesterol     There are no active problems to display for this patient.   Past Surgical History:  Procedure Laterality Date  . NEPHRECTOMY TRANSPLANTED ORGAN         Home Medications    Prior to Admission medications   Medication Sig Start Date End Date Taking? Authorizing Provider  amoxicillin (AMOXIL) 500 MG capsule Take 1 capsule (500 mg total) by mouth 3 (three) times daily. 04/13/16   Mancel Bale, MD  aspirin EC 81 MG tablet Take 81 mg by mouth daily.    Historical Provider, MD  atorvastatin (LIPITOR) 40 MG tablet Take 40 mg by mouth daily. 06/23/15   Historical Provider, MD  cephALEXin (KEFLEX) 500 MG capsule Take 1 capsule (500 mg total) by mouth 4 (four) times daily. Patient not taking: Reported on 06/05/2015 05/29/15   Mady Gemma, PA-C  cyclobenzaprine (FLEXERIL) 10 MG tablet Take 1 tablet (10 mg total) by mouth 3 (three) times daily as needed for muscle spasms. 11/23/15   Mercedes Camprubi-Soms, PA-C  divalproex (DEPAKOTE ER) 500 MG 24 hr tablet Take 1,000 mg by mouth at bedtime.    Historical Provider, MD  ibuprofen (ADVIL,MOTRIN) 600 MG tablet Take 1 tablet (600 mg total) by mouth every 6 (six) hours as needed. Patient taking differently: Take 600 mg by mouth every 6 (six) hours as needed for moderate pain.  03/02/15    Loren Racer, MD  meloxicam (MOBIC) 15 MG tablet Take 15 mg by mouth daily.  02/01/14   Historical Provider, MD  sertraline (ZOLOFT) 100 MG tablet Take 1 tablet by mouth daily. 02/27/14   Historical Provider, MD  triamterene-hydrochlorothiazide (MAXZIDE-25) 37.5-25 MG per tablet Take 1 tablet by mouth daily.    Historical Provider, MD    Family History History reviewed. No pertinent family history.  Social History Social History  Substance Use Topics  . Smoking status: Current Every Day Smoker    Packs/day: 0.50    Types: Cigarettes  . Smokeless tobacco: Never Used  . Alcohol use No     Allergies   Sulfa antibiotics and Cephalosporins   Review of Systems Review of Systems  All other systems reviewed and are negative.    Physical Exam Updated Vital Signs BP 143/91 (BP Location: Left Arm)   Pulse 77   Temp 98.3 F (36.8 C) (Oral)   Resp 19   SpO2 98%   Physical Exam  Constitutional: He is oriented to person, place, and time. He appears well-developed and well-nourished.  HENT:  Head: Normocephalic and atraumatic.  Right Ear: External ear normal.  Left Ear: External ear normal.  Eyes: Conjunctivae and EOM are normal. Pupils are equal, round, and reactive to light.  Neck: Normal range of motion and phonation normal. Neck supple.  Cardiovascular: Normal rate, regular rhythm and normal  heart sounds.   Pulmonary/Chest: Effort normal and breath sounds normal. He exhibits no bony tenderness.  Musculoskeletal: Normal range of motion.  Neurological: He is alert and oriented to person, place, and time. No cranial nerve deficit or sensory deficit. He exhibits normal muscle tone. Coordination normal.  Skin: Skin is warm, dry and intact.  Psychiatric: He has a normal mood and affect. His behavior is normal. Judgment and thought content normal.  Nursing note and vitals reviewed.    ED Treatments / Results  Labs (all labs ordered are listed, but only abnormal results are  displayed) Labs Reviewed - No data to display  EKG  EKG Interpretation None       Radiology No results found.  Procedures Procedures (including critical care time)  Medications Ordered in ED Medications - No data to display   Initial Impression / Assessment and Plan / ED Course  I have reviewed the triage vital signs and the nursing notes.  Pertinent labs & imaging results that were available during my care of the patient were reviewed by me and considered in my medical decision making (see chart for details).  Clinical Course    Medications - No data to display  Patient Vitals for the past 24 hrs:  BP Temp Temp src Pulse Resp SpO2  04/13/16 0454 143/91 98.3 F (36.8 C) Oral 77 19 98 %    5:36 AM Reevaluation with update and discussion. After initial assessment and treatment, an updated evaluation reveals No change in clinical status. Findings discussed with patient, all questions answered. Yates Weisgerber L    Final Clinical Impressions(s) / ED Diagnoses   Final diagnoses:  Sinusitis, unspecified chronicity, unspecified location    Nonspecific sinus congestion with differential including viral, allergic and bacterial. Patient strongly advocates for medications, to treat infection.  Nursing Notes Reviewed/ Care Coordinated Applicable Imaging Reviewed Interpretation of Laboratory Data incorporated into ED treatment  The patient appears reasonably screened and/or stabilized for discharge and I doubt any other medical condition or other [facility] requiring further screening, evaluation, or treatment in the ED at this time prior to discharge.  Plan: Home Medications- continue; Home Treatments- rest; return here if the recommended treatment, does not improve the symptoms; Recommended follow up- PCP prn   New Prescriptions New Prescriptions   AMOXICILLIN (AMOXIL) 500 MG CAPSULE    Take 1 capsule (500 mg total) by mouth 3 (three) times daily.     Mancel BaleElliott Kyla Duffy,  MD 04/13/16 [phone]

## 2016-04-13 NOTE — ED Triage Notes (Signed)
Pt complains of a sinus infection for two days

## 2016-09-15 ENCOUNTER — Emergency Department (HOSPITAL_COMMUNITY)
Admission: EM | Admit: 2016-09-15 | Discharge: 2016-09-15 | Disposition: A | Discharge status: Home / Self Care | Payer: Medicare Other | Attending: Emergency Medicine | Admitting: Emergency Medicine

## 2016-09-15 ENCOUNTER — Emergency Department (HOSPITAL_COMMUNITY): Payer: Medicare Other

## 2016-09-15 ENCOUNTER — Encounter (HOSPITAL_COMMUNITY): Payer: Self-pay | Admitting: Emergency Medicine

## 2016-09-15 DIAGNOSIS — S0219XA Other fracture of base of skull, initial encounter for closed fracture: Secondary | ICD-10-CM | POA: Insufficient documentation

## 2016-09-15 DIAGNOSIS — F17210 Nicotine dependence, cigarettes, uncomplicated: Secondary | ICD-10-CM | POA: Insufficient documentation

## 2016-09-15 DIAGNOSIS — S0591XA Unspecified injury of right eye and orbit, initial encounter: Secondary | ICD-10-CM | POA: Diagnosis present

## 2016-09-15 DIAGNOSIS — Y939 Activity, unspecified: Secondary | ICD-10-CM | POA: Diagnosis not present

## 2016-09-15 DIAGNOSIS — Z7982 Long term (current) use of aspirin: Secondary | ICD-10-CM | POA: Diagnosis not present

## 2016-09-15 DIAGNOSIS — Z79899 Other long term (current) drug therapy: Secondary | ICD-10-CM | POA: Insufficient documentation

## 2016-09-15 DIAGNOSIS — S0269XA Fracture of mandible of other specified site, initial encounter for closed fracture: Secondary | ICD-10-CM | POA: Diagnosis not present

## 2016-09-15 DIAGNOSIS — W228XXA Striking against or struck by other objects, initial encounter: Secondary | ICD-10-CM | POA: Insufficient documentation

## 2016-09-15 DIAGNOSIS — Y929 Unspecified place or not applicable: Secondary | ICD-10-CM | POA: Insufficient documentation

## 2016-09-15 DIAGNOSIS — S0292XA Unspecified fracture of facial bones, initial encounter for closed fracture: Secondary | ICD-10-CM

## 2016-09-15 DIAGNOSIS — Y999 Unspecified external cause status: Secondary | ICD-10-CM | POA: Insufficient documentation

## 2016-09-15 MED ORDER — IBUPROFEN 600 MG PO TABS: 600.0000 mg | ORAL_TABLET | Freq: Four times a day (QID) | ORAL | 0 refills | Status: DC | PRN

## 2016-09-15 MED ORDER — HYDROCODONE-ACETAMINOPHEN 5-325 MG PO TABS: 1.0000 | ORAL_TABLET | ORAL | 0 refills | Status: DC | PRN
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
## 2017-05-15 ENCOUNTER — Emergency Department (HOSPITAL_COMMUNITY)
Admission: EM | Admit: 2017-05-15 | Discharge: 2017-05-15 | Disposition: A | Discharge status: Home / Self Care | Payer: Medicare Other | Attending: Emergency Medicine | Admitting: Emergency Medicine

## 2017-05-15 ENCOUNTER — Encounter (HOSPITAL_COMMUNITY): Payer: Self-pay | Admitting: *Deleted

## 2017-05-15 ENCOUNTER — Emergency Department (HOSPITAL_COMMUNITY): Payer: Medicare Other

## 2017-05-15 ENCOUNTER — Emergency Department (HOSPITAL_BASED_OUTPATIENT_CLINIC_OR_DEPARTMENT_OTHER)
Admit: 2017-05-15 | Discharge: 2017-05-15 | Disposition: A | Discharge status: Home / Self Care | Payer: Medicare Other | Attending: Emergency Medicine | Admitting: Emergency Medicine

## 2017-05-15 DIAGNOSIS — Z791 Long term (current) use of non-steroidal anti-inflammatories (NSAID): Secondary | ICD-10-CM | POA: Insufficient documentation

## 2017-05-15 DIAGNOSIS — M79609 Pain in unspecified limb: Secondary | ICD-10-CM | POA: Diagnosis not present

## 2017-05-15 DIAGNOSIS — Z7982 Long term (current) use of aspirin: Secondary | ICD-10-CM | POA: Diagnosis not present

## 2017-05-15 DIAGNOSIS — M7989 Other specified soft tissue disorders: Secondary | ICD-10-CM | POA: Diagnosis not present

## 2017-05-15 DIAGNOSIS — G8929 Other chronic pain: Secondary | ICD-10-CM | POA: Diagnosis not present

## 2017-05-15 DIAGNOSIS — M25561 Pain in right knee: Secondary | ICD-10-CM

## 2017-05-15 DIAGNOSIS — Z7983 Long term (current) use of bisphosphonates: Secondary | ICD-10-CM | POA: Diagnosis not present

## 2017-05-15 DIAGNOSIS — F17210 Nicotine dependence, cigarettes, uncomplicated: Secondary | ICD-10-CM | POA: Insufficient documentation

## 2017-05-15 DIAGNOSIS — Z79891 Long term (current) use of opiate analgesic: Secondary | ICD-10-CM | POA: Diagnosis not present

## 2017-05-15 DIAGNOSIS — Z7984 Long term (current) use of oral hypoglycemic drugs: Secondary | ICD-10-CM | POA: Insufficient documentation

## 2017-05-15 DIAGNOSIS — Z7952 Long term (current) use of systemic steroids: Secondary | ICD-10-CM | POA: Diagnosis not present

## 2017-05-15 DIAGNOSIS — M1711 Unilateral primary osteoarthritis, right knee: Secondary | ICD-10-CM | POA: Insufficient documentation

## 2017-05-15 DIAGNOSIS — Z79899 Other long term (current) drug therapy: Secondary | ICD-10-CM | POA: Diagnosis not present

## 2017-05-15 MED ORDER — OXYCODONE-ACETAMINOPHEN 5-325 MG PO TABS
1.0000 | ORAL_TABLET | Freq: Once | ORAL | Status: AC
  Administered 2017-05-15: 1 via ORAL
  Filled 2017-05-15: qty 1

## 2017-05-15 NOTE — ED Notes (Signed)
Vascular at bedside

## 2017-05-15 NOTE — ED Triage Notes (Signed)
Pt reports right knee pain that has been ongoing and has told he needs surgery. States he is having difficulty ambulating.

## 2017-05-15 NOTE — ED Provider Notes (Signed)
[facility] EMERGENCY DEPARTMENT Provider Note   CSN: [id number] Arrival date & time: 05/15/17  7829     History   Chief Complaint Chief Complaint  Patient presents with  . Knee Pain    HPI Peter Roth is a 56 y.o. male.  The history is provided by the patient. No language interpreter was used.  Knee Pain      Peter Roth is a 56 y.o. male who presents to the Emergency Department complaining of knee pain.  He has a long history of right knee pain and is followed by Dr. Luiz Blare with orthopedic surgery.  He is planning a surgery but does not have it scheduled yet and comes in for surgery today.  He reports increased pain in his right knee after receiving an injection 1 week ago.  He has fallen twice overnight.  He reports severe pain in the right knee that radiates down the front of his right leg.  He is afraid he is going to break his leg waiting for the surgery to happen.  He denies any fevers, chest pain, shortness of breath, swelling.  He is taking his home medications as prescribed but is not sure what they are.  Symptoms are severe and constant in nature.  Past Medical History:  Diagnosis Date  . Bipolar 1 disorder (HCC)   . High cholesterol     There are no active problems to display for this patient.   Past Surgical History:  Procedure Laterality Date  . NEPHRECTOMY TRANSPLANTED ORGAN         Home Medications    Prior to Admission medications   Medication Sig Start Date End Date Taking? Authorizing Provider  acetaminophen-codeine (TYLENOL #3) 300-30 MG tablet Take 1 tablet every 6 (six) hours as needed by mouth for moderate pain.   Yes [provider]  atorvastatin (LIPITOR) 40 MG tablet Take 40 mg by mouth daily. 06/23/15  Yes [provider]  divalproex (DEPAKOTE ER) 500 MG 24 hr tablet Take 1,000 mg by mouth at bedtime.   Yes [provider]  HYDROcodone-acetaminophen (NORCO/VICODIN) 5-325 MG tablet Take  1-2 tablets every 6 (six) hours as needed by mouth for moderate pain.   Yes [provider]  ibuprofen (ADVIL,MOTRIN) 600 MG tablet Take 1 tablet (600 mg total) by mouth every 6 (six) hours as needed. 09/15/16  Yes Jacalyn Lefevre, MD  lisinopril (PRINIVIL,ZESTRIL) 10 MG tablet Take 10 mg daily by mouth.   Yes [provider]  meloxicam (MOBIC) 15 MG tablet Take 15 mg daily as needed by mouth for pain.  02/01/14  Yes [provider]  methocarbamol (ROBAXIN) 750 MG tablet Take 750 mg 4 (four) times daily by mouth.   Yes [provider]  nabumetone (RELAFEN) 750 MG tablet Take 1,300 mg daily by mouth.   Yes [provider]  omeprazole (PRILOSEC) 40 MG capsule Take 40 mg daily by mouth.   Yes [provider]  sitaGLIPtin-metformin (JANUMET) 50-500 MG tablet Take 1 tablet 2 (two) times daily with a meal by mouth.   Yes [provider]  tizanidine (ZANAFLEX) 2 MG capsule Take 2 mg every 12 (twelve) hours by mouth.   Yes [provider]  traMADol (ULTRAM) 50 MG tablet Take 50 mg every 6 (six) hours as needed by mouth for moderate pain.   Yes [provider]  amoxicillin (AMOXIL) 500 MG capsule Take 1 capsule (500 mg total) by mouth 3 (three) times daily.  Patient not taking: Reported on 09/15/2016 04/13/16   Mancel BaleWentz, Elliott, MD  aspirin EC 81 MG tablet Take 81 mg by mouth daily.    [provider]  cephALEXin (KEFLEX) 500 MG capsule Take 1 capsule (500 mg total) by mouth 4 (four) times daily. Patient not taking: Reported on 06/05/2015 05/29/15   Mady GemmaWestfall, Melonee Gerstel C, PA-C  cyclobenzaprine (FLEXERIL) 10 MG tablet Take 1 tablet (10 mg total) by mouth 3 (three) times daily as needed for muscle spasms. Patient not taking: Reported on 09/15/2016 11/23/15   Street, [place]Mercedes, [place] JerseyPA-C  HYDROcodone-acetaminophen (NORCO/VICODIN) 5-325 MG tablet Take 1 tablet by mouth every 4 (four) hours as needed. 09/15/16   Jacalyn LefevreHaviland, Julie, MD    sertraline (ZOLOFT) 100 MG tablet Take 1 tablet by mouth daily. 02/27/14   [provider]    Family History History reviewed. No pertinent family history.  Social History Social History   Tobacco Use  . Smoking status: Current Every Day Smoker    Packs/day: 0.50    Types: Cigarettes  . Smokeless tobacco: Never Used  Substance Use Topics  . Alcohol use: No  . Drug use: No     Allergies   Sulfa antibiotics and Cephalosporins   Review of Systems Review of Systems  All other systems reviewed and are negative.    Physical Exam Updated Vital Signs BP (!) 163/94   Pulse 85   Temp 98.4 F (36.9 C) (Oral)   Resp 18   SpO2 99%   Physical Exam  Constitutional: He is oriented to person, place, and time. He appears well-developed and well-nourished.  HENT:  Head: Normocephalic and atraumatic.  Cardiovascular: Normal rate and regular rhythm.  No murmur heard. Pulmonary/Chest: Effort normal and breath sounds normal. No respiratory distress.  Abdominal: Soft. There is no tenderness. There is no rebound and no guarding.  Musculoskeletal: He exhibits no edema.  2+ DP pulses bilaterally.  There is 1+ pitting edema from the right knee to the ankle.  There is a healing injection site to the right lateral knee.  No palpable knee effusion.  There is mild diffuse tenderness throughout the knee without any overlying erythema.  Decreased range of motion of the right knee secondary to pain.  Neurological: He is alert and oriented to person, place, and time.  4 out of 5 strength in the right lower extremity, strength testing limited secondary to pain.  Sensation to light touch intact in all 4 extremities.  No facial asymmetry.  Skin: Skin is warm and dry.  Psychiatric:  Mildly agitated and anxious  Nursing note and vitals reviewed.    ED Treatments / Results  Labs (all labs ordered are listed, but only abnormal results are displayed) Labs Reviewed - No data to  display  EKG  EKG Interpretation None       Radiology Dg Knee Complete 4 Views Right  Result Date: 05/15/2017 CLINICAL DATA:  Several month history of right knee pain. The patient has undergone previous arthroscopy and arthrocentesis. Persistent instability and false. EXAM: RIGHT KNEE - COMPLETE 4+ VIEW COMPARISON:  Right knee series dated Nov 23, 2015 FINDINGS: The bones are subjectively adequately mineralized. There is moderate narrowing of the medial joint space with milder narrowing laterally and of the patellofemoral joint. There is beaking of the tibial spines. Spurs arise from the articular margins of the patella. There is no acute fracture or dislocation. There is a small suprapatellar effusion. IMPRESSION: Moderate osteoarthritic change centered on the medial compartment with milder changes  laterally and of the patellofemoral compartment. There is no acute fracture. There is a small suprapatellar effusion. Electronically Signed   By: David  Swazil[place] M.D.   On: 05/15/2017 08:51    Procedures Procedures (including critical care time)  Medications Ordered in ED Medications  oxyCODONE-acetaminophen (PERCOCET/ROXICET) 5-325 MG per tablet 1 tablet (1 tablet Oral Given 05/15/17 [id number])     Initial Impression / Assessment and Plan / ED Course  I have reviewed the triage vital signs and the nursing notes.  Pertinent labs & imaging results that were available during my care of the patient were reviewed by me and considered in my medical decision making (see chart for details).     Patient with history of knee pain followed by orthopedics here with falling twice after his knee buckled, worsening right knee pain.  He is neurovascularly intact on examination with some tenderness to his right knee.  No evidence of acute fracture.  No palpable effusion, presentation is not consistent with septic arthritis.  He does have some lower extremity edema, DVT study is negative.  Presentation is not  consistent with CVA, limb ischemia.  Discussed with patient home care for arthritis of the knee, question ligamentous instability.  Will place a knee immobilizer for comfort with outpatient orthopedics follow-up.  On repeat questioning patient was calm and appropriate and agreeable with plan.  Final Clinical Impressions(s) / ED Diagnoses   Final diagnoses:  Arthritis of right knee  Chronic pain of right knee    ED Discharge Orders    None       Tilden Fossaees, Leith Hedlund, MD 05/15/17 1519

## 2017-05-15 NOTE — Progress Notes (Signed)
Right lower extremity venous duplex has been completed. Negative for DVT. Results were given to Dr. Madilyn Hookees.  05/15/17 8:08 AM Olen CordialGreg Adasha Boehme RVT

## 2017-05-22 ENCOUNTER — Other Ambulatory Visit: Payer: Self-pay | Admitting: Orthopedic Surgery

## 2017-05-24 ENCOUNTER — Encounter (HOSPITAL_COMMUNITY): Payer: Self-pay

## 2017-05-24 NOTE — Patient Instructions (Signed)
Peter SchlatterChester K Roth  05/24/2017   Your procedure is scheduled on: 06/07/2017   [facility]  [facility]tr[facility] [place]  elevators to 3rd floor to  Short Stay Center at    0800 AM.    Call this number if you have problems the morning of surgery [phone]    Remember: ONLY 1 PERSON MAY GO WITH YOU TO SHORT STAY TO GET  READY MORNING OF YOUR SURGERY.  Do not eat food or drink liquids :After Midnight.     Take these medicines the morning of surgery with A SIP OF WATER: Depakote, prilosec, Zoloft  DO NOT TAKE ANY DIABETIC MEDICATIONS DAY OF YOUR SURGERY                               You may not have any metal on your body including hair pins and              piercings  Do not wear jewelry,  lotions, powders or perfumes, deodorant              Men may shave face and neck.   Do not bring valuables to the hospital. [facility] IS NOT             RESPONSIBLE   FOR VALUABLES.  Contacts, dentures or bridgework may not be worn into surgery.  Leave suitcase in the car. After surgery it may be brought to your room.                       Please read over the following fact sheets you were given: _____________________________________________________________________             [facility] - Preparing for Surgery Before surgery, you can play an important role.  Because skin is not sterile, your skin needs to be as free of germs as possible.  You can reduce the number of germs on your skin by washing with CHG (chlorahexidine gluconate) soap before surgery.  CHG is an antiseptic cleaner which kills germs and bonds with the skin to continue killing germs even after washing. Please DO NOT use if you have an allergy to CHG or antibacterial soaps.  If your skin becomes reddened/irritated stop using the CHG and inform your nurse when you arrive at Short Stay. Do not shave (including legs and underarms) for at least 48 hours prior to the first CHG shower.  You may  shave your face/neck. Please follow these instructions carefully:  1.  Shower with CHG Soap the night before surgery and the  morning of Surgery.  2.  If you choose to wash your hair, wash your hair first as usual with your  normal  shampoo.  3.  After you shampoo, rinse your hair and body thoroughly to remove the  shampoo.                           4.  Use CHG as you would any other liquid soap.  You can apply chg directly  to the skin and wash                       Gently with a scrungie or clean washcloth.  5.  Apply the CHG Soap to your body  ONLY FROM THE NECK DOWN.   Do not use on face/ open                           Wound or open sores. Avoid contact with eyes, ears mouth and genitals (private parts).                       Wash face,  Genitals (private parts) with your normal soap.             6.  Wash thoroughly, paying special attention to the area where your surgery  will be performed.  7.  Thoroughly rinse your body with warm water from the neck down.  8.  DO NOT shower/wash with your normal soap after using and rinsing off  the CHG Soap.                9.  Pat yourself dry with a clean towel.            10.  Wear clean pajamas.            11.  Place clean sheets on your bed the night of your first shower and do not  sleep with pets. Day of Surgery : Do not apply any lotions/deodorants the morning of surgery.  Please wear clean clothes to the hospital/surgery center.  FAILURE TO FOLLOW THESE INSTRUCTIONS MAY RESULT IN THE CANCELLATION OF YOUR SURGERY PATIENT SIGNATURE_________________________________  NURSE SIGNATURE__________________________________  ________________________________________________________________________  WHAT IS A BLOOD TRANSFUSION? Blood Transfusion Information  A transfusion is the replacement of blood or some of its parts. Blood is made up of multiple cells which provide different functions.  Red blood cells carry oxygen and are used for blood loss  replacement.  White blood cells fight against infection.  Platelets control bleeding.  Plasma helps clot blood.  Other blood products are available for specialized needs, such as hemophilia or other clotting disorders. BEFORE THE TRANSFUSION  Who gives blood for transfusions?   Healthy volunteers who are fully evaluated to make sure their blood is safe. This is blood bank blood. Transfusion therapy is the safest it has ever been in the practice of medicine. Before blood is taken from a donor, a complete history is taken to make sure that person has no history of diseases nor engages in risky social behavior (examples are intravenous drug use or sexual activity with multiple partners). The donor's travel history is screened to minimize risk of transmitting infections, such as malaria. The donated blood is tested for signs of infectious diseases, such as HIV and hepatitis. The blood is then tested to be sure it is compatible with you in order to minimize the chance of a transfusion reaction. If you or a relative donates blood, this is often done in anticipation of surgery and is not appropriate for emergency situations. It takes many days to process the donated blood. RISKS AND COMPLICATIONS Although transfusion therapy is very safe and saves many lives, the main dangers of transfusion include:   Getting an infectious disease.  Developing a transfusion reaction. This is an allergic reaction to something in the blood you were given. Every precaution is taken to prevent this. The decision to have a blood transfusion has been considered carefully by your caregiver before blood is given. Blood is not given unless the benefits outweigh the risks. AFTER THE TRANSFUSION  Right after receiving a blood transfusion, you will usually feel much  better and more energetic. This is especially true if your red blood cells have gotten low (anemic). The transfusion raises the level of the red blood cells which  carry oxygen, and this usually causes an energy increase.  The nurse administering the transfusion will monitor you carefully for complications. HOME CARE INSTRUCTIONS  No special instructions are needed after a transfusion. You may find your energy is better. Speak with your caregiver about any limitations on activity for underlying diseases you may have. SEEK MEDICAL CARE IF:   Your condition is not improving after your transfusion.  You develop redness or irritation at the intravenous (IV) site. SEEK IMMEDIATE MEDICAL CARE IF:  Any of the following symptoms occur over the next 12 hours:  Shaking chills.  You have a temperature by mouth above 102 F (38.9 C), not controlled by medicine.  Chest, back, or muscle pain.  People around you feel you are not acting correctly or are confused.  Shortness of breath or difficulty breathing.  Dizziness and fainting.  You get a rash or develop hives.  You have a decrease in urine output.  Your urine turns a dark color or changes to pink, red, or brown. Any of the following symptoms occur over the next 10 days:  You have a temperature by mouth above 102 F (38.9 C), not controlled by medicine.  Shortness of breath.  Weakness after normal activity.  The white part of the eye turns yellow (jaundice).  You have a decrease in the amount of urine or are urinating less often.  Your urine turns a dark color or changes to pink, red, or brown. Document Released: 06/22/2000 Document Revised: 09/17/2011 Document Reviewed: 02/09/2008 ExitCare Patient Information 2014 [place].  _______________________________________________________________________  Incentive Spirometer  An incentive spirometer is a tool that can help keep your lungs clear and active. This tool measures how well you are filling your lungs with each breath. Taking long deep breaths may help reverse or decrease the chance of developing breathing (pulmonary) problems  (especially infection) following:  A long period of time when you are unable to move or be active. BEFORE THE PROCEDURE   If the spirometer includes an indicator to show your best effort, your nurse or respiratory therapist will set it to a desired goal.  If possible, sit up straight or lean slightly forward. Try not to slouch.  Hold the incentive spirometer in an upright position. INSTRUCTIONS FOR USE  1. Sit on the edge of your bed if possible, or sit up as far as you can in bed or on a chair. 2. Hold the incentive spirometer in an upright position. 3. Breathe out normally. 4. Place the mouthpiece in your mouth and seal your lips tightly around it. 5. Breathe in slowly and as deeply as possible, raising the piston or the ball toward the top of the column. 6. Hold your breath for 3-5 seconds or for as long as possible. Allow the piston or ball to fall to the bottom of the column. 7. Remove the mouthpiece from your mouth and breathe out normally. 8. Rest for a few seconds and repeat Steps 1 through 7 at least 10 times every 1-2 hours when you are awake. Take your time and take a few normal breaths between deep breaths. 9. The spirometer may include an indicator to show your best effort. Use the indicator as a goal to work toward during each repetition. 10. After each set of 10 deep breaths, practice coughing to be sure  your lungs are clear. If you have an incision (the cut made at the time of surgery), support your incision when coughing by placing a pillow or rolled up towels firmly against it. Once you are able to get out of bed, walk around indoors and cough well. You may stop using the incentive spirometer when instructed by your caregiver.  RISKS AND COMPLICATIONS  Take your time so you do not get dizzy or light-headed.  If you are in pain, you may need to take or ask for pain medication before doing incentive spirometry. It is harder to take a deep breath if you are having  pain. AFTER USE  Rest and breathe slowly and easily.  It can be helpful to keep track of a log of your progress. Your caregiver can provide you with a simple table to help with this. If you are using the spirometer at home, follow these instructions: SEEK MEDICAL CARE IF:   You are having difficultly using the spirometer.  You have trouble using the spirometer as often as instructed.  Your pain medication is not giving enough relief while using the spirometer.  You develop fever of 100.5 F (38.1 C) or higher. SEEK IMMEDIATE MEDICAL CARE IF:   You cough up bloody sputum that had not been present before.  You develop fever of 102 F (38.9 C) or greater.  You develop worsening pain at or near the incision site. MAKE SURE YOU:   Understand these instructions.  Will watch your condition.  Will get help right away if you are not doing well or get worse. Document Released: 11/05/2006 Document Revised: 09/17/2011 Document Reviewed: 01/06/2007 ExitCare Patient Information 2014 [employer], [place].   ________________________________________________________________________ How to Manage Your Diabetes Before and After Surgery  Why is it important to control my blood sugar before and after surgery? . Improving blood sugar levels before and after surgery helps healing and can limit problems. . A way of improving blood sugar control is eating a healthy diet by: o  Eating less sugar and carbohydrates o  Increasing activity/exercise o  Talking with your doctor about reaching your blood sugar goals . High blood sugars (greater than 180 mg/dL) can raise your risk of infections and slow your recovery, so you will need to focus on controlling your diabetes during the weeks before surgery. . Make sure that the doctor who takes care of your diabetes knows about your planned surgery including the date and location.  How do I manage my blood sugar before surgery? . Check your blood sugar at  least 4 times a day, starting 2 days before surgery, to make sure that the level is not too high or low. o Check your blood sugar the morning of your surgery when you wake up and every 2 hours until you get to the Short Stay unit. . If your blood sugar is less than 70 mg/dL, you will need to treat for low blood sugar: o Do not take insulin. o Treat a low blood sugar (less than 70 mg/dL) with  cup of clear juice (cranberry or apple), 4 glucose tablets, OR glucose gel. o Recheck blood sugar in 15 minutes after treatment (to make sure it is greater than 70 mg/dL). If your blood sugar is not greater than 70 mg/dL on recheck, call [phone] for further instructions. . Report your blood sugar to the short stay nurse when you get to Short Stay.  . If you are admitted to the hospital after surgery: o Your blood  sugar will be checked by the staff and you will probably be given insulin after surgery (instead of oral diabetes medicines) to make sure you have good blood sugar levels. o The goal for blood sugar control after surgery is 80-180 mg/dL.   WHAT DO I DO ABOUT MY DIABETES MEDICATION?  Marland Kitchen Do not take oral diabetes medicines (pills) the morning of surgery.  .       .   .   .  .   Patient Signature:  Date:   Nurse Signature:  Date:   Reviewed and Endorsed by [facility] Patient Education Committee, August 2015

## 2017-05-24 NOTE — Progress Notes (Signed)
LOV NOTE DR CHINNASAMY 05-14-17 CHART  LABS 05-14-17 HGBA1C , CBC/DIFF, CMP, VALPROIC ACID, UA ALL ON CHART  EKG 10-09-16 ON CHART

## 2017-05-27 ENCOUNTER — Ambulatory Visit (HOSPITAL_COMMUNITY)
Admission: RE | Admit: 2017-05-27 | Discharge: 2017-05-27 | Disposition: A | Discharge status: Home / Self Care | Payer: Medicare Other | Source: Ambulatory Visit | Attending: Orthopedic Surgery | Admitting: Orthopedic Surgery

## 2017-05-27 ENCOUNTER — Encounter (HOSPITAL_COMMUNITY): Payer: Self-pay

## 2017-05-27 ENCOUNTER — Encounter (HOSPITAL_COMMUNITY)
Admission: RE | Admit: 2017-05-27 | Discharge: 2017-05-27 | Disposition: A | Discharge status: Home / Self Care | Payer: Medicare Other | Source: Ambulatory Visit | Attending: Orthopedic Surgery | Admitting: Orthopedic Surgery

## 2017-05-27 ENCOUNTER — Other Ambulatory Visit: Payer: Self-pay

## 2017-05-27 DIAGNOSIS — Z01818 Encounter for other preprocedural examination: Secondary | ICD-10-CM | POA: Insufficient documentation

## 2017-05-27 DIAGNOSIS — M1711 Unilateral primary osteoarthritis, right knee: Secondary | ICD-10-CM | POA: Insufficient documentation

## 2017-05-27 DIAGNOSIS — Z01812 Encounter for preprocedural laboratory examination: Secondary | ICD-10-CM | POA: Insufficient documentation

## 2017-05-27 HISTORY — DX: Essential (primary) hypertension: I10

## 2017-05-27 HISTORY — DX: Unspecified osteoarthritis, unspecified site: M19.90

## 2017-05-27 LAB — ABO/RH: ABO/RH(D): A NEG

## 2017-05-27 LAB — SURGICAL PCR SCREEN
MRSA, PCR: NEGATIVE
Staphylococcus aureus: NEGATIVE

## 2017-05-27 LAB — TYPE AND SCREEN
ABO/RH(D): A NEG
ANTIBODY SCREEN: NEGATIVE

## 2017-05-27 LAB — PROTIME-INR
INR: 1
Prothrombin Time: 13.1 seconds (ref 11.4–15.2)

## 2017-05-27 LAB — APTT: APTT: 31 s (ref 24–36)

## 2017-05-27 LAB — GLUCOSE, CAPILLARY: Glucose-Capillary: 103 mg/dL — ABNORMAL HIGH (ref 65–99)

## 2017-06-07 ENCOUNTER — Inpatient Hospital Stay (HOSPITAL_COMMUNITY)
Admission: RE | Admit: 2017-06-07 | Discharge: 2017-06-09 | DRG: 470 | Disposition: A | Discharge status: Home Health | Payer: Medicare Other | Source: Ambulatory Visit | Attending: Orthopedic Surgery | Admitting: Orthopedic Surgery

## 2017-06-07 ENCOUNTER — Inpatient Hospital Stay (HOSPITAL_COMMUNITY): Payer: Medicare Other | Admitting: Anesthesiology

## 2017-06-07 ENCOUNTER — Encounter (HOSPITAL_COMMUNITY): Payer: Self-pay | Admitting: Anesthesiology

## 2017-06-07 ENCOUNTER — Encounter (HOSPITAL_COMMUNITY)
Admission: RE | Disposition: A | Discharge status: Home Health | Payer: Self-pay | Source: Ambulatory Visit | Attending: Orthopedic Surgery

## 2017-06-07 ENCOUNTER — Other Ambulatory Visit: Payer: Self-pay

## 2017-06-07 DIAGNOSIS — E7800 Pure hypercholesterolemia, unspecified: Secondary | ICD-10-CM | POA: Diagnosis present

## 2017-06-07 DIAGNOSIS — Z7982 Long term (current) use of aspirin: Secondary | ICD-10-CM | POA: Diagnosis not present

## 2017-06-07 DIAGNOSIS — E119 Type 2 diabetes mellitus without complications: Secondary | ICD-10-CM | POA: Diagnosis present

## 2017-06-07 DIAGNOSIS — M25462 Effusion, left knee: Secondary | ICD-10-CM | POA: Diagnosis present

## 2017-06-07 DIAGNOSIS — I10 Essential (primary) hypertension: Secondary | ICD-10-CM | POA: Diagnosis present

## 2017-06-07 DIAGNOSIS — M1711 Unilateral primary osteoarthritis, right knee: Secondary | ICD-10-CM | POA: Diagnosis present

## 2017-06-07 DIAGNOSIS — F319 Bipolar disorder, unspecified: Secondary | ICD-10-CM | POA: Diagnosis present

## 2017-06-07 DIAGNOSIS — M1712 Unilateral primary osteoarthritis, left knee: Secondary | ICD-10-CM | POA: Diagnosis present

## 2017-06-07 DIAGNOSIS — F17210 Nicotine dependence, cigarettes, uncomplicated: Secondary | ICD-10-CM | POA: Diagnosis present

## 2017-06-07 DIAGNOSIS — M25561 Pain in right knee: Secondary | ICD-10-CM | POA: Diagnosis present

## 2017-06-07 HISTORY — PX: TOTAL KNEE ARTHROPLASTY: SHX125

## 2017-06-07 LAB — GLUCOSE, CAPILLARY
GLUCOSE-CAPILLARY: 104 mg/dL — AB (ref 65–99)
GLUCOSE-CAPILLARY: 88 mg/dL (ref 65–99)
GLUCOSE-CAPILLARY: 232 mg/dL — AB (ref 65–99)
GLUCOSE-CAPILLARY: 222 mg/dL — AB (ref 65–99)
Glucose-Capillary: 161 mg/dL — ABNORMAL HIGH (ref 65–99)

## 2017-06-07 SURGERY — ARTHROPLASTY, KNEE, TOTAL
Anesthesia: Monitor Anesthesia Care | Site: Knee | Laterality: Right

## 2017-06-07 MED ORDER — METHOCARBAMOL 1000 MG/10ML IJ SOLN
500.0000 mg | Freq: Four times a day (QID) | INTRAVENOUS | Status: DC | PRN
  Administered 2017-06-07: 500 mg via INTRAVENOUS
  Filled 2017-06-07: qty 550

## 2017-06-07 MED ORDER — TRANEXAMIC ACID 1000 MG/10ML IV SOLN
1000.0000 mg | Freq: Once | INTRAVENOUS | Status: AC
  Administered 2017-06-07: 1000 mg via INTRAVENOUS
  Filled 2017-06-07: qty 1100

## 2017-06-07 MED ORDER — ACETAMINOPHEN 325 MG PO TABS: 325.0000 mg | ORAL_TABLET | ORAL | Status: DC | PRN

## 2017-06-07 MED ORDER — LACTATED RINGERS IV SOLN
INTRAVENOUS | Status: DC
  Administered 2017-06-07 (×3): via INTRAVENOUS

## 2017-06-07 MED ORDER — INSULIN ASPART 100 UNIT/ML ~~LOC~~ SOLN
0.0000 [IU] | Freq: Three times a day (TID) | SUBCUTANEOUS | Status: DC
  Administered 2017-06-07: 19:00:00 5 [IU] via SUBCUTANEOUS
  Administered 2017-06-08: 3 [IU] via SUBCUTANEOUS
  Administered 2017-06-08: 5 [IU] via SUBCUTANEOUS
  Administered 2017-06-08: 08:00:00 3 [IU] via SUBCUTANEOUS

## 2017-06-07 MED ORDER — OXYCODONE HCL 5 MG/5ML PO SOLN: 5.0000 mg | Freq: Once | ORAL | Status: DC | PRN

## 2017-06-07 MED ORDER — SODIUM CHLORIDE 0.9 % IR SOLN
Status: DC | PRN
  Administered 2017-06-07: 1000 mL

## 2017-06-07 MED ORDER — DEXAMETHASONE SODIUM PHOSPHATE 10 MG/ML IJ SOLN
INTRAMUSCULAR | Status: AC
  Filled 2017-06-07: qty 1

## 2017-06-07 MED ORDER — SITAGLIPTIN PHOS-METFORMIN HCL 50-500 MG PO TABS: 1.0000 | ORAL_TABLET | Freq: Two times a day (BID) | ORAL | Status: DC

## 2017-06-07 MED ORDER — PHENYLEPHRINE 40 MCG/ML (10ML) SYRINGE FOR IV PUSH (FOR BLOOD PRESSURE SUPPORT)
PREFILLED_SYRINGE | INTRAVENOUS | Status: AC
  Filled 2017-06-07: qty 10

## 2017-06-07 MED ORDER — ONDANSETRON HCL 4 MG/2ML IJ SOLN
4.0000 mg | Freq: Four times a day (QID) | INTRAMUSCULAR | Status: DC | PRN
  Filled 2017-06-07: qty 2

## 2017-06-07 MED ORDER — DEXAMETHASONE SODIUM PHOSPHATE 10 MG/ML IJ SOLN
INTRAMUSCULAR | Status: AC
Start: 2017-06-07 — End: 2017-06-07
  Filled 2017-06-07: qty 1

## 2017-06-07 MED ORDER — LINAGLIPTIN 5 MG PO TABS
5.0000 mg | ORAL_TABLET | Freq: Every day | ORAL | Status: DC
  Administered 2017-06-07 – 2017-06-09 (×3): 5 mg via ORAL
  Filled 2017-06-07 (×3): qty 1

## 2017-06-07 MED ORDER — ONDANSETRON HCL 4 MG PO TABS: 4.0000 mg | ORAL_TABLET | Freq: Four times a day (QID) | ORAL | Status: DC | PRN

## 2017-06-07 MED ORDER — LISINOPRIL 10 MG PO TABS
10.0000 mg | ORAL_TABLET | Freq: Every day | ORAL | Status: DC
  Administered 2017-06-08 – 2017-06-09 (×2): 10 mg via ORAL
  Filled 2017-06-07 (×2): qty 1

## 2017-06-07 MED ORDER — NICOTINE 14 MG/24HR TD PT24
14.0000 mg | MEDICATED_PATCH | Freq: Every day | TRANSDERMAL | Status: DC
  Administered 2017-06-07 – 2017-06-09 (×3): 14 mg via TRANSDERMAL
  Filled 2017-06-07 (×3): qty 1

## 2017-06-07 MED ORDER — HYDROMORPHONE HCL 1 MG/ML IJ SOLN
0.5000 mg | INTRAMUSCULAR | Status: DC | PRN
  Administered 2017-06-08 (×3): 1 mg via INTRAVENOUS
  Filled 2017-06-07 (×3): qty 1

## 2017-06-07 MED ORDER — ZOLPIDEM TARTRATE 5 MG PO TABS
5.0000 mg | ORAL_TABLET | Freq: Every evening | ORAL | Status: DC | PRN
  Administered 2017-06-07 – 2017-06-09 (×2): 5 mg via ORAL
  Filled 2017-06-07 (×2): qty 1

## 2017-06-07 MED ORDER — ALUM & MAG HYDROXIDE-SIMETH 200-200-20 MG/5ML PO SUSP: 30.0000 mL | ORAL | Status: DC | PRN

## 2017-06-07 MED ORDER — FENTANYL CITRATE (PF) 100 MCG/2ML IJ SOLN: 25.0000 ug | INTRAMUSCULAR | Status: DC | PRN

## 2017-06-07 MED ORDER — POLYETHYLENE GLYCOL 3350 17 G PO PACK: 17.0000 g | PACK | Freq: Every day | ORAL | Status: DC | PRN

## 2017-06-07 MED ORDER — ACETAMINOPHEN 160 MG/5ML PO SOLN: 325.0000 mg | ORAL | Status: DC | PRN

## 2017-06-07 MED ORDER — ONDANSETRON HCL 4 MG/2ML IJ SOLN
INTRAMUSCULAR | Status: DC | PRN
  Administered 2017-06-07: 4 mg via INTRAVENOUS

## 2017-06-07 MED ORDER — PROPOFOL 10 MG/ML IV BOLUS
INTRAVENOUS | Status: AC
  Filled 2017-06-07: qty 60

## 2017-06-07 MED ORDER — BUPIVACAINE LIPOSOME 1.3 % IJ SUSP
INTRAMUSCULAR | Status: DC | PRN
  Administered 2017-06-07: 20 mL

## 2017-06-07 MED ORDER — FENTANYL CITRATE (PF) 100 MCG/2ML IJ SOLN
100.0000 ug | Freq: Once | INTRAMUSCULAR | Status: AC
  Administered 2017-06-07 (×2): 50 ug via INTRAVENOUS

## 2017-06-07 MED ORDER — OXYCODONE-ACETAMINOPHEN 5-325 MG PO TABS: 1.0000 | ORAL_TABLET | ORAL | 0 refills | Status: DC | PRN

## 2017-06-07 MED ORDER — PROPOFOL 500 MG/50ML IV EMUL
INTRAVENOUS | Status: DC | PRN
  Administered 2017-06-07: 75 ug/kg/min via INTRAVENOUS

## 2017-06-07 MED ORDER — OXYCODONE HCL 5 MG PO TABS
5.0000 mg | ORAL_TABLET | ORAL | Status: DC | PRN
  Administered 2017-06-07 (×2): 5 mg via ORAL
  Administered 2017-06-07 – 2017-06-09 (×7): 10 mg via ORAL
  Filled 2017-06-07 (×2): qty 2
  Filled 2017-06-07: qty 1
  Filled 2017-06-07 (×4): qty 2
  Filled 2017-06-07: qty 1
  Filled 2017-06-07: qty 2

## 2017-06-07 MED ORDER — GABAPENTIN 300 MG PO CAPS
300.0000 mg | ORAL_CAPSULE | Freq: Two times a day (BID) | ORAL | Status: DC
  Administered 2017-06-07 – 2017-06-09 (×5): 300 mg via ORAL
  Filled 2017-06-07 (×5): qty 1

## 2017-06-07 MED ORDER — OXYCODONE HCL 5 MG PO TABS: 5.0000 mg | ORAL_TABLET | Freq: Once | ORAL | Status: DC | PRN

## 2017-06-07 MED ORDER — BUPIVACAINE IN DEXTROSE 0.75-8.25 % IT SOLN
INTRATHECAL | Status: DC | PRN
  Administered 2017-06-07: 2 mL via INTRATHECAL

## 2017-06-07 MED ORDER — BISACODYL 5 MG PO TBEC: 5.0000 mg | DELAYED_RELEASE_TABLET | Freq: Every day | ORAL | Status: DC | PRN

## 2017-06-07 MED ORDER — METHYLPREDNISOLONE ACETATE 80 MG/ML IJ SUSP
INTRAMUSCULAR | Status: DC | PRN
  Administered 2017-06-07: 6 mL via INTRA_ARTICULAR

## 2017-06-07 MED ORDER — TIZANIDINE HCL 2 MG PO TABS: 2.0000 mg | ORAL_TABLET | Freq: Three times a day (TID) | ORAL | 0 refills | Status: DC | PRN

## 2017-06-07 MED ORDER — ACETAMINOPHEN 650 MG RE SUPP: 650.0000 mg | RECTAL | Status: DC | PRN

## 2017-06-07 MED ORDER — DOCUSATE SODIUM 100 MG PO CAPS: 100.0000 mg | ORAL_CAPSULE | Freq: Two times a day (BID) | ORAL | 0 refills | Status: AC

## 2017-06-07 MED ORDER — DEXAMETHASONE SODIUM PHOSPHATE 10 MG/ML IJ SOLN
10.0000 mg | Freq: Two times a day (BID) | INTRAMUSCULAR | Status: AC
  Administered 2017-06-07 (×2): 10 mg via INTRAVENOUS
  Filled 2017-06-07 (×2): qty 1

## 2017-06-07 MED ORDER — CLINDAMYCIN PHOSPHATE 900 MG/50ML IV SOLN
900.0000 mg | INTRAVENOUS | Status: AC
  Administered 2017-06-07: 900 mg via INTRAVENOUS
  Filled 2017-06-07 (×3): qty 50

## 2017-06-07 MED ORDER — DEXAMETHASONE SODIUM PHOSPHATE 10 MG/ML IJ SOLN
INTRAMUSCULAR | Status: DC | PRN
  Administered 2017-06-07: 10 mg via INTRAVENOUS

## 2017-06-07 MED ORDER — ASPIRIN EC 325 MG PO TBEC
325.0000 mg | DELAYED_RELEASE_TABLET | Freq: Two times a day (BID) | ORAL | Status: DC
  Administered 2017-06-07 – 2017-06-09 (×4): 325 mg via ORAL
  Filled 2017-06-07 (×4): qty 1

## 2017-06-07 MED ORDER — ATORVASTATIN CALCIUM 40 MG PO TABS
40.0000 mg | ORAL_TABLET | Freq: Every day | ORAL | Status: DC
  Administered 2017-06-08 – 2017-06-09 (×2): 40 mg via ORAL
  Filled 2017-06-07 (×2): qty 1

## 2017-06-07 MED ORDER — METHYLPREDNISOLONE ACETATE 40 MG/ML IJ SUSP
INTRAMUSCULAR | Status: AC
  Filled 2017-06-07: qty 2

## 2017-06-07 MED ORDER — DIVALPROEX SODIUM ER 500 MG PO TB24
1000.0000 mg | ORAL_TABLET | Freq: Every day | ORAL | Status: DC
  Administered 2017-06-07 – 2017-06-08 (×2): 1000 mg via ORAL
  Filled 2017-06-07 (×2): qty 2

## 2017-06-07 MED ORDER — CHLORHEXIDINE GLUCONATE 4 % EX LIQD: 60.0000 mL | Freq: Once | CUTANEOUS | Status: DC

## 2017-06-07 MED ORDER — CLINDAMYCIN PHOSPHATE 600 MG/50ML IV SOLN
600.0000 mg | Freq: Four times a day (QID) | INTRAVENOUS | Status: AC
  Administered 2017-06-07 (×2): 600 mg via INTRAVENOUS
  Filled 2017-06-07 (×3): qty 50

## 2017-06-07 MED ORDER — MIDAZOLAM HCL 2 MG/2ML IJ SOLN
INTRAMUSCULAR | Status: AC
  Administered 2017-06-07: 1 mg via INTRAVENOUS
  Filled 2017-06-07: qty 2

## 2017-06-07 MED ORDER — MIDAZOLAM HCL 2 MG/2ML IJ SOLN
2.0000 mg | Freq: Once | INTRAMUSCULAR | Status: AC
  Administered 2017-06-07 (×2): 1 mg via INTRAVENOUS

## 2017-06-07 MED ORDER — ACETAMINOPHEN 325 MG PO TABS
650.0000 mg | ORAL_TABLET | ORAL | Status: DC | PRN
  Administered 2017-06-07: 16:00:00 650 mg via ORAL
  Filled 2017-06-07: qty 2

## 2017-06-07 MED ORDER — TRANEXAMIC ACID 1000 MG/10ML IV SOLN
1000.0000 mg | INTRAVENOUS | Status: AC
  Administered 2017-06-07: 1000 mg via INTRAVENOUS
  Filled 2017-06-07: qty 1100

## 2017-06-07 MED ORDER — EPHEDRINE 5 MG/ML INJ
INTRAVENOUS | Status: AC
  Filled 2017-06-07: qty 10

## 2017-06-07 MED ORDER — STERILE WATER FOR IRRIGATION IR SOLN
Status: DC | PRN
  Administered 2017-06-07: 2000 mL

## 2017-06-07 MED ORDER — BUPIVACAINE HCL (PF) 0.25 % IJ SOLN
INTRAMUSCULAR | Status: AC
  Filled 2017-06-07: qty 30

## 2017-06-07 MED ORDER — ROPIVACAINE HCL 7.5 MG/ML IJ SOLN
INTRAMUSCULAR | Status: DC | PRN
  Administered 2017-06-07: 20 mL via PERINEURAL

## 2017-06-07 MED ORDER — METFORMIN HCL 500 MG PO TABS
500.0000 mg | ORAL_TABLET | Freq: Two times a day (BID) | ORAL | Status: DC
  Administered 2017-06-07 – 2017-06-09 (×4): 500 mg via ORAL
  Filled 2017-06-07 (×4): qty 1

## 2017-06-07 MED ORDER — DIPHENHYDRAMINE HCL 12.5 MG/5ML PO ELIX
12.5000 mg | ORAL_SOLUTION | ORAL | Status: DC | PRN
  Administered 2017-06-07: 12.5 mg via ORAL
  Filled 2017-06-07: qty 5

## 2017-06-07 MED ORDER — TRANEXAMIC ACID 1000 MG/10ML IV SOLN
2000.0000 mg | INTRAVENOUS | Status: DC
  Filled 2017-06-07: qty 20

## 2017-06-07 MED ORDER — METHOCARBAMOL 500 MG PO TABS
500.0000 mg | ORAL_TABLET | Freq: Four times a day (QID) | ORAL | Status: DC | PRN
  Administered 2017-06-07 – 2017-06-09 (×4): 500 mg via ORAL
  Filled 2017-06-07 (×4): qty 1

## 2017-06-07 MED ORDER — ASPIRIN EC 325 MG PO TBEC
325.0000 mg | DELAYED_RELEASE_TABLET | Freq: Two times a day (BID) | ORAL | 0 refills | Status: DC
Start: 2017-06-07 — End: 2017-08-24

## 2017-06-07 MED ORDER — SODIUM CHLORIDE 0.9 % IV SOLN
INTRAVENOUS | Status: DC
  Administered 2017-06-07: 15:00:00 via INTRAVENOUS

## 2017-06-07 MED ORDER — BUPIVACAINE HCL (PF) 0.25 % IJ SOLN
INTRAMUSCULAR | Status: DC | PRN
  Administered 2017-06-07: 20 mL

## 2017-06-07 MED ORDER — ONDANSETRON HCL 4 MG/2ML IJ SOLN
INTRAMUSCULAR | Status: AC
  Filled 2017-06-07: qty 2

## 2017-06-07 MED ORDER — DOCUSATE SODIUM 100 MG PO CAPS
100.0000 mg | ORAL_CAPSULE | Freq: Two times a day (BID) | ORAL | Status: DC
  Administered 2017-06-07 – 2017-06-09 (×3): 100 mg via ORAL
  Filled 2017-06-07 (×4): qty 1

## 2017-06-07 MED ORDER — FENTANYL CITRATE (PF) 100 MCG/2ML IJ SOLN
INTRAMUSCULAR | Status: AC
  Administered 2017-06-07: 50 ug via INTRAVENOUS
  Filled 2017-06-07: qty 2

## 2017-06-07 MED ORDER — PANTOPRAZOLE SODIUM 40 MG PO TBEC
80.0000 mg | DELAYED_RELEASE_TABLET | Freq: Every day | ORAL | Status: DC
  Administered 2017-06-07 – 2017-06-09 (×3): 80 mg via ORAL
  Filled 2017-06-07 (×3): qty 2

## 2017-06-07 MED ORDER — PHENYLEPHRINE 40 MCG/ML (10ML) SYRINGE FOR IV PUSH (FOR BLOOD PRESSURE SUPPORT)
PREFILLED_SYRINGE | INTRAVENOUS | Status: DC | PRN
  Administered 2017-06-07: 40 ug via INTRAVENOUS
  Administered 2017-06-07: 80 ug via INTRAVENOUS
  Administered 2017-06-07 (×4): 40 ug via INTRAVENOUS
  Administered 2017-06-07: 80 ug via INTRAVENOUS
  Administered 2017-06-07 (×2): 40 ug via INTRAVENOUS
  Administered 2017-06-07: 80 ug via INTRAVENOUS
  Administered 2017-06-07: 40 ug via INTRAVENOUS

## 2017-06-07 MED ORDER — BUPIVACAINE LIPOSOME 1.3 % IJ SUSP
20.0000 mL | Freq: Once | INTRAMUSCULAR | Status: DC
  Filled 2017-06-07: qty 20

## 2017-06-07 MED ORDER — MAGNESIUM CITRATE PO SOLN: 1.0000 | Freq: Once | ORAL | Status: DC | PRN

## 2017-06-07 SURGICAL SUPPLY — 56 items
APL SKNCLS STERI-STRIP NONHPOA (GAUZE/BANDAGES/DRESSINGS) ×1
BAG SPEC THK2 15X12 ZIP CLS (MISCELLANEOUS) ×1
BAG ZIPLOCK 12X15 (MISCELLANEOUS) ×3 IMPLANT
BANDAGE ACE 4X5 VEL STRL LF (GAUZE/BANDAGES/DRESSINGS) ×3 IMPLANT
BANDAGE ACE 6X5 VEL STRL LF (GAUZE/BANDAGES/DRESSINGS) ×3 IMPLANT
BENZOIN TINCTURE PRP APPL 2/3 (GAUZE/BANDAGES/DRESSINGS) ×3 IMPLANT
BLADE SAG 18X100X1.27 (BLADE) ×3 IMPLANT
BLADE SAW SGTL 13.0X1.19X90.0M (BLADE) ×3 IMPLANT
BOOTIES KNEE HIGH SLOAN (MISCELLANEOUS) ×3 IMPLANT
BOWL SMART MIX CTS (DISPOSABLE) ×3 IMPLANT
CAPT KNEE TOTAL 3 ATTUNE ×2 IMPLANT
CEMENT HV SMART SET (Cement) ×6 IMPLANT
CLOSURE WOUND 1/2 X4 (GAUZE/BANDAGES/DRESSINGS) ×1
CUFF TOURN SGL QUICK 34 (TOURNIQUET CUFF) ×3
CUFF TRNQT CYL 34X4X40X1 (TOURNIQUET CUFF) ×1 IMPLANT
DRAPE U-SHAPE 47X51 STRL (DRAPES) ×3 IMPLANT
DRSG ADAPTIC 3X8 NADH LF (GAUZE/BANDAGES/DRESSINGS) ×3 IMPLANT
DRSG AQUACEL AG ADV 3.5X10 (GAUZE/BANDAGES/DRESSINGS) ×3 IMPLANT
DRSG MEPILEX BORDER 4X8 (GAUZE/BANDAGES/DRESSINGS) ×3 IMPLANT
DRSG PAD ABDOMINAL 8X10 ST (GAUZE/BANDAGES/DRESSINGS) ×3 IMPLANT
DURAPREP 26ML APPLICATOR (WOUND CARE) ×6 IMPLANT
ELECT REM PT RETURN 15FT ADLT (MISCELLANEOUS) ×3 IMPLANT
GAUZE SPONGE 4X4 12PLY STRL (GAUZE/BANDAGES/DRESSINGS) ×3 IMPLANT
GLOVE BIOGEL PI IND STRL 8 (GLOVE) ×2 IMPLANT
GLOVE BIOGEL PI INDICATOR 8 (GLOVE) ×4
GLOVE ECLIPSE 7.5 STRL STRAW (GLOVE) ×3 IMPLANT
GOWN STRL REUS W/TWL XL LVL3 (GOWN DISPOSABLE) ×6 IMPLANT
HANDPIECE INTERPULSE COAX TIP (DISPOSABLE) ×3
HOOD PEEL AWAY FLYTE STAYCOOL (MISCELLANEOUS) ×9 IMPLANT
IMMOBILIZER KNEE 20 (SOFTGOODS) ×3 IMPLANT
IMMOBILIZER KNEE 20 THIGH 36 (SOFTGOODS) IMPLANT
MANIFOLD NEPTUNE II (INSTRUMENTS) ×3 IMPLANT
NS IRRIG 1000ML POUR BTL (IV SOLUTION) ×3 IMPLANT
PACK ICE MAXI GEL EZY WRAP (MISCELLANEOUS) ×3 IMPLANT
PACK TOTAL KNEE CUSTOM (KITS) ×3 IMPLANT
PADDING CAST ABS 6INX4YD NS (CAST SUPPLIES) ×2
PADDING CAST ABS COTTON 6X4 NS (CAST SUPPLIES) IMPLANT
PADDING CAST COTTON 6X4 STRL (CAST SUPPLIES) ×6 IMPLANT
POSITIONER SURGICAL ARM (MISCELLANEOUS) ×3 IMPLANT
SET HNDPC FAN SPRY TIP SCT (DISPOSABLE) ×1 IMPLANT
STAPLER VISISTAT 35W (STAPLE) IMPLANT
STRIP CLOSURE SKIN 1/2X4 (GAUZE/BANDAGES/DRESSINGS) ×2 IMPLANT
SUT MNCRL AB 3-0 PS2 18 (SUTURE) ×3 IMPLANT
SUT VIC AB 0 CT1 36 (SUTURE) ×3 IMPLANT
SUT VIC AB 1 CT1 36 (SUTURE) ×6 IMPLANT
SUT VIC AB 2-0 CT1 27 (SUTURE) ×3
SUT VIC AB 2-0 CT1 TAPERPNT 27 (SUTURE) ×1 IMPLANT
SWABSTK COMLB BENZOIN TINCTURE (MISCELLANEOUS) ×3 IMPLANT
SYR 20CC LL (SYRINGE) ×3 IMPLANT
SYR 50ML LL SCALE MARK (SYRINGE) ×3 IMPLANT
TOWEL OR NON WOVEN STRL DISP B (DISPOSABLE) ×3 IMPLANT
TRAY CATH 16FR W/PLASTIC CATH (SET/KITS/TRAYS/PACK) ×2 IMPLANT
TRAY FOLEY BAG SILVER LF 16FR (CATHETERS) ×3 IMPLANT
WATER STERILE IRR 1000ML POUR (IV SOLUTION) ×3 IMPLANT
WRAP KNEE MAXI GEL POST OP (GAUZE/BANDAGES/DRESSINGS) ×2 IMPLANT
YANKAUER SUCT BULB TIP NO VENT (SUCTIONS) ×3 IMPLANT

## 2017-06-07 NOTE — Anesthesia Postprocedure Evaluation (Signed)
Anesthesia Post Note  Patient: CARLISLE ENKE  Procedure(s) Performed: RIGHT TOTAL KNEE ARTHROPLASTY, INJECTION LEFT KNEE WITH FLUID ASPIRATION (Right Knee)     Patient location during evaluation: PACU Anesthesia Type: MAC, Spinal and Regional Level of consciousness: awake and alert Pain management: pain level controlled Vital Signs Assessment: post-procedure vital signs reviewed and stable Respiratory status: spontaneous breathing, nonlabored ventilation, respiratory function stable and patient connected to nasal cannula oxygen Cardiovascular status: stable and blood pressure returned to baseline Postop Assessment: no apparent nausea or vomiting and spinal receding Anesthetic complications: no    Last Vitals:  Vitals:   06/07/17 1619 06/07/17 1726  BP: (!) 150/76 (!) 143/80  Pulse: 69 70  Resp: 15 16  Temp: 36.7 C 36.6 C  SpO2: 100% 100%    Last Pain:  Vitals:   06/07/17 1726  TempSrc: Oral  PainSc:                  Ronna Herskowitz

## 2017-06-07 NOTE — Discharge Instructions (Signed)

## 2017-06-07 NOTE — Progress Notes (Signed)
Assisted Dr. Maple HudsonMoser with right, ultrasound guided, knee, adductor canal block. Side rails up, monitors on throughout procedure. See vital signs in flow sheet. Tolerated Procedure well.

## 2017-06-07 NOTE — Progress Notes (Signed)
[facility]  Patient Status: New  [facility] will be providing the following services: PT  If patient discharges after hours, please call [phone].   Avie EchevariaKaren Nussbaum 06/07/2017, 2:47 PM

## 2017-06-07 NOTE — Anesthesia Procedure Notes (Signed)
Spinal  Patient location during procedure: OR Start time: 06/07/2017 9:48 AM End time: 06/07/2017 9:52 AM Staffing Anesthesiologist: Val EagleMoser, Jovan Colligan, MD Preanesthetic Checklist Completed: patient identified, surgical consent, pre-op evaluation, timeout performed, IV checked, risks and benefits discussed and monitors and equipment checked Spinal Block Patient position: sitting Prep: site prepped and draped and DuraPrep Patient monitoring: heart rate, cardiac monitor, continuous pulse ox and blood pressure Approach: midline Location: L3-4 Injection technique: single-shot Needle Needle type: Pencan  Needle gauge: 24 G Needle length: 10 cm Assessment Sensory level: T6

## 2017-06-07 NOTE — Plan of Care (Signed)
df

## 2017-06-07 NOTE — Evaluation (Signed)
Physical Therapy Evaluation Patient Details Name: Peter SchlatterChester K Roth MRN: [id number] DOB: [birth year] Today's Date: 06/07/2017   History of Present Illness  Pt s/p R TKR and with hx of bipolar  Clinical Impression  Pt s/p R TKR and presents with decreased R LE strength/ROM and post op pain limiting functional mobility.  Pt should progress to dc home with family assist.    Follow Up Recommendations Home health PT    Equipment Recommendations  None recommended by PT    Recommendations for Other Services       Precautions / Restrictions Precautions Precautions: Fall Required Braces or Orthoses: Knee Immobilizer - Right Knee Immobilizer - Right: Discontinue once straight leg raise with < 10 degree lag Restrictions Weight Bearing Restrictions: No Other Position/Activity Restrictions: WBAT      Mobility  Bed Mobility Overal bed mobility: Needs Assistance Bed Mobility: Supine to Sit     Supine to sit: Min assist     General bed mobility comments: cues for sequence adn use of L LE to self assist  Transfers Overall transfer level: Needs assistance Equipment used: Rolling walker (2 wheeled) Transfers: Sit to/from Stand Sit to Stand: Min assist;Mod assist         General transfer comment: cues for LE management and use of UEs to self assist.  Assist to bring wt up and fwd  Ambulation/Gait Ambulation/Gait assistance: Min assist;Min guard Ambulation Distance (Feet): 170 Feet Assistive device: Rolling walker (2 wheeled) Gait Pattern/deviations: Step-to pattern;Step-through pattern;Decreased step length - right;Decreased step length - left;Shuffle;Antalgic;Trunk flexed Gait velocity: decr Gait velocity interpretation: Below normal speed for age/gender General Gait Details: cues for posture, position from RW and initial sequence  Stairs            Wheelchair Mobility    Modified Rankin (Stroke Patients Only)       Balance                                             Pertinent Vitals/Pain Pain Assessment: 0-10 Pain Score: 3  Pain Location: R knee Pain Descriptors / Indicators: Aching;Sore Pain Intervention(s): Limited activity within patient's tolerance;Monitored during session;Premedicated before session;Ice applied    Home Living Family/patient expects to be discharged to:: Private residence Living Arrangements: Spouse/significant other Available Help at Discharge: Family Type of Home: House Home Access: Stairs to enter   Secretary/administratorntrance Stairs-Number of Steps: 1 Home Layout: One level Home Equipment: Environmental consultantWalker - 2 wheels;Bedside commode      Prior Function Level of Independence: Independent               Hand Dominance        Extremity/Trunk Assessment   Upper Extremity Assessment Upper Extremity Assessment: Overall WFL for tasks assessed    Lower Extremity Assessment Lower Extremity Assessment: RLE deficits/detail    Cervical / Trunk Assessment Cervical / Trunk Assessment: Normal  Communication   Communication: No difficulties  Cognition Arousal/Alertness: Awake/alert Behavior During Therapy: WFL for tasks assessed/performed;Impulsive Overall Cognitive Status: Within Functional Limits for tasks assessed                                        General Comments      Exercises Total Joint Exercises Ankle Circles/Pumps: AROM;Both;15 reps;Supine   Assessment/Plan  PT Assessment Patient needs continued PT services  PT Problem List Decreased strength;Decreased range of motion;Decreased activity tolerance;Decreased mobility;Decreased knowledge of use of DME;Pain       PT Treatment Interventions DME instruction;Gait training;Stair training;Functional mobility training;Therapeutic activities;Therapeutic exercise;Patient/family education    PT Goals (Current goals can be found in the Care Plan section)  Acute Rehab PT Goals Patient Stated Goal: Regain IND and walk without pain. PT  Goal Formulation: With patient Time For Goal Achievement: 06/11/17 Potential to Achieve Goals: Good    Frequency 7X/week   Barriers to discharge        Co-evaluation               AM-PAC PT "6 Clicks" Daily Activity  Outcome Measure Difficulty turning over in bed (including adjusting bedclothes, sheets and blankets)?: Unable Difficulty moving from lying on back to sitting on the side of the bed? : Unable Difficulty sitting down on and standing up from a chair with arms (e.g., wheelchair, bedside commode, etc,.)?: Unable Help needed moving to and from a bed to chair (including a wheelchair)?: A Little Help needed walking in hospital room?: A Little Help needed climbing 3-5 steps with a railing? : A Little 6 Click Score: 12    End of Session Equipment Utilized During Treatment: Gait belt;Right knee immobilizer Activity Tolerance: Patient tolerated treatment well Patient left: Other (comment)(bathroom) Nurse Communication: Mobility status PT Visit Diagnosis: Difficulty in walking, not elsewhere classified (R26.2)    Time: [id number] PT Time Calculation (min) (ACUTE ONLY): 18 min   Charges:   PT Evaluation $PT Eval Low Complexity: 1 Low     PT G Codes:        Pg [phone]   Peter Roth 06/07/2017, 6:01 PM

## 2017-06-07 NOTE — Anesthesia Preprocedure Evaluation (Signed)
Anesthesia Evaluation  Patient identified by MRN, date of birth, ID band Patient awake    Reviewed: Allergy & Precautions, NPO status , Patient's Chart, lab work & pertinent test results  History of Anesthesia Complications Negative for: history of anesthetic complications  Airway Mallampati: II  TM Distance: >3 FB Neck ROM: Full    Dental  (+) Teeth Intact, Missing,    Pulmonary neg shortness of breath, neg sleep apnea, neg COPD, neg recent URI, Current Smoker,    breath sounds clear to auscultation       Cardiovascular hypertension,  Rhythm:Regular     Neuro/Psych  Headaches, PSYCHIATRIC DISORDERS Bipolar Disorder    GI/Hepatic negative GI ROS, Neg liver ROS,   Endo/Other  diabetes  Renal/GU negative Renal ROS     Musculoskeletal  (+) Arthritis ,   Abdominal   Peds  Hematology negative hematology ROS (+)   Anesthesia Other Findings   Reproductive/Obstetrics                             Anesthesia Physical Anesthesia Plan  ASA: II  Anesthesia Plan: MAC and Spinal   Post-op Pain Management:    Induction: Intravenous  PONV Risk Score and Plan: 0 and Ondansetron  Airway Management Planned: Nasal Cannula  Additional Equipment:   Intra-op Plan:   Post-operative Plan:   Informed Consent: I have reviewed the patients History and Physical, chart, labs and discussed the procedure including the risks, benefits and alternatives for the proposed anesthesia with the patient or authorized representative who has indicated his/her understanding and acceptance.   Dental advisory given  Plan Discussed with: CRNA and Surgeon  Anesthesia Plan Comments:         Anesthesia Quick Evaluation

## 2017-06-07 NOTE — Anesthesia Procedure Notes (Signed)
Anesthesia Regional Block: Adductor canal block   Pre-Anesthetic Checklist: ,, timeout performed, Correct Patient, Correct Site, Correct Laterality, Correct Procedure, Correct Position, site marked, Risks and benefits discussed,  Surgical consent,  Pre-op evaluation,  At surgeon's request and post-op pain management  Laterality: Lower and Right  Prep: chloraprep       Needles:  Injection technique: Single-shot  Needle Type: Echogenic Stimulator Needle          Additional Needles:   Narrative:  Start time: 06/07/2017 9:10 AM End time: 06/07/2017 9:13 AM Injection made incrementally with aspirations every 5 mL.  Performed by: Personally  Anesthesiologist: Val EagleMoser, Dade Rodin, MD  Additional Notes: H+P and labs reviewed, risks and benefits discussed with patient, procedure tolerated well without complications

## 2017-06-07 NOTE — H&P (Signed)
TOTAL KNEE ADMISSION H&P  Patient is being admitted for right total knee arthroplasty.  Subjective:  Chief Complaint:right knee pain.  HPI: Peter Roth, 56 y.o. male, has a history of pain and functional disability in the right knee due to arthritis and has failed non-surgical conservative treatments for greater than 12 weeks to includeNSAID's and/or analgesics, corticosteriod injections, viscosupplementation injections, use of assistive devices and activity modification.  Onset of symptoms was gradual, starting 5 years ago with gradually worsening course since that time. The patient noted prior procedures on the knee to include  arthroscopy and menisectomy on the right knee(s).  Patient currently rates pain in the right knee(s) at 8 out of 10 with activity. Patient has night pain, worsening of pain with activity and weight bearing, pain that interferes with activities of daily living, pain with passive range of motion, crepitus and joint swelling.  Patient has evidence of subchondral sclerosis, periarticular osteophytes, joint subluxation and joint space narrowing by imaging studies. This patient has had failure of all reasonable conservative care. There is no active infection.  There are no active problems to display for this patient.  Past Medical History:  Diagnosis Date  . Arthritis   . Bipolar 1 disorder (HCC)   . Diabetes mellitus without complication (HCC)    type 2  . Headache    stress related  . High cholesterol   . Hypertension     Past Surgical History:  Procedure Laterality Date  . JOINT REPLACEMENT     Right knee Dr. Luiz Blare 06/07/17  . NO PAST SURGERIES      No current facility-administered medications for this encounter.    Current Outpatient Medications  Medication Sig Dispense Refill Last Dose  . acetaminophen-codeine (TYLENOL #3) 300-30 MG tablet Take 1 tablet every 6 (six) hours as needed by mouth for moderate pain.   05/14/2017 at Unknown time  .  amoxicillin (AMOXIL) 500 MG capsule Take 1 capsule (500 mg total) by mouth 3 (three) times daily. (Patient not taking: Reported on 09/15/2016) 21 capsule 0 Completed Course at Unknown time  . aspirin EC 81 MG tablet Take 81 mg by mouth daily.   09/14/2016 at Unknown time  . atorvastatin (LIPITOR) 40 MG tablet Take 40 mg by mouth daily.  3 05/15/2017 at Unknown time  . cephALEXin (KEFLEX) 500 MG capsule Take 1 capsule (500 mg total) by mouth 4 (four) times daily. (Patient not taking: Reported on 06/05/2015) 40 capsule 0 Not Taking at Unknown time  . cyclobenzaprine (FLEXERIL) 10 MG tablet Take 1 tablet (10 mg total) by mouth 3 (three) times daily as needed for muscle spasms. (Patient not taking: Reported on 09/15/2016) 15 tablet 0 Completed Course at Unknown time  . divalproex (DEPAKOTE ER) 500 MG 24 hr tablet Take 1,000 mg by mouth at bedtime.   05/14/2017 at Unknown time  . HYDROcodone-acetaminophen (NORCO/VICODIN) 5-325 MG tablet Take 1 tablet by mouth every 4 (four) hours as needed. 10 tablet 0   . HYDROcodone-acetaminophen (NORCO/VICODIN) 5-325 MG tablet Take 1-2 tablets every 6 (six) hours as needed by mouth for moderate pain.   05/14/2017 at Unknown time  . ibuprofen (ADVIL,MOTRIN) 600 MG tablet Take 1 tablet (600 mg total) by mouth every 6 (six) hours as needed. 30 tablet 0 05/15/2017 at Unknown time  . lisinopril (PRINIVIL,ZESTRIL) 10 MG tablet Take 10 mg daily by mouth.   05/15/2017 at Unknown time  . meloxicam (MOBIC) 15 MG tablet Take 15 mg daily as needed by mouth  for pain.    Past Week at Unknown time  . methocarbamol (ROBAXIN) 750 MG tablet Take 750 mg 4 (four) times daily by mouth.   05/15/2017 at Unknown time  . nabumetone (RELAFEN) 750 MG tablet Take 1,300 mg daily by mouth.   05/15/2017 at Unknown time  . omeprazole (PRILOSEC) 40 MG capsule Take 40 mg daily by mouth.   05/15/2017 at Unknown time  . sertraline (ZOLOFT) 100 MG tablet Take 1 tablet by mouth daily.   09/14/2016 at Unknown time  .  sitaGLIPtin-metformin (JANUMET) 50-500 MG tablet Take 1 tablet 2 (two) times daily with a meal by mouth.   05/15/2017 at Unknown time  . tizanidine (ZANAFLEX) 2 MG capsule Take 2 mg every 12 (twelve) hours by mouth.   05/15/2017 at Unknown time  . traMADol (ULTRAM) 50 MG tablet Take 50 mg every 6 (six) hours as needed by mouth for moderate pain.   05/15/2017 at Unknown time   Allergies  Allergen Reactions  . Sulfa Antibiotics Anaphylaxis and Other (See Comments)    Mouth sores   . Cephalosporins Swelling    Oral swelling    Social History   Tobacco Use  . Smoking status: Current Every Day Smoker    Packs/day: 0.50    Types: Cigarettes  . Smokeless tobacco: Never Used  Substance Use Topics  . Alcohol use: No    No family history on file.   ROS ROS: I have reviewed the patient's review of systems thoroughly and there are no positive responses as relates to the HPI. Objective:  Physical Exam  Vital signs in last 24 hours:   Well-developed well-nourished patient in no acute distress. Alert and oriented x3 HEENT:within normal limits Cardiac: Regular rate and rhythm Pulmonary: Lungs clear to auscultation Abdomen: Soft and nontender.  Normal active bowel sounds  Musculoskeletal: r knee: painful rom limited rom mild varus allignment Labs: Recent Results (from the past 2160 hour(s))  Surgical pcr screen     Status: None   Collection Time: 05/27/17 10:16 AM  Result Value Ref Range   MRSA, PCR NEGATIVE NEGATIVE   Staphylococcus aureus NEGATIVE NEGATIVE    Comment: (NOTE) The Xpert SA Assay (FDA approved for NASAL specimens in patients 222 years of age and older), is one component of a comprehensive surveillance program. It is not intended to diagnose infection nor to guide or monitor treatment.   Glucose, capillary     Status: Abnormal   Collection Time: 05/27/17 10:35 AM  Result Value Ref Range   Glucose-Capillary 103 (H) 65 - 99 mg/dL  APTT     Status: None   Collection  Time: 05/27/17 12:22 PM  Result Value Ref Range   aPTT 31 24 - 36 seconds  Protime-INR     Status: None   Collection Time: 05/27/17 12:22 PM  Result Value Ref Range   Prothrombin Time 13.1 11.4 - 15.2 seconds   INR 1.00   Type and screen Order type and screen if day of surgery is less than 15 days from draw of preadmission visit or order morning of surgery if day of surgery is greater than 6 days from preadmission visit.     Status: None   Collection Time: 05/27/17 12:22 PM  Result Value Ref Range   ABO/RH(D) A NEG    Antibody Screen NEG    Sample Expiration 06/10/2017    Extend sample reason NO TRANSFUSIONS OR PREGNANCY IN THE PAST 3 MONTHS   ABO/Rh  Status: None   Collection Time: 05/27/17 12:22 PM  Result Value Ref Range   ABO/RH(D) A NEG     Estimated body mass index is 30.45 kg/m as calculated from the following:   Height as of 05/27/17: 5\' 5"  (1.651 m).   Weight as of 05/27/17: 83 kg (183 lb).   Imaging Review Plain radiographs demonstrate severe degenerative joint disease of the right knee(s). The overall alignment ismild varus. The bone quality appears to be fair for age and reported activity level.  Assessment/Plan:  End stage arthritis, right knee   The patient history, physical examination, clinical judgment of the provider and imaging studies are consistent with end stage degenerative joint disease of the right knee(s) and total knee arthroplasty is deemed medically necessary. The treatment options including medical management, injection therapy arthroscopy and arthroplasty were discussed at length. The risks and benefits of total knee arthroplasty were presented and reviewed. The risks due to aseptic loosening, infection, stiffness, patella tracking problems, thromboembolic complications and other imponderables were discussed. The patient acknowledged the explanation, agreed to proceed with the plan and consent was signed. Patient is being admitted for inpatient  treatment for surgery, pain control, PT, OT, prophylactic antibiotics, VTE prophylaxis, progressive ambulation and ADL's and discharge planning. The patient is planning to be discharged home with home health services

## 2017-06-07 NOTE — Transfer of Care (Signed)
Immediate Anesthesia Transfer of Care Note  Patient: Peter SchlatterChester K Roth  Procedure(s) Performed: RIGHT TOTAL KNEE ARTHROPLASTY, INJECTION LEFT KNEE WITH FLUID ASPIRATION (Right Knee)  Patient Location: PACU  Anesthesia Type:Spinal  Level of Consciousness: awake, alert  and oriented  Airway & Oxygen Therapy: Patient Spontanous Breathing and Patient connected to face mask oxygen  Post-op Assessment: Report given to RN  Post vital signs: Reviewed and stable  Last Vitals:  Vitals:   06/07/17 0941 06/07/17 0942  BP:    Pulse: 82 80  Resp: 14 15  Temp:    SpO2: 97% 98%    Last Pain:  Vitals:   06/07/17 0822  TempSrc:   PainSc: 0-No pain      Patients Stated Pain Goal: 4 (06/07/17 [id number])  Complications: No apparent anesthesia complications

## 2017-06-08 LAB — CBC
HCT: 29.1 % — ABNORMAL LOW (ref 39.0–52.0)
Hemoglobin: 9.3 g/dL — ABNORMAL LOW (ref 13.0–17.0)
MCH: 27.1 pg (ref 26.0–34.0)
MCHC: 32 g/dL (ref 30.0–36.0)
MCV: 84.8 fL (ref 78.0–100.0)
PLATELETS: 276 10*3/uL (ref 150–400)
RBC: 3.43 MIL/uL — ABNORMAL LOW (ref 4.22–5.81)
RDW: 13.4 % (ref 11.5–15.5)
WBC: 11.6 10*3/uL — AB (ref 4.0–10.5)

## 2017-06-08 LAB — BASIC METABOLIC PANEL
ANION GAP: 8 (ref 5–15)
BUN: 16 mg/dL (ref 6–20)
CALCIUM: 8.7 mg/dL — AB (ref 8.9–10.3)
CO2: 25 mmol/L (ref 22–32)
CREATININE: 0.76 mg/dL (ref 0.61–1.24)
Chloride: 103 mmol/L (ref 101–111)
GFR calc Af Amer: >60 mL/min (ref >60)
GLUCOSE: 191 mg/dL — AB (ref 65–99)
Potassium: 4.1 mmol/L (ref 3.5–5.1)
Sodium: 136 mmol/L (ref 135–145)

## 2017-06-08 LAB — GLUCOSE, CAPILLARY
GLUCOSE-CAPILLARY: 146 mg/dL — AB (ref 65–99)
Glucose-Capillary: 189 mg/dL — ABNORMAL HIGH (ref 65–99)
Glucose-Capillary: 212 mg/dL — ABNORMAL HIGH (ref 65–99)
Glucose-Capillary: 155 mg/dL — ABNORMAL HIGH (ref 65–99)

## 2017-06-08 NOTE — Progress Notes (Signed)
Physical Therapy Treatment Patient Details Name: Peter Roth MRN: [id number] DOB: Dec 29, 1960 Today's Date: 06/08/2017    History of Present Illness Pt s/p R TKR and with hx of bipolar    PT Comments    Pt with improved pain control this session and progressing well with mobility.   Follow Up Recommendations  Home health PT     Equipment Recommendations  None recommended by PT    Recommendations for Other Services       Precautions / Restrictions Precautions Precautions: Fall Required Braces or Orthoses: Knee Immobilizer - Right Knee Immobilizer - Right: Discontinue once straight leg raise with < 10 degree lag Restrictions Weight Bearing Restrictions: No Other Position/Activity Restrictions: WBAT    Mobility  Bed Mobility                  Transfers Overall transfer level: Needs assistance Equipment used: Rolling walker (2 wheeled) Transfers: Sit to/from Stand Sit to Stand: Min assist         General transfer comment: cues for LE management and use of UEs to self assist.  Assist to bring wt up and fwd  Ambulation/Gait Ambulation/Gait assistance: Min assist;Min guard Ambulation Distance (Feet): 160 Feet Assistive device: Rolling walker (2 wheeled) Gait Pattern/deviations: Step-to pattern;Step-through pattern;Decreased step length - right;Decreased step length - left;Shuffle;Antalgic;Trunk flexed Gait velocity: decr Gait velocity interpretation: Below normal speed for age/gender General Gait Details: cues for posture, position from RW and initial sequence   Stairs            Wheelchair Mobility    Modified Rankin (Stroke Patients Only)       Balance                                            Cognition Arousal/Alertness: Awake/alert Behavior During Therapy: WFL for tasks assessed/performed;Impulsive Overall Cognitive Status: Within Functional Limits for tasks assessed                                        Exercises Total Joint Exercises Ankle Circles/Pumps: AROM;Both;15 reps;Supine Quad Sets: AROM;Both;10 reps;Supine Heel Slides: AAROM;10 reps;Right;Supine Straight Leg Raises: AAROM;Right;10 reps;Supine    General Comments        Pertinent Vitals/Pain Pain Assessment: 0-10 Pain Score: 4  Pain Location: R knee Pain Descriptors / Indicators: Aching;Sore Pain Intervention(s): Limited activity within patient's tolerance;Monitored during session;Premedicated before session;Ice applied    Home Living                      Prior Function            PT Goals (current goals can now be found in the care plan section) Acute Rehab PT Goals Patient Stated Goal: Regain IND and walk without pain. PT Goal Formulation: With patient Time For Goal Achievement: 06/11/17 Potential to Achieve Goals: Good Progress towards PT goals: Progressing toward goals    Frequency    7X/week      PT Plan Current plan remains appropriate    Co-evaluation              AM-PAC PT "6 Clicks" Daily Activity  Outcome Measure  Difficulty turning over in bed (including adjusting bedclothes, sheets and blankets)?: Unable Difficulty moving from lying on back to sitting  on the side of the bed? : Unable Difficulty sitting down on and standing up from a chair with arms (e.g., wheelchair, bedside commode, etc,.)?: Unable Help needed moving to and from a bed to chair (including a wheelchair)?: A Little Help needed walking in hospital room?: A Little Help needed climbing 3-5 steps with a railing? : A Little 6 Click Score: 12    End of Session Equipment Utilized During Treatment: Gait belt;Right knee immobilizer Activity Tolerance: Patient tolerated treatment well Patient left: in chair;with call bell/phone within reach Nurse Communication: Mobility status PT Visit Diagnosis: Difficulty in walking, not elsewhere classified (R26.2)     Time: 1610-96041012-1027 PT Time Calculation (min)  (ACUTE ONLY): 15 min  Charges:  $Gait Training: 8-22 mins $Therapeutic Exercise: 8-22 mins                    G Codes:       Pg [phone]    Peter Roth 06/08/2017, 12:46 PM

## 2017-06-08 NOTE — Progress Notes (Signed)
Physical Therapy Treatment Patient Details Name: Peter SchlatterChester K Roth MRN: [id number] DOB: 10/10/1960 Today's Date: 06/08/2017    History of Present Illness Pt s/p R TKR and with hx of bipolar    PT Comments    Pt performed therex program with assist.  Pt initially declining premed but after realizing pain level requested pain meds prior to attempting ambulation.   Follow Up Recommendations  Home health PT     Equipment Recommendations  None recommended by PT    Recommendations for Other Services       Precautions / Restrictions Precautions Precautions: Fall Required Braces or Orthoses: Knee Immobilizer - Right Knee Immobilizer - Right: Discontinue once straight leg raise with < 10 degree lag Restrictions Weight Bearing Restrictions: No Other Position/Activity Restrictions: WBAT    Mobility  Bed Mobility                  Transfers                    Ambulation/Gait                 Stairs            Wheelchair Mobility    Modified Rankin (Stroke Patients Only)       Balance                                            Cognition Arousal/Alertness: Awake/alert Behavior During Therapy: WFL for tasks assessed/performed;Impulsive Overall Cognitive Status: Within Functional Limits for tasks assessed                                        Exercises Total Joint Exercises Ankle Circles/Pumps: AROM;Both;15 reps;Supine Quad Sets: AROM;Both;10 reps;Supine Heel Slides: AAROM;10 reps;Right;Supine Straight Leg Raises: AAROM;Right;10 reps;Supine    General Comments        Pertinent Vitals/Pain Pain Assessment: 0-10 Pain Score: 8  Pain Location: R knee Pain Descriptors / Indicators: Aching;Sore Pain Intervention(s): Limited activity within patient's tolerance;Monitored during session;Patient requesting pain meds-RN notified;Ice applied    Home Living                      Prior Function             PT Goals (current goals can now be found in the care plan section) Acute Rehab PT Goals Patient Stated Goal: Regain IND and walk without pain. PT Goal Formulation: With patient Time For Goal Achievement: 06/11/17 Potential to Achieve Goals: Good Progress towards PT goals: Progressing toward goals    Frequency    7X/week      PT Plan Current plan remains appropriate    Co-evaluation              AM-PAC PT "6 Clicks" Daily Activity  Outcome Measure  Difficulty turning over in bed (including adjusting bedclothes, sheets and blankets)?: Unable Difficulty moving from lying on back to sitting on the side of the bed? : Unable Difficulty sitting down on and standing up from a chair with arms (e.g., wheelchair, bedside commode, etc,.)?: Unable Help needed moving to and from a bed to chair (including a wheelchair)?: A Little Help needed walking in hospital room?: A Little Help needed climbing 3-5 steps with a railing? :  A Little 6 Click Score: 12    End of Session   Activity Tolerance: Patient limited by pain Patient left: in chair;with call bell/phone within reach Nurse Communication: Mobility status PT Visit Diagnosis: Difficulty in walking, not elsewhere classified (R26.2)     Time: 9147-82950825-0837 PT Time Calculation (min) (ACUTE ONLY): 12 min  Charges:  $Therapeutic Exercise: 8-22 mins                    G Codes:       Pg [phone]    Peter Roth 06/08/2017, 12:42 PM

## 2017-06-08 NOTE — Progress Notes (Signed)
   PATIENT ID: Peter Roth   1 Day Post-Op Procedure(s) (LRB): RIGHT TOTAL KNEE ARTHROPLASTY, INJECTION LEFT KNEE WITH FLUID ASPIRATION (Right)  Subjective: Doing very well, encouraged by PT yesterday. Sitting up in chair eating breakfast. Pain is tolerable and well controlled.  Objective:  Vitals:   06/08/17 0053 06/08/17 0507  BP: (!) 145/84 (!) 152/82  Pulse: 91 86  Resp: 16 14  Temp: 98.3 F (36.8 C) 98.6 F (37 C)  SpO2: 97% 98%     R knee dressing c/d/i Wiggles toes, distally NVI  Labs:  Recent Labs    06/08/17 0545  HGB 9.3*   Recent Labs    06/08/17 0545  WBC 11.6*  RBC 3.43*  HCT 29.1*  PLT 276   Recent Labs    06/08/17 0545  NA 136  K 4.1  CL 103  CO2 25  BUN 16  CREATININE 0.76  GLUCOSE 191*  CALCIUM 8.7*    Assessment and Plan: 1 day s/p right TKA Up with PT today Continue pain mgmt ABLA- expected, asymptomatic, will continue to follow D/c likely tomorrow when cleared by PT and pain controlled, HHPT set up Continue to follow  VTE proph: asa, SCDs

## 2017-06-08 NOTE — Progress Notes (Signed)
Physical Therapy Treatment Patient Details Name: Peter SchlatterChester K Roth MRN: [id number] DOB: 02-06-1961 Today's Date: 06/08/2017    History of Present Illness Pt s/p R TKR and with hx of bipolar    PT Comments    Pt continues cooperative but ltd this pm by increased pain.   Follow Up Recommendations  Home health PT     Equipment Recommendations  None recommended by PT    Recommendations for Other Services       Precautions / Restrictions Precautions Precautions: Fall Required Braces or Orthoses: Knee Immobilizer - Right Knee Immobilizer - Right: Discontinue once straight leg raise with < 10 degree lag Restrictions Weight Bearing Restrictions: No Other Position/Activity Restrictions: WBAT    Mobility  Bed Mobility Overal bed mobility: Needs Assistance Bed Mobility: Sit to Supine       Sit to supine: Min assist   General bed mobility comments: cues for sequence and use of L LE to self assist  Transfers Overall transfer level: Needs assistance Equipment used: Rolling walker (2 wheeled) Transfers: Sit to/from Stand Sit to Stand: Min assist         General transfer comment: cues for LE management and use of UEs to self assist.  Assist to bring wt up and fwd  Ambulation/Gait Ambulation/Gait assistance: Min assist;Min guard Ambulation Distance (Feet): 60 Feet Assistive device: Rolling walker (2 wheeled) Gait Pattern/deviations: Step-to pattern;Step-through pattern;Decreased step length - right;Decreased step length - left;Shuffle;Antalgic;Trunk flexed Gait velocity: decr Gait velocity interpretation: Below normal speed for age/gender General Gait Details: cues for posture, position from RW and sequence   Stairs            Wheelchair Mobility    Modified Rankin (Stroke Patients Only)       Balance                                            Cognition Arousal/Alertness: Awake/alert Behavior During Therapy: WFL for tasks  assessed/performed;Impulsive Overall Cognitive Status: Within Functional Limits for tasks assessed                                        Exercises Total Joint Exercises Ankle Circles/Pumps: AROM;Both;15 reps;Supine Quad Sets: AROM;Both;10 reps;Supine Heel Slides: AAROM;10 reps;Right;Supine Straight Leg Raises: AAROM;Right;10 reps;Supine    General Comments        Pertinent Vitals/Pain Pain Assessment: 0-10 Pain Score: 8  Pain Location: R knee Pain Descriptors / Indicators: Aching;Sore Pain Intervention(s): Limited activity within patient's tolerance;Monitored during session;Premedicated before session;Ice applied;Patient requesting pain meds-RN notified    Home Living                      Prior Function            PT Goals (current goals can now be found in the care plan section) Acute Rehab PT Goals Patient Stated Goal: Regain IND and walk without pain. PT Goal Formulation: With patient Time For Goal Achievement: 06/11/17 Potential to Achieve Goals: Good Progress towards PT goals: Progressing toward goals    Frequency    7X/week      PT Plan Current plan remains appropriate    Co-evaluation              AM-PAC PT "6 Clicks" Daily Activity  Outcome Measure  Difficulty turning over in bed (including adjusting bedclothes, sheets and blankets)?: Unable Difficulty moving from lying on back to sitting on the side of the bed? : Unable Difficulty sitting down on and standing up from a chair with arms (e.g., wheelchair, bedside commode, etc,.)?: Unable Help needed moving to and from a bed to chair (including a wheelchair)?: A Little Help needed walking in hospital room?: A Little Help needed climbing 3-5 steps with a railing? : A Little 6 Click Score: 12    End of Session Equipment Utilized During Treatment: Gait belt;Right knee immobilizer Activity Tolerance: Patient tolerated treatment well;Patient limited by pain Patient left:  in bed;with call bell/phone within reach;with family/visitor present Nurse Communication: Mobility status PT Visit Diagnosis: Difficulty in walking, not elsewhere classified (R26.2)     Time: [id number] PT Time Calculation (min) (ACUTE ONLY): 27 min  Charges:  $Gait Training: 23-37 mins $Therapeutic Exercise: 8-22 mins                    G Codes:       Pg [phone]    Sandar Krinke 06/08/2017, 2:06 PM

## 2017-06-09 LAB — CBC
HCT: 29 % — ABNORMAL LOW (ref 39.0–52.0)
HEMOGLOBIN: 9.4 g/dL — AB (ref 13.0–17.0)
MCH: 27.6 pg (ref 26.0–34.0)
MCHC: 32.4 g/dL (ref 30.0–36.0)
MCV: 85.3 fL (ref 78.0–100.0)
PLATELETS: 278 10*3/uL (ref 150–400)
RBC: 3.4 MIL/uL — ABNORMAL LOW (ref 4.22–5.81)
RDW: 13.8 % (ref 11.5–15.5)
WBC: 13.5 10*3/uL — ABNORMAL HIGH (ref 4.0–10.5)

## 2017-06-09 LAB — GLUCOSE, CAPILLARY: GLUCOSE-CAPILLARY: 110 mg/dL — AB (ref 65–99)

## 2017-06-09 NOTE — Progress Notes (Signed)
Physical Therapy Treatment Patient Details Name: Peter SchlatterChester K Lutz MRN: [id number] DOB: 1961/03/05 Today's Date: 06/09/2017    History of Present Illness Pt s/p R TKR and with hx of bipolar    PT Comments    Pt is continuing to progress with mobility. Reviewed exercises, gait training, and stair training. Issued HEP for pt to perform once more today. Pt expressed frustration with CPM machine. He stated he doesn't think he will use it at home. Instructed pt to call surgeon's office and discuss the matter with MD if he decides to skip use of CPM at home. He is progressing slowly with knee flexion at this time. All education completed. Okay to d/c home from PT standpoint-made RN aware.     Follow Up Recommendations  Home health PT     Equipment Recommendations  None recommended by PT    Recommendations for Other Services       Precautions / Restrictions Precautions Precautions: Fall Required Braces or Orthoses: Knee Immobilizer - Right Knee Immobilizer - Right: Discontinue once straight leg raise with < 10 degree lag Restrictions Weight Bearing Restrictions: No Other Position/Activity Restrictions: WBAT    Mobility  Bed Mobility               General bed mobility comments: oob in recliner  Transfers Overall transfer level: Needs assistance Equipment used: Rolling walker (2 wheeled) Transfers: Sit to/from Stand Sit to Stand: Min guard         General transfer comment: close guard for safety. VCS safety, technique, hand/LE placement  Ambulation/Gait Ambulation/Gait assistance: Min guard Ambulation Distance (Feet): 150 Feet Assistive device: Rolling walker (2 wheeled) Gait Pattern/deviations: Step-to pattern;Step-through pattern;Decreased stride length;Decreased step length - right     General Gait Details: close guard for safety.    Stairs Stairs: Yes Min guard Stair Management: Step to pattern;Forwards;With walker Number of Stairs: 1 General stair  comments: close guard for safety. VCs safety, technique, sequence.  Wheelchair Mobility    Modified Rankin (Stroke Patients Only)       Balance                                            Cognition Arousal/Alertness: Awake/alert Behavior During Therapy: WFL for tasks assessed/performed Overall Cognitive Status: Within Functional Limits for tasks assessed                                        Exercises Total Joint Exercises Ankle Circles/Pumps: AROM;Both;Supine;10 reps Quad Sets: AROM;Both;10 reps;Supine Heel Slides: AAROM;10 reps;Right;Supine Hip ABduction/ADduction: AAROM;Right;10 reps;Supine Straight Leg Raises: AAROM;Right;10 reps;Supine Knee Flexion: AAROM;Right;5 reps;Seated Goniometric ROM: ~10-55 degrees    General Comments        Pertinent Vitals/Pain Pain Assessment: 0-10 Pain Score: 6  Pain Location: R thigh with mobility/exercises Pain Descriptors / Indicators: Aching;Sore Pain Intervention(s): Monitored during session;Repositioned;Ice applied    Home Living                      Prior Function            PT Goals (current goals can now be found in the care plan section) Progress towards PT goals: Progressing toward goals    Frequency    7X/week      PT Plan Current  plan remains appropriate    Co-evaluation              AM-PAC PT "6 Clicks" Daily Activity  Outcome Measure  Difficulty turning over in bed (including adjusting bedclothes, sheets and blankets)?: Unable Difficulty moving from lying on back to sitting on the side of the bed? : Unable Difficulty sitting down on and standing up from a chair with arms (e.g., wheelchair, bedside commode, etc,.)?: A Little Help needed moving to and from a bed to chair (including a wheelchair)?: A Little Help needed walking in hospital room?: A Little Help needed climbing 3-5 steps with a railing? : A Little 6 Click Score: 14    End of Session  Equipment Utilized During Treatment: Gait belt;Right knee immobilizer Activity Tolerance: Patient tolerated treatment well Patient left: in chair;with call bell/phone within reach   PT Visit Diagnosis: Difficulty in walking, not elsewhere classified (R26.2);Muscle weakness (generalized) (M62.81)     Time: [id number] PT Time Calculation (min) (ACUTE ONLY): 31 min  Charges:  $Gait Training: 8-22 mins $Therapeutic Exercise: 8-22 mins                    G Codes:          Rebeca AlertJannie Rashun Grattan, MPT Pager: [phone]

## 2017-06-09 NOTE — Progress Notes (Signed)
Pt discharged to home. DC instructions given with significant other at bedside.  No concerns voiced. Prescriptions x 4 meds given. Pt left unit in wheelchair pushed by nurse tech. Left in stable condition. Maree ErieVera Mckinzi Eriksen. Rn.

## 2017-06-09 NOTE — Progress Notes (Signed)
   PATIENT ID: Peter Roth   2 Days Post-Op Procedure(s) (LRB): RIGHT TOTAL KNEE ARTHROPLASTY, INJECTION LEFT KNEE WITH FLUID ASPIRATION (Right)  Subjective: Doing well this morning. Had increased pain yesterday after using CPM machine that he reports limited his progress with PT. Hopes to go home today.   Objective:  Vitals:   06/08/17 2021 06/09/17 0532  BP: (!) 182/83 (!) 154/71  Pulse: 79 86  Resp: 16 15  Temp: 98.9 F (37.2 C) 98.4 F (36.9 C)  SpO2: 100% 93%     R knee dressing w scant blood, intact Wiggles toes, distally NVI Calves soft, nontender  Labs:  Recent Labs    06/08/17 0545 06/09/17 0538  HGB 9.3* 9.4*   Recent Labs    06/08/17 0545 06/09/17 0538  WBC 11.6* 13.5*  RBC 3.43* 3.40*  HCT 29.1* 29.0*  PLT 276 278   Recent Labs    06/08/17 0545  NA 136  K 4.1  CL 103  CO2 25  BUN 16  CREATININE 0.76  GLUCOSE 191*  CALCIUM 8.7*    Assessment and Plan: 2 day s/p right TKA Up with PT today Continue pain mgmt ABLA- expected, asymptomatic, will continue to follow D/c likely today when cleared by PT and pain controlled, HHPT set up Scripts in chart, fu with Dr. Luiz BlareGraves in 2 weeks  VTE proph: asa, SCDs

## 2017-06-09 NOTE — Care Management Note (Signed)
Case Management Note  Patient Details  Name: Garth SchlatterChester K Karges MRN: [id number] Date of Birth: [birth year]  Subjective/Objective:   Right TKA                 Action/Plan: Discharge Planning: NCM spoke to pt and girlfriend at bedside commode. Pt states has RW and bedside commode at home. Offered choice for HH/list provided. [facility] Contacted [facility] for shower chair with back for home. Pt will speak to his PCP about [place] [employer] [facility] for aide to assist him at home.   [facility]  Expected Discharge Date:  06/09/17               Expected Discharge Plan:  Home w Home Health Services  In-House Referral:  NA  Discharge planning Services  CM Consult  Post Acute Care Choice:  Home Health Choice offered to:  Patient  DME Arranged:  Shower stool DME Agency:  [facility]  HH Arr[facility]:  [facility]  [facility]  Status of Service:  Completed, signed off  If discussed at Long Length of Stay Meetings, dates discussed:    Additional Comments:  Elliot CousinShavis, Lianna Sitzmann Ellen, RN 06/09/2017, 10:54 AM

## 2017-06-09 NOTE — Discharge Summary (Signed)
Patient ID: Peter Roth MRN: [id number] DOB/AGE: [birth year] 56 y.o.  Admit date: 06/07/2017 Discharge date: 06/09/2017  Admission Diagnoses:  Principal Problem:   Primary osteoarthritis of right knee Active Problems:   Primary osteoarthritis of left knee   Effusion, left knee   Discharge Diagnoses:  Same  Past Medical History:  Diagnosis Date  . Arthritis   . Bipolar 1 disorder (HCC)   . Diabetes mellitus without complication (HCC)    type 2  . Headache    stress related  . High cholesterol   . Hypertension     Surgeries: Procedure(s): RIGHT TOTAL KNEE ARTHROPLASTY, INJECTION LEFT KNEE WITH FLUID ASPIRATION on 06/07/2017   Consultants:   Discharged Condition: Improved  Hospital Course: Peter Roth is an 56 y.o. male who was admitted 06/07/2017 for operative treatment ofPrimary osteoarthritis of right knee. Patient has severe unremitting pain that affects sleep, daily activities, and work/hobbies. After pre-op clearance the patient was taken to the operating room on 06/07/2017 and underwent  Procedure(s): RIGHT TOTAL KNEE ARTHROPLASTY, INJECTION LEFT KNEE WITH FLUID ASPIRATION.    Patient was given perioperative antibiotics:  Anti-infectives (From admission, onward)   Start     Dose/Rate Route Frequency Ordered Stop   06/07/17 1600  clindamycin (CLEOCIN) IVPB 600 mg     600 mg 100 mL/hr over 30 Minutes Intravenous Every 6 hours 06/07/17 1327 06/08/17 0041   06/07/17 0754  clindamycin (CLEOCIN) IVPB 900 mg     900 mg 100 mL/hr over 30 Minutes Intravenous On call to O.R. 06/07/17 0754 06/07/17 1014       Patient was given sequential compression devices, early ambulation, and asa to prevent DVT.  Patient benefited maximally from hospital stay and there were no complications.    Recent vital signs:  Patient Vitals for the past 24 hrs:  BP Temp Temp src Pulse Resp SpO2  06/09/17 0532 (!) 154/71 98.4 F (36.9 C) Oral 86 15 93 %  06/08/17 2021 (!)  182/83 98.9 F (37.2 C) Oral 79 16 100 %  06/08/17 1445 (!) 169/86 98.7 F (37.1 C) - 89 16 97 %  06/08/17 1005 (!) 176/81 - - 100 16 100 %     Recent laboratory studies:  Recent Labs    06/08/17 0545 06/09/17 0538  WBC 11.6* 13.5*  HGB 9.3* 9.4*  HCT 29.1* 29.0*  PLT 276 278  NA 136  --   K 4.1  --   CL 103  --   CO2 25  --   BUN 16  --   CREATININE 0.76  --   GLUCOSE 191*  --   CALCIUM 8.7*  --      Discharge Medications:   Allergies as of 06/09/2017      Reactions   Sulfa Antibiotics Anaphylaxis, Other (See Comments)   Mouth sores    Cephalosporins Swelling   Oral swelling      Medication List    STOP taking these medications   acetaminophen-codeine 300-30 MG tablet Commonly known as:  TYLENOL #3   amoxicillin 500 MG capsule Commonly known as:  AMOXIL   cephALEXin 500 MG capsule Commonly known as:  KEFLEX   cyclobenzaprine 10 MG tablet Commonly known as:  FLEXERIL   HYDROcodone-acetaminophen 5-325 MG tablet Commonly known as:  NORCO/VICODIN   ibuprofen 600 MG tablet Commonly known as:  ADVIL,MOTRIN   meloxicam 15 MG tablet Commonly known as:  MOBIC   methocarbamol 750 MG tablet Commonly known as:  ROBAXIN  nabumetone 750 MG tablet Commonly known as:  RELAFEN   sertraline 100 MG tablet Commonly known as:  ZOLOFT   tizanidine 2 MG capsule Commonly known as:  ZANAFLEX Replaced by:  tiZANidine 2 MG tablet     TAKE these medications   aspirin EC 325 MG tablet Take 1 tablet (325 mg total) by mouth 2 (two) times daily after a meal. Take x 1 month post op to decrease risk of blood clots. What changed:    medication strength  how much to take  when to take this  additional instructions   atorvastatin 40 MG tablet Commonly known as:  LIPITOR Take 40 mg by mouth daily.   divalproex 500 MG 24 hr tablet Commonly known as:  DEPAKOTE ER Take 1,000 mg by mouth at bedtime.   docusate sodium 100 MG capsule Commonly known as:   COLACE Take 1 capsule (100 mg total) by mouth 2 (two) times daily.   lisinopril 10 MG tablet Commonly known as:  PRINIVIL,ZESTRIL Take 10 mg daily by mouth.   omeprazole 40 MG capsule Commonly known as:  PRILOSEC Take 40 mg daily by mouth.   oxyCODONE-acetaminophen 5-325 MG tablet Commonly known as:  PERCOCET/ROXICET Take 1-2 tablets by mouth every 4 (four) hours as needed.   sitaGLIPtin-metformin 50-500 MG tablet Commonly known as:  JANUMET Take 1 tablet 2 (two) times daily with a meal by mouth.   tiZANidine 2 MG tablet Commonly known as:  ZANAFLEX Take 1 tablet (2 mg total) by mouth every 8 (eight) hours as needed for muscle spasms. Replaces:  tizanidine 2 MG capsule   traMADol 50 MG tablet Commonly known as:  ULTRAM Take 50 mg every 6 (six) hours as needed by mouth for moderate pain.       Diagnostic Studies: Dg Chest 2 View  Result Date: 05/27/2017 CLINICAL DATA:  Pre-op respiratory exam for knee arthroplasty. Hypertension. EXAM: CHEST  2 VIEW COMPARISON:  11/21/2015 FINDINGS: The heart size and mediastinal contours are within normal limits. Both lungs are clear. The visualized skeletal structures are unremarkable. IMPRESSION: Stable exam.  No active cardiopulmonary disease. Electronically Signed   By: Myles RosenthalJohn  Stahl M.D.   On: 05/27/2017 17:58   Dg Knee Complete 4 Views Right  Result Date: 05/15/2017 CLINICAL DATA:  Several month history of right knee pain. The patient has undergone previous arthroscopy and arthrocentesis. Persistent instability and false. EXAM: RIGHT KNEE - COMPLETE 4+ VIEW COMPARISON:  Right knee series dated Nov 23, 2015 FINDINGS: The bones are subjectively adequately mineralized. There is moderate narrowing of the medial joint space with milder narrowing laterally and of the patellofemoral joint. There is beaking of the tibial spines. Spurs arise from the articular margins of the patella. There is no acute fracture or dislocation. There is a small  suprapatellar effusion. IMPRESSION: Moderate osteoarthritic change centered on the medial compartment with milder changes laterally and of the patellofemoral compartment. There is no acute fracture. There is a small suprapatellar effusion. Electronically Signed   By: David  SwazilandJordan M.D.   On: 05/15/2017 08:51    Disposition: 01-Home or Self Care  Discharge Instructions    Call MD / Call 911   Complete by:  As directed    If you experience chest pain or shortness of breath, CALL 911 and be transported to the hospital emergency room.  If you develope a fever above 101 F, pus (white drainage) or increased drainage or redness at the wound, or calf pain, call your surgeon's office.  Constipation Prevention   Complete by:  As directed    Drink plenty of fluids.  Prune juice may be helpful.  You may use a stool softener, such as Colace (over the counter) 100 mg twice a day.  Use MiraLax (over the counter) for constipation as needed.   Diet - low sodium heart healthy   Complete by:  As directed    Increase activity slowly as tolerated   Complete by:  As directed       Follow-up Information    Jodi GeraldsGraves, John, MD. Schedule an appointment as soon as possible for a visit in 2 week(s).   Specialty:  Orthopedic Surgery Contact information: [address] [place] [address] [phone]        [facility] Follow up.   Specialty:  Home Health Services Why:  Home Health Physical Therapy- agency will call to arrange initial visit Contact information: [address] [place] [address] [phone]            Signed: Jiles HaroldLALIBERTE, Jaylenn Baiza 06/09/2017, 9:02 AM

## 2017-06-10 ENCOUNTER — Encounter (HOSPITAL_COMMUNITY): Payer: Self-pay | Admitting: Orthopedic Surgery

## 2017-06-10 NOTE — Brief Op Note (Signed)
06/07/2017  3:45 PM  PATIENT:  Peter Roth  [age] y.o. male  PRE-OPERATIVE DIAGNOSIS:  OSTEOARTHRITIS RIGHT KNEE  POST-OPERATIVE DIAGNOSIS:  OSTEOARTHRITIS RIGHT KNEE  PROCEDURE:  Procedure(s): RIGHT TOTAL KNEE ARTHROPLASTY, INJECTION LEFT KNEE WITH FLUID ASPIRATION (Right)  SURGEON:  Surgeon(s) and Role:    Jodi Geralds* Clark Cuff, MD - Primary  PHYSICIAN ASSISTANT:   ASSISTANTS: bethune   ANESTHESIA:   spinal  EBL:  50 mL   BLOOD ADMINISTERED:none  DRAINS: none   LOCAL MEDICATIONS USED:  MARCAINE    and OTHER experel  SPECIMEN:  No Specimen  DISPOSITION OF SPECIMEN:  PATHOLOGY  COUNTS:  YES  TOURNIQUET:  @60  mio  DICTATION: .Other Dictation: Dictation Number [id number]  PLAN OF CARE: Admit to inpatient   PATIENT DISPOSITION:  PACU - hemodynamically stable.   Delay start of Pharmacological VTE agent (>24hrs) due to surgical blood loss or risk of bleeding: no

## 2017-06-11 NOTE — Op Note (Signed)
NAME:  Peter Roth, Peter Roth                  ACCOUNT NO.:  MEDICAL RECORD NO.:  [id number]  LOCATION:                                 FACILITY:  PHYSICIAN:  Harvie JuniorJohn L. Caterra Ostroff, M.D.        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  PROCEDURE:  Right total knee replacement with Attune system size 6 femur, size 6 tibia, 6-mm bridging bearing, and a 38-mm all polyethylene patella.  SURGEON:  Harvie JuniorJohn L. Belicia Difatta, M.D.  ASSISTANT:  Marshia LyJames Bethune, P.A.  ANESTHESIA:  General.  BRIEF HISTORY:  Peter Roth is a 56 year old male with a long history of significant complaints of bilateral knee pain, right was greater than left.  He had x-ray showing bone-on-bone change.  He was having night pain and light activity pain.  After failure of all conservative care, he was taken to the operating room for right total knee replacement.  DESCRIPTION OF PROCEDURE:  The patient was taken to the operating room and after adequate anesthesia was obtained with a __________ anesthetic, the patient was placed supine on the operating table.  The right leg was prepped and draped in usual sterile fashion.  Following this, the leg was exsanguinated.  Blood pressure tourniquet was inflated to 300 mmHg. Following this, a midline incision was made and the subcutaneous tissue was dissected down to the level of extensor mechanism, and a medial parapatellar arthrotomy was undertaken.  Following this, medial and lateral meniscus were removed, retropatellar fat pad, synovium on the anterior aspect of the femur, and the medial and lateral meniscus. Following this, attention was turned to the femur where an intramedullary pilot hole was drilled followed by a  4-degree valgus and inclination cut, 9 mm of distal bone resected.  Attention was then turned towards sizing the femur, sized to a 6; anterior and posterior cuts were made, chamfers and box.  Attention was then turned to the tibia.  It was cut perpendicular to its long axis.  It was then sized, drilled and keeled with a 6, and attention was then turned towards placing a spacer, a 6 spacer was placed, excellent range of motion and stability were achieved.  Attention was turned to the patella, it was sized to the level of 38, it was cut down initially to a level of 13 mm and then the paddle was placed and lugs were drilled.  Following that, the knee was put through a range of motion, excellent stability and range of motion were achieved; and following this, all trial components were removed and the final components were then cemented into place after the knee was copiously irrigated with pulsatile lavage irrigation and suctioned dry.  The bone surfaces were completely dried and the knee was cemented into place.  The final components were cemented into the place, size 6 tibia, size 6 femur, 6-mm bridging bearing trial was placed and a 38 all poly patella was placed and held with a  clamp.  All the excess bone cement was removed and cement was allowed to completely harden.  When completely hardened, the tourniquet was let down, all bleeders were controlled with electrocautery.  Exparel was instilled throughout the synovial reflections for postoperative pain control. Attention was then turned towards the placement of the final size 6 poly.  Once this was placed, range of motion and stability were checked and the medial parapatellar arthrotomy was closed with 1 Vicryl running, skin with 0 and 2 Vicryl and 3-0 Monocryl subcuticular.  Benzoin and Steri-Strips were applied.  Sterile compressive dressing was applied, and the patient was taken to the recovery room, and was noted to be in satisfactory  condition.  Estimated blood loss for the procedure was minimal.     Harvie JuniorJohn L. Adien Kimmel, M.D.     [name]  D:  06/10/2017  T:  06/10/2017  Job:  [id number]

## 2017-06-12 LAB — AEROBIC/ANAEROBIC CULTURE (SURGICAL/DEEP WOUND): GRAM STAIN: NONE SEEN

## 2017-06-12 LAB — AEROBIC/ANAEROBIC CULTURE W GRAM STAIN (SURGICAL/DEEP WOUND): Culture: NO GROWTH

## 2017-06-25 ENCOUNTER — Encounter: Payer: Medicare Other | Admitting: Physical Therapy

## 2017-06-25 ENCOUNTER — Ambulatory Visit: Payer: Medicare Other | Attending: Orthopedic Surgery | Admitting: Physical Therapy

## 2017-06-25 ENCOUNTER — Encounter: Payer: Self-pay | Admitting: Physical Therapy

## 2017-06-25 DIAGNOSIS — R262 Difficulty in walking, not elsewhere classified: Secondary | ICD-10-CM | POA: Insufficient documentation

## 2017-06-25 DIAGNOSIS — R600 Localized edema: Secondary | ICD-10-CM

## 2017-06-25 DIAGNOSIS — M25561 Pain in right knee: Secondary | ICD-10-CM | POA: Diagnosis present

## 2017-06-25 DIAGNOSIS — M25661 Stiffness of right knee, not elsewhere classified: Secondary | ICD-10-CM | POA: Diagnosis present

## 2017-06-25 NOTE — Therapy (Signed)
[facility] [address] [place], [place], [address] Phone: [phone]   Fax:  [phone]  Physical Therapy Evaluation  Patient Details  Name: Peter Roth MRN: [id number] Date of Birth: 1961/06/08 Referring Provider: Luiz Blare   Encounter Date: 06/25/2017  PT End of Session - 06/25/17 1333    Visit Number  1    Date for PT Re-Evaluation  08/26/17    PT Start Time  1313    PT Stop Time  1410    PT Time Calculation (min)  57 min    Activity Tolerance  Patient tolerated treatment well    Behavior During Therapy  [facility] for tasks assessed/performed       Past Medical History:  Diagnosis Date  . Arthritis   . Bipolar 1 disorder (HCC)   . Diabetes mellitus without complication (HCC)    type 2  . Headache    stress related  . High cholesterol   . Hypertension     Past Surgical History:  Procedure Laterality Date  . JOINT REPLACEMENT     Right knee Dr. Luiz Blare 06/07/17  . NO PAST SURGERIES    . TOTAL KNEE ARTHROPLASTY Right 06/07/2017   Procedure: RIGHT TOTAL KNEE ARTHROPLASTY, INJECTION LEFT KNEE WITH FLUID ASPIRATION;  Surgeon: Jodi Geralds, MD;  Location: [facility];  Service: Orthopedics;  Laterality: Right;    There were no vitals filed for this visit.   Subjective Assessment - 06/25/17 1313    Subjective  Patient underwent a right TKR on 06/07/17. He had HHPT for the past two weeks.  He reports that he is doing pretty good, "can't walk well.",  C/O stiffness    Limitations  Walking    Patient Stated Goals  have less pain, walk better    Currently in Pain?  Yes    Pain Score  3     Pain Location  Knee    Pain Orientation  Right    Pain Descriptors / Indicators  Aching;Sore;Tightness    Pain Type  Acute pain;Surgical pain    Pain Onset  1 to 4 weeks ago    Pain Frequency  Constant    Aggravating Factors   sitting, bending pain can be up to 6-7/10    Pain Relieving Factors  getting up and moving pain  can be down to 2-3/10    Effect of Pain on Daily Activities  difficulty walking         [facility] PT Assessment - 06/25/17 0001      Assessment   Medical Diagnosis  right TKR    Referring Provider  Graves    Onset Date/Surgical Date  06/07/17    Prior Therapy  home PT      Precautions   Precautions  None      Balance Screen   Has the patient fallen in the past 6 months  No    Has the patient had a decrease in activity level because of a fear of falling?   No    Is the patient reluctant to leave their home because of a fear of falling?   No      Home Environment   Additional Comments  no stairs, did housework, did yardwork      Prior Function   Level of Independence  Independent    Vocation  On disability    Leisure  no exercise      Observation/Other Assessments-Edema    Edema  Circumferential      Circumferential Edema   Circumferential - Right  46 cm mid patella    Circumferential - Left   43 cm      ROM / Strength   AROM / PROM / Strength  AROM;PROM;Strength      AROM   AROM Assessment Site  Knee    Right/Left Knee  Right    Right Knee Extension  20    Right Knee Flexion  88      PROM   PROM Assessment Site  Knee    Right/Left Knee  Right    Right Knee Extension  15    Right Knee Flexion  95      Strength   Overall Strength Comments  4-/5 extension with pain, 4/5 for flexion      Palpation   Palpation comment  mild warmth, well healed scar, some puckering proximal scar      Ambulation/Gait   Gait Comments  no device, very stiff right knee, antalgic on the right, lacking extension             Objective measurements completed on examination: See above findings.      [facility] Adult PT Treatment/Exercise - 06/25/17 0001      Exercises   Exercises  Knee/Hip      Knee/Hip Exercises: Aerobic   Nustep  level 4 x 6 minutes      Modalities   Modalities  Vasopneumatic      Vasopneumatic   Number Minutes Vasopneumatic   15 minutes    Vasopnuematic  Location   Knee    Vasopneumatic Pressure  Medium    Vasopneumatic Temperature   15             PT Education - 06/25/17 1333    Education provided  Yes    Education Details  [facility]    Person(s) Educated  Patient    Methods  Explanation    Comprehension  Verbalized understanding       PT Short Term Goals - 06/25/17 1341      PT SHORT TERM GOAL #1   Title  assure independent with HEP    Time  2    Period  Weeks    Status  New        PT Long Term Goals - 06/25/17 1341      PT LONG TERM GOAL #1   Title  increase AROM to 5-114 degrees flexion    Time  8    Period  Weeks    Status  New      PT LONG TERM GOAL #2   Title  walk without deviation all community distances    Time  8    Period  Weeks    Status  New      PT LONG TERM GOAL #3   Title  decrease pain 50%    Time  8    Period  Weeks    Status  New      PT LONG TERM GOAL #4   Title  go up and down stairs step over step    Time  8    Period  Weeks    Status  New             Plan - 06/25/17 1337    Clinical Impression Statement  Patient underwent a right TKR on 06/07/17.  He reports that he has been doing weel  with home PT.  His AROM is 23-87 degrees flexion, PROM 18-91 degrees flexion.  When he walks he does not extend the knee    Clinical Presentation  Stable    Clinical Decision Making  Low    Rehab Potential  Good    PT Frequency  3x / week    PT Duration  8 weeks    PT Treatment/Interventions  ADLs/Self Care Home Management;Cryotherapy;Electrical Stimulation;Gait training;Neuromuscular re-education;Balance training;Therapeutic exercise;Therapeutic activities;Functional mobility training;Stair training;Patient/family education;Manual techniques;Vasopneumatic Device    PT Next Visit Plan  Slowly add exercises in gym, work on his ROM and gait    Consulted and Agree with Plan of Care  Patient       Patient will benefit from skilled therapeutic intervention  in order to improve the following deficits and impairments:  Abnormal gait, Decreased activity tolerance, Decreased balance, Decreased mobility, Decreased strength, Increased edema, Impaired flexibility, Pain, Cardiopulmonary status limiting activity, Decreased endurance, Decreased range of motion, Difficulty walking  Visit Diagnosis: Acute pain of right knee - Plan: PT plan of care cert/re-cert  Stiffness of right knee, not elsewhere classified - Plan: PT plan of care cert/re-cert  Localized edema - Plan: PT plan of care cert/re-cert  Difficulty in walking, not elsewhere classified - Plan: PT plan of care cert/re-cert  G-Codes - 06/25/17 1343    Functional Assessment Tool Used (Outpatient Only)  70% limitation    Functional Limitation  Mobility: Walking and moving around    Mobility: Walking and Moving Around Current Status ([id number])  At least 60 percent but less than 80 percent impaired, limited or restricted    Mobility: Walking and Moving Around Goal Status [phone]([id number])  At least 40 percent but less than 60 percent impaired, limited or restricted        Problem List Patient Active Problem List   Diagnosis Date Noted  . Primary osteoarthritis of right knee 06/07/2017  . Primary osteoarthritis of left knee 06/07/2017  . Effusion, left knee 06/07/2017    Jearld LeschALBRIGHT,Sander Remedios W., PT 06/25/2017, 1:48 PM  [facility] [address] [place], [place], [address] Phone: [phone]   Fax:  [phone]  Name: Garth SchlatterChester K Langhans MRN: [id number] Date of Birth: 1960-12-07

## 2017-06-27 ENCOUNTER — Ambulatory Visit: Payer: Medicare Other | Admitting: Physical Therapy

## 2017-06-27 DIAGNOSIS — R600 Localized edema: Secondary | ICD-10-CM

## 2017-06-27 DIAGNOSIS — M25561 Pain in right knee: Secondary | ICD-10-CM

## 2017-06-27 DIAGNOSIS — M25661 Stiffness of right knee, not elsewhere classified: Secondary | ICD-10-CM

## 2017-06-27 DIAGNOSIS — R262 Difficulty in walking, not elsewhere classified: Secondary | ICD-10-CM

## 2017-06-27 NOTE — Therapy (Signed)
[facility] [address] [place], [place], [address] Phone: [phone]   Fax:  [phone]  Physical Therapy Treatment  Patient Details  Name: Peter SchlatterChester K Roth MRN: [id number] Date of Birth: [birth year] Referring Provider: Luiz BlareGraves   Encounter Date: 06/27/2017  PT End of Session - 06/27/17 1512    Visit Number  2    Date for PT Re-Evaluation  08/26/17    PT Start Time  1445    PT Stop Time  1540    PT Time Calculation (min)  55 min       Past Medical History:  Diagnosis Date  . Arthritis   . Bipolar 1 disorder (HCC)   . Diabetes mellitus without complication (HCC)    type 2  . Headache    stress related  . High cholesterol   . Hypertension     Past Surgical History:  Procedure Laterality Date  . JOINT REPLACEMENT     Right knee Dr. Luiz BlareGraves 06/07/17  . NO PAST SURGERIES    . TOTAL KNEE ARTHROPLASTY Right 06/07/2017   Procedure: RIGHT TOTAL KNEE ARTHROPLASTY, INJECTION LEFT KNEE WITH FLUID ASPIRATION;  Surgeon: Jodi GeraldsGraves, John, MD;  Location[facility];  Service: Orthopedics;  Laterality: Right;    There were no vitals filed for this visit.  Subjective Assessment - 06/27/17 1444    Subjective  saw MD thsi morning and he was pleased. pt amb in without AD with slight antalgic gait and good flexion    Currently in Pain?  Yes    Pain Score  4     Pain Location  Knee    Pain Orientation  Right                      [facility] Adult PT Treatment/Exercise - 06/27/17 0001      Knee/Hip Exercises: Aerobic   Recumbent Bike  6 min    Nustep  L 5 7 min      Knee/Hip Exercises: Machines for Strengthening   Cybex Knee Extension  5# ecc 2 sets 10 cuing for full ext    Cybex Knee Flexion  15# RT only 2 sets 10    Cybex Leg Press  30# 2 sets 10 calf raises 30# 2 sets10      Knee/Hip Exercises: Standing   Heel Raises  Both;20 reps black bar      Vasopneumatic   Number Minutes Vasopneumatic   15 minutes     Vasopnuematic Location   Knee    Vasopneumatic Pressure  Medium    Vasopneumatic Temperature   15      Manual Therapy   Manual Therapy  Joint mobilization;Passive ROM    Passive ROM  flex/ext               PT Short Term Goals - 06/27/17 1513      PT SHORT TERM GOAL #1   Title  assure independent with HEP    Baseline  HHPT ther ex    Status  Achieved        PT Long Term Goals - 06/25/17 1341      PT LONG TERM GOAL #1   Title  increase AROM to 5-114 degrees flexion    Time  8    Period  Weeks    Status  New      PT LONG TERM GOAL #2   Title  walk without deviation all community distances  Time  8    Period  Weeks    Status  New      PT LONG TERM GOAL #3   Title  decrease pain 50%    Time  8    Period  Weeks    Status  New      PT LONG TERM GOAL #4   Title  go up and down stairs step over step    Time  8    Period  Weeks    Status  New            Plan - 06/27/17 1512    Clinical Impression Statement  cuing needed for increased ROM with les compensation with all ther ex    PT Treatment/Interventions  ADLs/Self Care Home Management;Cryotherapy;Electrical Stimulation;Gait training;Neuromuscular re-education;Balance training;Therapeutic exercise;Therapeutic activities;Functional mobility training;Stair training;Patient/family education;Manual techniques;Vasopneumatic Device    PT Next Visit Plan  ROM and strength       Patient will benefit from skilled therapeutic intervention in order to improve the following deficits and impairments:  Abnormal gait, Decreased activity tolerance, Decreased balance, Decreased mobility, Decreased strength, Increased edema, Impaired flexibility, Pain, Cardiopulmonary status limiting activity, Decreased endurance, Decreased range of motion, Difficulty walking  Visit Diagnosis: Acute pain of right knee  Stiffness of right knee, not elsewhere classified  Localized edema  Difficulty in walking, not elsewhere  classified     Problem List Patient Active Problem List   Diagnosis Date Noted  . Primary osteoarthritis of right knee 06/07/2017  . Primary osteoarthritis of left knee 06/07/2017  . Effusion, left knee 06/07/2017    Abisai Deer,ANGIE PTA 06/27/2017, 3:14 PM  [facility] [address] [place], [place], [address] Phone: [phone]   Fax:  [phone]  Name: Peter Roth MRN: [id number] Date of Birth: [birth year]

## 2017-06-28 ENCOUNTER — Ambulatory Visit: Payer: Medicare Other | Admitting: Physical Therapy

## 2017-06-28 VITALS — BP 180/90

## 2017-06-28 DIAGNOSIS — R600 Localized edema: Secondary | ICD-10-CM

## 2017-06-28 DIAGNOSIS — M25561 Pain in right knee: Secondary | ICD-10-CM

## 2017-06-28 DIAGNOSIS — M25661 Stiffness of right knee, not elsewhere classified: Secondary | ICD-10-CM

## 2017-06-28 DIAGNOSIS — R262 Difficulty in walking, not elsewhere classified: Secondary | ICD-10-CM

## 2017-06-28 NOTE — Therapy (Signed)
[facility] [address] [place], [place], [address] Phone: [phone]   Fax:  [phone]  Physical Therapy Treatment  Patient Details  Name: Peter SchlatterChester K Roth MRN: [id number] Date of Birth: 08-Jan-1961 Referring Provider: Luiz BlareGraves   Encounter Date: 06/28/2017  PT End of Session - 06/28/17 1109    Visit Number  3    Date for PT Re-Evaluation  08/26/17    PT Start Time  1030    PT Stop Time  1130    PT Time Calculation (min)  60 min       Past Medical History:  Diagnosis Date  . Arthritis   . Bipolar 1 disorder (HCC)   . Diabetes mellitus without complication (HCC)    type 2  . Headache    stress related  . High cholesterol   . Hypertension     Past Surgical History:  Procedure Laterality Date  . JOINT REPLACEMENT     Right knee Dr. Luiz BlareGraves 06/07/17  . NO PAST SURGERIES    . TOTAL KNEE ARTHROPLASTY Right 06/07/2017   Procedure: RIGHT TOTAL KNEE ARTHROPLASTY, INJECTION LEFT KNEE WITH FLUID ASPIRATION;  Surgeon: Jodi GeraldsGraves, John, MD;  Location: [facility];  Service: Orthopedics;  Laterality: Right;    Vitals:   06/28/17 1034  BP: (!) 180/90    Subjective Assessment - 06/28/17 1034    Subjective  not feeling great this morning, BP? Sugar?    Currently in Pain?  Yes    Pain Score  2     Pain Location  Knee    Pain Radiating Towards  stiffness                      [facility] Adult PT Treatment/Exercise - 06/28/17 0001      Knee/Hip Exercises: Aerobic   Recumbent Bike  6 min full rev    Nustep  L 5 7 min      Knee/Hip Exercises: Machines for Strengthening   Cybex Leg Press  30# 2 sets 15      Knee/Hip Exercises: Standing   Heel Raises  Both;20 reps    Knee Flexion  -- black bar    Terminal Knee Extension  Strengthening;2 sets;15 reps;Theraband and ball    Forward Step Up  Right;15 reps;Hand Hold: 2;Step Height: 6"      Vasopneumatic   Number Minutes Vasopneumatic   15 minutes    Vasopnuematic Location   Knee    Vasopneumatic Pressure  Medium    Vasopneumatic Temperature   15      Manual Therapy   Manual Therapy  Joint mobilization;Passive ROM    Manual therapy comments  AROM after MT  5- 104    Passive ROM  flex/ext               PT Short Term Goals - 06/27/17 1513      PT SHORT TERM GOAL #1   Title  assure independent with HEP    Baseline  HHPT ther ex    Status  Achieved        PT Long Term Goals - 06/25/17 1341      PT LONG TERM GOAL #1   Title  increase AROM to 5-114 degrees flexion    Time  8    Period  Weeks    Status  New      PT LONG TERM GOAL #2   Title  walk without deviation all community  distances    Time  8    Period  Weeks    Status  New      PT LONG TERM GOAL #3   Title  decrease pain 50%    Time  8    Period  Weeks    Status  New      PT LONG TERM GOAL #4   Title  go up and down stairs step over step    Time  8    Period  Weeks    Status  New            Plan - 06/28/17 1110    Clinical Impression Statement  monitored BP thru session with cuing to breathe, BP maintained and pt seeing PCP thsi after noon. pt still with quad lag and missing ext but ROM improving    PT Treatment/Interventions  ADLs/Self Care Home Management;Cryotherapy;Electrical Stimulation;Gait training;Neuromuscular re-education;Balance training;Therapeutic exercise;Therapeutic activities;Functional mobility training;Stair training;Patient/family education;Manual techniques;Vasopneumatic Device    PT Next Visit Plan  ROM and strength       Patient will benefit from skilled therapeutic intervention in order to improve the following deficits and impairments:  Abnormal gait, Decreased activity tolerance, Decreased balance, Decreased mobility, Decreased strength, Increased edema, Impaired flexibility, Pain, Cardiopulmonary status limiting activity, Decreased endurance, Decreased range of motion, Difficulty walking  Visit Diagnosis: Acute pain  of right knee  Stiffness of right knee, not elsewhere classified  Localized edema  Difficulty in walking, not elsewhere classified     Problem List Patient Active Problem List   Diagnosis Date Noted  . Primary osteoarthritis of right knee 06/07/2017  . Primary osteoarthritis of left knee 06/07/2017  . Effusion, left knee 06/07/2017    PAYSEUR,ANGIE PTA 06/28/2017, 11:13 AM  [facility] [address] [place], [place], [address] Phone: [phone]   Fax:  [phone]  Name: Peter SchlatterChester K Roth MRN: [id number] Date of Birth: 1960/07/18

## 2017-07-01 ENCOUNTER — Ambulatory Visit: Payer: Medicare Other | Admitting: Physical Therapy

## 2017-07-04 ENCOUNTER — Ambulatory Visit: Payer: Medicare Other | Admitting: Physical Therapy

## 2017-07-04 VITALS — BP 185/95

## 2017-07-04 DIAGNOSIS — R262 Difficulty in walking, not elsewhere classified: Secondary | ICD-10-CM

## 2017-07-04 DIAGNOSIS — M25561 Pain in right knee: Secondary | ICD-10-CM | POA: Diagnosis not present

## 2017-07-04 DIAGNOSIS — M25661 Stiffness of right knee, not elsewhere classified: Secondary | ICD-10-CM

## 2017-07-04 DIAGNOSIS — R600 Localized edema: Secondary | ICD-10-CM

## 2017-07-04 NOTE — Therapy (Signed)
[facility] [address] [place], [place], [address] Phone: [phone]   Fax:  [phone]  Physical Therapy Treatment  Patient Details  Name: Peter SchlatterChester K Roth MRN: [id number] Date of Birth: 11-21-1960 Referring Provider: Luiz BlareGraves   Encounter Date: 07/04/2017  PT End of Session - 07/04/17 1137    Visit Number  4    Date for PT Re-Evaluation  08/26/17    PT Start Time  1100    PT Stop Time  1200    PT Time Calculation (min)  60 min       Past Medical History:  Diagnosis Date  . Arthritis   . Bipolar 1 disorder (HCC)   . Diabetes mellitus without complication (HCC)    type 2  . Headache    stress related  . High cholesterol   . Hypertension     Past Surgical History:  Procedure Laterality Date  . JOINT REPLACEMENT     Right knee Dr. Luiz BlareGraves 06/07/17  . NO PAST SURGERIES    . TOTAL KNEE ARTHROPLASTY Right 06/07/2017   Procedure: RIGHT TOTAL KNEE ARTHROPLASTY, INJECTION LEFT KNEE WITH FLUID ASPIRATION;  Surgeon: Jodi GeraldsGraves, John, MD;  Location: [facility];  Service: Orthopedics;  Laterality: Right;    Vitals:   07/04/17 1110  BP: (!) 185/95    Subjective Assessment - 07/04/17 1110    Subjective  was out of town so very stiff    Currently in Pain?  Yes    Pain Score  5     Pain Location  Knee    Pain Orientation  Right         [facility] PT Assessment - 07/04/17 0001      AROM   AROM Assessment Site  Knee    Right/Left Knee  Right    Right Knee Flexion  112                  [facility] Adult PT Treatment/Exercise - 07/04/17 0001      Knee/Hip Exercises: Machines for Strengthening   Cybex Knee Extension  5# 3 sets 5 RT LE only    Cybex Knee Flexion  15# RT only 2 sets 10    Cybex Leg Press  40# 2 sets 15      Knee/Hip Exercises: Seated   Long Arc Quad  Strengthening;Right;15 reps red tband    Hamstring Curl  Strengthening;Right;15 reps red tband      Modalities   Modalities  Special educational needs teacherlectrical  Stimulation      Electrical Stimulation   Electrical Stimulation Location  RT knee    Electrical Stimulation Action  IFC    Electrical Stimulation Goals  Edema      Vasopneumatic   Number Minutes Vasopneumatic   15 minutes    Vasopnuematic Location   Knee    Vasopneumatic Pressure  Medium    Vasopneumatic Temperature   15      Manual Therapy   Manual Therapy  Joint mobilization;Passive ROM    Manual therapy comments  AROM after MT  110               PT Short Term Goals - 06/27/17 1513      PT SHORT TERM GOAL #1   Title  assure independent with HEP    Baseline  HHPT ther ex    Status  Achieved        PT Long Term Goals - 06/25/17 1341  PT LONG TERM GOAL #1   Title  increase AROM to 5-114 degrees flexion    Time  8    Period  Weeks    Status  New      PT LONG TERM GOAL #2   Title  walk without deviation all community distances    Time  8    Period  Weeks    Status  New      PT LONG TERM GOAL #3   Title  decrease pain 50%    Time  8    Period  Weeks    Status  New      PT LONG TERM GOAL #4   Title  go up and down stairs step over step    Time  8    Period  Weeks    Status  New            Plan - 07/04/17 1137    Clinical Impression Statement  stiff at arrival but tolerated ther ex well and good ROM after, responded well to estim and vaso    PT Treatment/Interventions  ADLs/Self Care Home Management;Cryotherapy;Electrical Stimulation;Gait training;Neuromuscular re-education;Balance training;Therapeutic exercise;Therapeutic activities;Functional mobility training;Stair training;Patient/family education;Manual techniques;Vasopneumatic Device    PT Next Visit Plan  ROM and strength       Patient will benefit from skilled therapeutic intervention in order to improve the following deficits and impairments:  Abnormal gait, Decreased activity tolerance, Decreased balance, Decreased mobility, Decreased strength, Increased edema, Impaired  flexibility, Pain, Cardiopulmonary status limiting activity, Decreased endurance, Decreased range of motion, Difficulty walking  Visit Diagnosis: Acute pain of right knee  Stiffness of right knee, not elsewhere classified  Localized edema  Difficulty in walking, not elsewhere classified     Problem List Patient Active Problem List   Diagnosis Date Noted  . Primary osteoarthritis of right knee 06/07/2017  . Primary osteoarthritis of left knee 06/07/2017  . Effusion, left knee 06/07/2017    Bethzaida Boord,ANGIE PTA 07/04/2017, 11:40 AM  [facility] [address] [place], [place], [address] Phone: [phone]   Fax:  [phone]  Name: Peter SchlatterChester K Roth MRN: [id number] Date of Birth: April 26, 1961

## 2017-07-05 ENCOUNTER — Ambulatory Visit: Payer: Medicare Other | Admitting: Physical Therapy

## 2017-07-05 ENCOUNTER — Encounter: Payer: Self-pay | Admitting: Physical Therapy

## 2017-07-05 DIAGNOSIS — M25661 Stiffness of right knee, not elsewhere classified: Secondary | ICD-10-CM

## 2017-07-05 DIAGNOSIS — R600 Localized edema: Secondary | ICD-10-CM

## 2017-07-05 DIAGNOSIS — M25561 Pain in right knee: Secondary | ICD-10-CM

## 2017-07-05 DIAGNOSIS — R262 Difficulty in walking, not elsewhere classified: Secondary | ICD-10-CM

## 2017-07-05 NOTE — Therapy (Signed)
[facility] [address] [place], [place], [address] Phone: [phone]   Fax:  [phone]  Physical Therapy Treatment  Patient Details  Name: Peter Roth MRN: [id number] Date of Birth: 22-Jan-1961 Referring Provider: Luiz BlareGraves   Encounter Date: 07/05/2017  PT End of Session - 07/05/17 1139    Visit Number  5    Date for PT Re-Evaluation  08/26/17    PT Start Time  1100    PT Stop Time  1158    PT Time Calculation (min)  58 min       Past Medical History:  Diagnosis Date  . Arthritis   . Bipolar 1 disorder (HCC)   . Diabetes mellitus without complication (HCC)    type 2  . Headache    stress related  . High cholesterol   . Hypertension     Past Surgical History:  Procedure Laterality Date  . JOINT REPLACEMENT     Right knee Dr. Luiz BlareGraves 06/07/17  . NO PAST SURGERIES    . TOTAL KNEE ARTHROPLASTY Right 06/07/2017   Procedure: RIGHT TOTAL KNEE ARTHROPLASTY, INJECTION LEFT KNEE WITH FLUID ASPIRATION;  Surgeon: Jodi GeraldsGraves, John, MD;  [facility];  Service: Orthopedics;  Laterality: Right;    There were no vitals filed for this visit.  Subjective Assessment - 07/05/17 1058    Subjective  stiff on RT . non surgical knee really bothering me    Pain Score  3     Pain Location  Knee    Pain Orientation  Right                      [facility] Adult PT Treatment/Exercise - 07/05/17 0001      Transfers   Comments  simulated tub transfer on floor      Knee/Hip Exercises: Aerobic   Elliptical  2 fwd/2 back I 10 R 4    Recumbent Bike  6 min full rev    Nustep  L 5 7 min legs only      Knee/Hip Exercises: Machines for Strengthening   Cybex Knee Extension  5# 3 sets 5    Cybex Knee Flexion  15# RT only 2 sets 10    Cybex Leg Press  40# 2 sets 15      Knee/Hip Exercises: Standing   Other Standing Knee Exercises  resisted gait step ups      Electrical Stimulation   Electrical Stimulation  Location  RT knee    Electrical Stimulation Action  IFC    Electrical Stimulation Goals  Edema;Pain      Vasopneumatic   Number Minutes Vasopneumatic   15 minutes    Vasopnuematic Location   Knee    Vasopneumatic Pressure  Medium    Vasopneumatic Temperature   15               PT Short Term Goals - 06/27/17 1513      PT SHORT TERM GOAL #1   Title  assure independent with HEP    Baseline  HHPT ther ex    Status  Achieved        PT Long Term Goals - 06/25/17 1341      PT LONG TERM GOAL #1   Title  increase AROM to 5-114 degrees flexion    Time  8    Period  Weeks    Status  New      PT LONG TERM  GOAL #2   Title  walk without deviation all community distances    Time  8    Period  Weeks    Status  New      PT LONG TERM GOAL #3   Title  decrease pain 50%    Time  8    Period  Weeks    Status  New      PT LONG TERM GOAL #4   Title  go up and down stairs step over step    Time  8    Period  Weeks    Status  New            Plan - 07/05/17 1139    Clinical Impression Statement  pt with c/o non surgical knee pain as it needs surgery too so modified ex to decrease left knee strain. increased func ROM    PT Treatment/Interventions  ADLs/Self Care Home Management;Cryotherapy;Electrical Stimulation;Gait training;Neuromuscular re-education;Balance training;Therapeutic exercise;Therapeutic activities;Functional mobility training;Stair training;Patient/family education;Manual techniques;Vasopneumatic Device    PT Next Visit Plan  ROM/ strength/gait       Patient will benefit from skilled therapeutic intervention in order to improve the following deficits and impairments:  Abnormal gait, Decreased activity tolerance, Decreased balance, Decreased mobility, Decreased strength, Increased edema, Impaired flexibility, Pain, Cardiopulmonary status limiting activity, Decreased endurance, Decreased range of motion, Difficulty walking  Visit Diagnosis: Acute pain of  right knee  Stiffness of right knee, not elsewhere classified  Localized edema  Difficulty in walking, not elsewhere classified     Problem List Patient Active Problem List   Diagnosis Date Noted  . Primary osteoarthritis of right knee 06/07/2017  . Primary osteoarthritis of left knee 06/07/2017  . Effusion, left knee 06/07/2017    Annakate Soulier,ANGIE PTA 07/05/2017, 11:41 AM  [facility] [address] [place], [place], [address] Phone: [phone]   Fax:  [phone]  Name: Peter SchlatterChester K Roth MRN: [id number] Date of Birth: Apr 10, 1961

## 2017-07-10 ENCOUNTER — Encounter: Payer: Self-pay | Admitting: Physical Therapy

## 2017-07-10 ENCOUNTER — Ambulatory Visit: Payer: Medicare Other | Attending: Orthopedic Surgery | Admitting: Physical Therapy

## 2017-07-10 DIAGNOSIS — R262 Difficulty in walking, not elsewhere classified: Secondary | ICD-10-CM

## 2017-07-10 DIAGNOSIS — M25561 Pain in right knee: Secondary | ICD-10-CM

## 2017-07-10 DIAGNOSIS — R600 Localized edema: Secondary | ICD-10-CM | POA: Diagnosis present

## 2017-07-10 DIAGNOSIS — M25661 Stiffness of right knee, not elsewhere classified: Secondary | ICD-10-CM | POA: Diagnosis present

## 2017-07-10 NOTE — Therapy (Signed)
[facility] [address] [place], [place], [address] Phone: [phone]   Fax:  [phone]  Physical Therapy Treatment  Patient Details  Name: Peter Roth MRN: [id number] Date of Birth: December 21, 1960 Referring Provider: Luiz Blare   Encounter Date: 07/10/2017  PT End of Session - 07/10/17 1513    Visit Number  6    Date for PT Re-Evaluation  08/26/17    PT Start Time  1430    PT Stop Time  1528    PT Time Calculation (min)  58 min    Activity Tolerance  Patient tolerated treatment well    Behavior During Therapy  [facility] for tasks assessed/performed       Past Medical History:  Diagnosis Date  . Arthritis   . Bipolar 1 disorder (HCC)   . Diabetes mellitus without complication (HCC)    type 2  . Headache    stress related  . High cholesterol   . Hypertension     Past Surgical History:  Procedure Laterality Date  . JOINT REPLACEMENT     Right knee Dr. Luiz Blare 06/07/17  . NO PAST SURGERIES    . TOTAL KNEE ARTHROPLASTY Right 06/07/2017   Procedure: RIGHT TOTAL KNEE ARTHROPLASTY, INJECTION LEFT KNEE WITH FLUID ASPIRATION;  Surgeon: Jodi Geralds, MD;  Location: [facility];  Service: Orthopedics;  Laterality: Right;    There were no vitals filed for this visit.  Subjective Assessment - 07/10/17 1434    Subjective  Pt reports that he is not feeling well today due to his sinuses. PT reports some R knee stiffness    Currently in Pain?  Yes    Pain Score  4  took 2 pain pills    Pain Location  Knee    Pain Orientation  Right                      [facility] Adult PT Treatment/Exercise - 07/10/17 0001      Knee/Hip Exercises: Aerobic   Elliptical  2 fwd/2 back I 10 R 4    Recumbent Bike  4 min     Nustep  L 6 6 min 3 of the minutes LE only      Knee/Hip Exercises: Machines for Strengthening   Cybex Leg Press  30# 3 sets 10      Knee/Hip Exercises: Standing   Lateral Step Up  Right;2 sets;10 reps;Hand  Hold: 0;Step Height: 6"    Forward Step Up  Right;15 reps;Hand Hold: 2;Step Height: 6";Hand Hold: 0    Other Standing Knee Exercises  toe clears 8in x10, 10in x10      Vasopneumatic   Number Minutes Vasopneumatic   15 minutes    Vasopnuematic Location   Knee    Vasopneumatic Pressure  Medium    Vasopneumatic Temperature   15      Manual Therapy   Manual Therapy  Passive ROM    Passive ROM  flex/ext               PT Short Term Goals - 06/27/17 1513      PT SHORT TERM GOAL #1   Title  assure independent with HEP    Baseline  HHPT ther ex    Status  Achieved        PT Long Term Goals - 06/25/17 1341      PT LONG TERM GOAL #1   Title  increase AROM to 5-114 degrees  flexion    Time  8    Period  Weeks    Status  New      PT LONG TERM GOAL #2   Title  walk without deviation all community distances    Time  8    Period  Weeks    Status  New      PT LONG TERM GOAL #3   Title  decrease pain 50%    Time  8    Period  Weeks    Status  New      PT LONG TERM GOAL #4   Title  go up and down stairs step over step    Time  8    Period  Weeks    Status  New            Plan - 07/10/17 1513    Clinical Impression Statement  Pt reports increase R knee stiffness and fatigue. Pt lacks R knee flex and ext noted with MT. Some pain at the end range of flexion during MT. All exercises completed well, pt does have some compensation with 109 in toe clears.    Rehab Potential  Good    PT Frequency  3x / week    PT Duration  8 weeks    PT Treatment/Interventions  ADLs/Self Care Home Management;Cryotherapy;Electrical Stimulation;Gait training;Neuromuscular re-education;Balance training;Therapeutic exercise;Therapeutic activities;Functional mobility training;Stair training;Patient/family education;Manual techniques;Vasopneumatic Device    PT Next Visit Plan  ROM/ strength/gait       Patient will benefit from skilled therapeutic intervention in order to improve the  following deficits and impairments:  Abnormal gait, Decreased activity tolerance, Decreased balance, Decreased mobility, Decreased strength, Increased edema, Impaired flexibility, Pain, Cardiopulmonary status limiting activity, Decreased endurance, Decreased range of motion, Difficulty walking  Visit Diagnosis: Acute pain of right knee  Stiffness of right knee, not elsewhere classified  Difficulty in walking, not elsewhere classified  Localized edema     Problem List Patient Active Problem List   Diagnosis Date Noted  . Primary osteoarthritis of right knee 06/07/2017  . Primary osteoarthritis of left knee 06/07/2017  . Effusion, left knee 06/07/2017    Grayce Sessionsonald G Pablo Stauffer, PTA 07/10/2017, 3:22 PM  [facility] [address] [place], [place], [address] Phone: [phone]   Fax:  [phone]  Name: Peter Roth MRN: [id number] Date of Birth: 08/26/1960

## 2017-07-11 ENCOUNTER — Ambulatory Visit: Payer: Medicare Other | Admitting: Physical Therapy

## 2017-07-11 DIAGNOSIS — R600 Localized edema: Secondary | ICD-10-CM

## 2017-07-11 DIAGNOSIS — M25561 Pain in right knee: Secondary | ICD-10-CM | POA: Diagnosis not present

## 2017-07-11 DIAGNOSIS — M25661 Stiffness of right knee, not elsewhere classified: Secondary | ICD-10-CM

## 2017-07-11 DIAGNOSIS — R262 Difficulty in walking, not elsewhere classified: Secondary | ICD-10-CM

## 2017-07-11 NOTE — Therapy (Signed)
[address] [place], [place], [address] Phone: [phone]   Fax:  [phone]  Physical Therapy Treatment  Patient Details  Name: Peter Roth MRN: [id number] Date of Birth: 26-Sep-1960 Referring Provider: Berenice Primas   Encounter Date: 07/11/2017  PT End of Session - 07/11/17 1113    Visit Number  7    PT Start Time  6962    PT Stop Time  1135    PT Time Calculation (min)  55 min       Past Medical History:  Diagnosis Date  . Arthritis   . Bipolar 1 disorder ([place])   . Diabetes mellitus without complication ([place])    type 2  . Headache    stress related  . High cholesterol   . Hypertension     Past Surgical History:  Procedure Laterality Date  . JOINT REPLACEMENT     Right knee Dr. Berenice Primas 06/07/17  . NO PAST SURGERIES    . TOTAL KNEE ARTHROPLASTY Right 06/07/2017   Procedure: RIGHT TOTAL KNEE ARTHROPLASTY, INJECTION LEFT KNEE WITH FLUID ASPIRATION;  Surgeon: Dorna Leitz, MD;  Location: [facility];  Service: Orthopedics;  Laterality: Right;    There were no vitals filed for this visit.  Subjective Assessment - 07/11/17 1039    Subjective  stiff today- weather isnt helping. MD pulled fluid out of LEft knee ( maybe have it replaced in spring)    Currently in Pain?  Yes    Pain Score  4     Pain Location  Knee    Pain Orientation  Right         [facility] PT Assessment - 07/11/17 0001      AROM   AROM Assessment Site  Knee    Right/Left Knee  Right    Right Knee Extension  3    Right Knee Flexion  113                  [facility] Adult PT Treatment/Exercise - 07/11/17 0001      Knee/Hip Exercises: Aerobic   Elliptical  2 fwd/2 back I 10 R 4    Recumbent Bike  6 min    Nustep  L 5 6 min - LE only      Knee/Hip Exercises: Machines for Strengthening   Cybex Knee Extension  5# 3 sets 5    Cybex Knee Flexion  15# RT only 2 sets 10    Cybex Leg Press  40# 3 sets 10      Knee/Hip  Exercises: Standing   Lateral Step Up  Right;15 reps;Hand Hold: 2;Step Height: 6"    Forward Step Up  Right;15 reps;Hand Hold: 2;Step Height: 6"    Other Standing Knee Exercises  toe clears 8in x10, 10in x10      Vasopneumatic   Number Minutes Vasopneumatic   15 minutes    Vasopnuematic Location   Knee    Vasopneumatic Pressure  Medium    Vasopneumatic Temperature   15               PT Short Term Goals - 06/27/17 1513      PT SHORT TERM GOAL #1   Title  assure independent with HEP    Baseline  HHPT ther ex    Status  Achieved        PT Long Term Goals - 07/11/17 1114      PT LONG TERM GOAL #1  Title  increase AROM to 5-114 degrees flexion    Status  Partially Met      PT LONG TERM GOAL #2   Title  walk without deviation all community distances    Status  Achieved      PT LONG TERM GOAL #3   Title  decrease pain 50%    Status  Partially Met      PT LONG TERM GOAL #4   Title  go up and down stairs step over step    Status  Partially Met            Plan - 07/11/17 1113    Clinical Impression Statement  steady increase in ROM and func and working towards goals. non surgical knee is giving him issues as it needs replaced too    PT Treatment/Interventions  ADLs/Self Care Home Management;Cryotherapy;Electrical Stimulation;Gait training;Neuromuscular re-education;Balance training;Therapeutic exercise;Therapeutic activities;Functional mobility training;Stair training;Patient/family education;Manual techniques;Vasopneumatic Device    PT Next Visit Plan  ROM/ strength/gait       Patient will benefit from skilled therapeutic intervention in order to improve the following deficits and impairments:  Abnormal gait, Decreased activity tolerance, Decreased balance, Decreased mobility, Decreased strength, Increased edema, Impaired flexibility, Pain, Cardiopulmonary status limiting activity, Decreased endurance, Decreased range of motion, Difficulty walking  Visit  Diagnosis: Acute pain of right knee  Stiffness of right knee, not elsewhere classified  Difficulty in walking, not elsewhere classified  Localized edema     Problem List Patient Active Problem List   Diagnosis Date Noted  . Primary osteoarthritis of right knee 06/07/2017  . Primary osteoarthritis of left knee 06/07/2017  . Effusion, left knee 06/07/2017    Vaishali Baise,ANGIE PTA 07/11/2017, 11:17 AM  [address] [address] [place], [place], [address] Phone: [phone]   Fax:  [phone]  Name: Peter Roth MRN: [id number] Date of Birth: 09/19/1960

## 2017-07-15 ENCOUNTER — Encounter: Payer: Self-pay | Admitting: Physical Therapy

## 2017-07-15 ENCOUNTER — Ambulatory Visit: Payer: Medicare Other | Admitting: Physical Therapy

## 2017-07-15 DIAGNOSIS — M25661 Stiffness of right knee, not elsewhere classified: Secondary | ICD-10-CM

## 2017-07-15 DIAGNOSIS — M25561 Pain in right knee: Secondary | ICD-10-CM | POA: Diagnosis not present

## 2017-07-15 DIAGNOSIS — R262 Difficulty in walking, not elsewhere classified: Secondary | ICD-10-CM

## 2017-07-15 DIAGNOSIS — R600 Localized edema: Secondary | ICD-10-CM

## 2017-07-15 NOTE — Therapy (Signed)
[address] [place], [place], [address] Phone: [phone]   Fax:  [phone]  Physical Therapy Treatment  Patient Details  Name: Peter Roth MRN: [id number] Date of Birth: 1960-08-11 Referring Provider: Berenice Primas   Encounter Date: 07/15/2017  PT End of Session - 07/15/17 1430    Date for PT Re-Evaluation  08/26/17    PT Start Time  1345    PT Stop Time  1444    PT Time Calculation (min)  59 min    Activity Tolerance  Patient tolerated treatment well    Behavior During Therapy  [facility] for tasks assessed/performed       Past Medical History:  Diagnosis Date  . Arthritis   . Bipolar 1 disorder ([place])   . Diabetes mellitus without complication ([place])    type 2  . Headache    stress related  . High cholesterol   . Hypertension     Past Surgical History:  Procedure Laterality Date  . JOINT REPLACEMENT     Right knee Dr. Berenice Primas 06/07/17  . NO PAST SURGERIES    . TOTAL KNEE ARTHROPLASTY Right 06/07/2017   Procedure: RIGHT TOTAL KNEE ARTHROPLASTY, INJECTION LEFT KNEE WITH FLUID ASPIRATION;  Surgeon: Dorna Leitz, MD;  Location: [facility];  Service: Orthopedics;  Laterality: Right;    There were no vitals filed for this visit.  Subjective Assessment - 07/15/17 1343    Subjective  Pt reports that he is going to the MD to see if he can get his other knee done. Pt reports more issues with non surgical knee.    Currently in Pain?  Yes    Pain Score  3     Pain Location  Knee    Pain Orientation  Right                      [facility] Adult PT Treatment/Exercise - 07/15/17 0001      Knee/Hip Exercises: Aerobic   Elliptical  2 fwd/2 back I 10 R 4    Recumbent Bike  4 min    Nustep  L 5 6 min - LE only for .5 the time       Knee/Hip Exercises: Machines for Strengthening   Cybex Knee Extension  5# 3 sets 5    Cybex Knee Flexion  15# RT only 2 sets 10    Cybex Leg Press  40# 3 sets 10      Knee/Hip Exercises: Standing   Heel Raises  Both;20 reps    Other Standing Knee Exercises  toe clears 8in x10, 10in x10      Knee/Hip Exercises: Supine   Short Arc Quad Sets  Right;10 reps;3 Occupational psychologist Location  RT knee    Electrical Stimulation Action  IFC    Electrical Stimulation Parameters  supine to pt tolerance    Electrical Stimulation Goals  Edema;Pain      Vasopneumatic   Number Minutes Vasopneumatic   15 minutes    Vasopnuematic Location   Knee    Vasopneumatic Pressure  Medium    Vasopneumatic Temperature   15      Manual Therapy   Manual Therapy  Passive ROM    Passive ROM  flex/ext               PT Short Term Goals - 06/27/17 1513  PT SHORT TERM GOAL #1   Title  assure independent with HEP    Baseline  HHPT ther ex    Status  Achieved        PT Long Term Goals - 07/11/17 1114      PT LONG TERM GOAL #1   Title  increase AROM to 5-114 degrees flexion    Status  Partially Met      PT LONG TERM GOAL #2   Title  walk without deviation all community distances    Status  Achieved      PT LONG TERM GOAL #3   Title  decrease pain 50%    Status  Partially Met      PT LONG TERM GOAL #4   Title  go up and down stairs step over step    Status  Partially Met            Plan - 07/15/17 1435    Clinical Impression Statement  Pt reports more issues with non surgical knee in regards to pain. MT focuses more on extension. Progressing towards goals    Rehab Potential  Good    PT Frequency  3x / week    PT Duration  8 weeks    PT Treatment/Interventions  ADLs/Self Care Home Management;Cryotherapy;Electrical Stimulation;Gait training;Neuromuscular re-education;Balance training;Therapeutic exercise;Therapeutic activities;Functional mobility training;Stair training;Patient/family education;Manual techniques;Vasopneumatic Device    PT Next Visit Plan  ROM/ strength/gait       Patient will benefit from  skilled therapeutic intervention in order to improve the following deficits and impairments:  Abnormal gait, Decreased activity tolerance, Decreased balance, Decreased mobility, Decreased strength, Increased edema, Impaired flexibility, Pain, Cardiopulmonary status limiting activity, Decreased endurance, Decreased range of motion, Difficulty walking  Visit Diagnosis: Acute pain of right knee  Stiffness of right knee, not elsewhere classified  Difficulty in walking, not elsewhere classified  Localized edema     Problem List Patient Active Problem List   Diagnosis Date Noted  . Primary osteoarthritis of right knee 06/07/2017  . Primary osteoarthritis of left knee 06/07/2017  . Effusion, left knee 06/07/2017    Scot Jun, PTA 07/15/2017, 2:37 PM  [place] [address] [place], [place], [address] Phone: [phone]   Fax:  [phone]  Name: Peter Roth MRN: [id number] Date of Birth: 1961-01-01

## 2017-07-16 ENCOUNTER — Ambulatory Visit: Payer: Medicare Other | Admitting: Physical Therapy

## 2017-07-18 ENCOUNTER — Encounter: Payer: Self-pay | Admitting: Physical Therapy

## 2017-07-18 ENCOUNTER — Ambulatory Visit: Payer: Medicare Other | Admitting: Physical Therapy

## 2017-07-18 DIAGNOSIS — M25561 Pain in right knee: Secondary | ICD-10-CM

## 2017-07-18 DIAGNOSIS — M25661 Stiffness of right knee, not elsewhere classified: Secondary | ICD-10-CM

## 2017-07-18 DIAGNOSIS — R600 Localized edema: Secondary | ICD-10-CM

## 2017-07-18 DIAGNOSIS — R262 Difficulty in walking, not elsewhere classified: Secondary | ICD-10-CM

## 2017-07-18 NOTE — Therapy (Signed)
[address] [place], [place], [address] Phone: [phone]   Fax:  [phone]  Physical Therapy Treatment  Patient Details  Name: Peter Roth MRN: [id number] Date of Birth: May 23, 1961 Referring Provider: Berenice Primas   Encounter Date: 07/18/2017  PT End of Session - 07/18/17 1138    Visit Number  8    Date for PT Re-Evaluation  08/26/17    PT Start Time  1100    PT Stop Time  1155    PT Time Calculation (min)  55 min       Past Medical History:  Diagnosis Date  . Arthritis   . Bipolar 1 disorder ([place])   . Diabetes mellitus without complication ([place])    type 2  . Headache    stress related  . High cholesterol   . Hypertension     Past Surgical History:  Procedure Laterality Date  . JOINT REPLACEMENT     Right knee Dr. Berenice Primas 06/07/17  . NO PAST SURGERIES    . TOTAL KNEE ARTHROPLASTY Right 06/07/2017   Procedure: RIGHT TOTAL KNEE ARTHROPLASTY, INJECTION LEFT KNEE WITH FLUID ASPIRATION;  Surgeon: Dorna Leitz, MD;  Location: [facility];  Service: Orthopedics;  Laterality: Right;    There were no vitals filed for this visit.  Subjective Assessment - 07/18/17 1100    Subjective  going to have other knee done in Feb. MD very pleased need to work on ext    Currently in Pain?  Yes    Pain Score  3     Pain Location  Knee         [facility] PT Assessment - 07/18/17 0001      AROM   Right Knee Extension  2    Right Knee Flexion  113                  [facility] Adult PT Treatment/Exercise - 07/18/17 0001      Knee/Hip Exercises: Aerobic   Elliptical  4 min backward for ext I 8 R 4    Tread Mill  TM push for ext 20    Nustep  L 5 6 min - LE only       Knee/Hip Exercises: Machines for Strengthening   Cybex Knee Extension  5# 3 sets 5    Cybex Leg Press  40# 3 sets 10 focus on TKE      Knee/Hip Exercises: Standing   Heel Raises  20 reps    Other Standing Knee Exercises  cable press  downs 20 50#    Other Standing Knee Exercises  TKE green tband 2 sets 15      Vasopneumatic   Number Minutes Vasopneumatic   15 minutes    Vasopnuematic Location   Knee    Vasopneumatic Pressure  Medium    Vasopneumatic Temperature   15      Manual Therapy   Manual Therapy  Joint mobilization    Joint Mobilization  belt mob for ext               PT Short Term Goals - 06/27/17 1513      PT SHORT TERM GOAL #1   Title  assure independent with HEP    Baseline  HHPT ther ex    Status  Achieved        PT Long Term Goals - 07/18/17 1138      PT LONG TERM GOAL #1  Title  increase AROM to 5-114 degrees flexion    Status  Partially Met      PT LONG TERM GOAL #2   Title  walk without deviation all community distances    Status  Achieved      PT LONG TERM GOAL #3   Title  decrease pain 50%    Period  Days    Status  Achieved      PT LONG TERM GOAL #4   Title  go up and down stairs step over step    Status  Achieved            Plan - 07/18/17 1140    Clinical Impression Statement  focus session today on TKE and quad activation with cuing AROM 3-112       Patient will benefit from skilled therapeutic intervention in order to improve the following deficits and impairments:     Visit Diagnosis: Acute pain of right knee  Stiffness of right knee, not elsewhere classified  Difficulty in walking, not elsewhere classified  Localized edema     Problem List Patient Active Problem List   Diagnosis Date Noted  . Primary osteoarthritis of right knee 06/07/2017  . Primary osteoarthritis of left knee 06/07/2017  . Effusion, left knee 06/07/2017    Court Gracia,ANGIE PTA 07/18/2017, 11:41 AM  [address] [place], [place], [address] Phone: [phone]   Fax:  [phone]  Name: Peter Roth MRN: [id number] Date of Birth: Sep 01, 1960

## 2017-07-23 ENCOUNTER — Encounter: Payer: Self-pay | Admitting: Physical Therapy

## 2017-07-23 ENCOUNTER — Ambulatory Visit: Payer: Medicare Other | Admitting: Physical Therapy

## 2017-07-23 DIAGNOSIS — M25561 Pain in right knee: Secondary | ICD-10-CM

## 2017-07-23 DIAGNOSIS — M25661 Stiffness of right knee, not elsewhere classified: Secondary | ICD-10-CM

## 2017-07-23 DIAGNOSIS — R262 Difficulty in walking, not elsewhere classified: Secondary | ICD-10-CM

## 2017-07-23 NOTE — Therapy (Signed)
[address] [place], [place], [address] Phone: [phone]   Fax:  [phone]  Physical Therapy Treatment  Patient Details  Name: Peter Roth MRN: [id number] Date of Birth: [birth year] Referring Provider: Berenice Primas   Encounter Date: 07/23/2017  PT End of Session - 07/23/17 1137    Visit Number  9    Date for PT Re-Evaluation  08/26/17    PT Start Time  1105    PT Stop Time  1155    PT Time Calculation (min)  50 min       Past Medical History:  Diagnosis Date  . Arthritis   . Bipolar 1 disorder ([place])   . Diabetes mellitus without complication ([place])    type 2  . Headache    stress related  . High cholesterol   . Hypertension     Past Surgical History:  Procedure Laterality Date  . JOINT REPLACEMENT     Right knee Dr. Berenice Primas 06/07/17  . NO PAST SURGERIES    . TOTAL KNEE ARTHROPLASTY Right 06/07/2017   Procedure: RIGHT TOTAL KNEE ARTHROPLASTY, INJECTION LEFT KNEE WITH FLUID ASPIRATION;  Surgeon: Dorna Leitz, MD;  Location: [facility];  Service: Orthopedics;  Laterality: Right;    There were no vitals filed for this visit.  Subjective Assessment - 07/23/17 1108    Subjective  Feb 15 having other knee done    Currently in Pain?  Yes    Pain Score  2     Pain Location  Knee                      [facility] Adult PT Treatment/Exercise - 07/23/17 0001      Knee/Hip Exercises: Aerobic   Recumbent Bike  6 min    Nustep  L 5 6 min - LE only       Knee/Hip Exercises: Machines for Strengthening   Cybex Knee Extension  10# 4 sets 5 bilaterally    Cybex Knee Flexion  20# 2 sets 10 Bilaterally    Cybex Leg Press  40# 3 sets 10      Knee/Hip Exercises: Standing   Heel Raises  20 reps;Both toe raises    Terminal Knee Extension  Strengthening;2 sets;15 reps;Theraband      Vasopneumatic   Number Minutes Vasopneumatic   --    Vasopnuematic Location   --    Vasopneumatic Pressure  --     Vasopneumatic Temperature   --      Manual Therapy   Manual Therapy  Joint mobilization    Joint Mobilization  belt mob for ext               PT Short Term Goals - 06/27/17 1513      PT SHORT TERM GOAL #1   Title  assure independent with HEP    Baseline  HHPT ther ex    Status  Achieved        PT Long Term Goals - 07/18/17 1138      PT LONG TERM GOAL #1   Title  increase AROM to 5-114 degrees flexion    Status  Partially Met      PT LONG TERM GOAL #2   Title  walk without deviation all community distances    Status  Achieved      PT LONG TERM GOAL #3   Title  decrease pain 50%    Period  Days  Status  Achieved      PT LONG TERM GOAL #4   Title  go up and down stairs step over step    Status  Achieved            Plan - 07/23/17 1142    Clinical Impression Statement  treated use of BLE on ex today, cuing today for TKE with ther ex. ice pack to BLE    PT Treatment/Interventions  ADLs/Self Care Home Management;Cryotherapy;Electrical Stimulation;Gait training;Neuromuscular re-education;Balance training;Therapeutic exercise;Therapeutic activities;Functional mobility training;Stair training;Patient/family education;Manual techniques;Vasopneumatic Device    PT Next Visit Plan  ROM/ strength/gait- check ROM and goals       Patient will benefit from skilled therapeutic intervention in order to improve the following deficits and impairments:  Abnormal gait, Decreased activity tolerance, Decreased balance, Decreased mobility, Decreased strength, Increased edema, Impaired flexibility, Pain, Cardiopulmonary status limiting activity, Decreased endurance, Decreased range of motion, Difficulty walking  Visit Diagnosis: Acute pain of right knee  Stiffness of right knee, not elsewhere classified  Difficulty in walking, not elsewhere classified     Problem List Patient Active Problem List   Diagnosis Date Noted  . Primary osteoarthritis of right knee  06/07/2017  . Primary osteoarthritis of left knee 06/07/2017  . Effusion, left knee 06/07/2017    Makiya Jeune,ANGIE  PTA 07/23/2017, 11:43 AM  [address] [place], [place], [address] Phone: [phone]   Fax:  [phone]  Name: Peter Roth MRN: [id number] Date of Birth: 1961/07/07

## 2017-07-24 ENCOUNTER — Other Ambulatory Visit: Payer: Self-pay | Admitting: Orthopedic Surgery

## 2017-07-25 ENCOUNTER — Ambulatory Visit: Payer: Medicare Other | Admitting: Physical Therapy

## 2017-07-26 ENCOUNTER — Ambulatory Visit: Payer: Medicare Other | Admitting: Physical Therapy

## 2017-07-26 DIAGNOSIS — R262 Difficulty in walking, not elsewhere classified: Secondary | ICD-10-CM

## 2017-07-26 DIAGNOSIS — M25561 Pain in right knee: Secondary | ICD-10-CM

## 2017-07-26 DIAGNOSIS — M25661 Stiffness of right knee, not elsewhere classified: Secondary | ICD-10-CM

## 2017-07-26 DIAGNOSIS — R600 Localized edema: Secondary | ICD-10-CM

## 2017-07-26 NOTE — Therapy (Signed)
[address] [place], [place], [address] Phone: [phone]   Fax:  [phone]  Physical Therapy Treatment  Patient Details  Name: Peter Roth MRN: [id number] Date of Birth: [birth year] Referring Provider: Berenice Primas   Encounter Date: 07/26/2017  PT End of Session - 07/26/17 0908    Visit Number  10    Date for PT Re-Evaluation  08/26/17    PT Start Time  0830    PT Stop Time  0930    PT Time Calculation (min)  60 min       Past Medical History:  Diagnosis Date  . Arthritis   . Bipolar 1 disorder ([place])   . Diabetes mellitus without complication ([place])    type 2  . Headache    stress related  . High cholesterol   . Hypertension     Past Surgical History:  Procedure Laterality Date  . JOINT REPLACEMENT     Right knee Dr. Berenice Primas 06/07/17  . NO PAST SURGERIES    . TOTAL KNEE ARTHROPLASTY Right 06/07/2017   Procedure: RIGHT TOTAL KNEE ARTHROPLASTY, INJECTION LEFT KNEE WITH FLUID ASPIRATION;  Surgeon: Dorna Leitz, MD;  Location: [facility];  Service: Orthopedics;  Laterality: Right;    There were no vitals filed for this visit.  Subjective Assessment - 07/26/17 0830    Subjective  trying to decrease pain meds, didnt take any this morning    Currently in Pain?  Yes    Pain Score  5     Pain Location  Knee    Pain Orientation  Right;Left         [facility] PT Assessment - 07/26/17 0001      AROM   Right Knee Extension  2    Right Knee Flexion  112      Strength   Overall Strength Comments  4+/5                  [facility] Adult PT Treatment/Exercise - 07/26/17 0001      Knee/Hip Exercises: Aerobic   Recumbent Bike  6 min    Nustep  L 5 6 min - LE only       Knee/Hip Exercises: Machines for Strengthening   Cybex Knee Extension  10# 2 sets 10    Cybex Knee Flexion  25# 2 sets 10    Cybex Leg Press  40# 3 sets 10 40# calf raises 2 sets 15      Knee/Hip Exercises: Standing   Heel  Raises  20 reps;Both toe raises 20 times    Lateral Step Up  Right;15 reps;Hand Hold: 2;Step Height: 6"    Forward Step Up  Right;15 reps;Hand Hold: 2;Step Height: 6"    Walking with Sports Cord  backward to work [employer]    Other Standing Knee Exercises  rocker board 20     Other Standing Knee Exercises  TKE green tband 2 sets 15      Vasopneumatic   Number Minutes Vasopneumatic   15 minutes    Vasopnuematic Location   Knee    Vasopneumatic Pressure  Medium    Vasopneumatic Temperature   15      Manual Therapy   Manual Therapy  Passive ROM    Passive ROM  flex/ext             PT Education - 07/26/17 0908    Education provided  Yes    Education Details  calf stretch on step    Person(s) Educated  Patient    Methods  Explanation;Demonstration    Comprehension  Verbalized understanding;Returned demonstration       PT Short Term Goals - 06/27/17 1513      PT SHORT TERM GOAL #1   Title  assure independent with HEP    Baseline  HHPT ther ex    Status  Achieved        PT Long Term Goals - 07/26/17 0909      PT LONG TERM GOAL #1   Title  increase AROM to 5-114 degrees flexion    Status  Partially Met      PT LONG TERM GOAL #2   Title  walk without deviation all community distances    Status  Achieved      PT LONG TERM GOAL #3   Title  decrease pain 50%    Status  Achieved      PT LONG TERM GOAL #4   Title  go up and down stairs step over step    Status  Achieved            Plan - 07/26/17 0909    Clinical Impression Statement  progressing with goals. able to use knees BIL with ex . MMT 5/5 but does fatigue quickly an dneeds cues to activate quad with ex to increase TKE.    PT Treatment/Interventions  ADLs/Self Care Home Management;Cryotherapy;Electrical Stimulation;Gait training;Neuromuscular re-education;Balance training;Therapeutic exercise;Therapeutic activities;Functional mobility training;Stair training;Patient/family education;Manual  techniques;Vasopneumatic Device    PT Next Visit Plan  ROM/ strength/gait       Patient will benefit from skilled therapeutic intervention in order to improve the following deficits and impairments:  Abnormal gait, Decreased activity tolerance, Decreased balance, Decreased mobility, Decreased strength, Increased edema, Impaired flexibility, Pain, Cardiopulmonary status limiting activity, Decreased endurance, Decreased range of motion, Difficulty walking  Visit Diagnosis: Acute pain of right knee  Stiffness of right knee, not elsewhere classified  Difficulty in walking, not elsewhere classified  Localized edema     Problem List Patient Active Problem List   Diagnosis Date Noted  . Primary osteoarthritis of right knee 06/07/2017  . Primary osteoarthritis of left knee 06/07/2017  . Effusion, left knee 06/07/2017    Burch Marchuk,ANGIE PTA 07/26/2017, 9:11 AM  [address] [place], [place], [address] Phone: [phone]   Fax:  [phone]  Name: DIONTE BLAUSTEIN MRN: [id number] Date of Birth: 20-Sep-1960

## 2017-07-30 ENCOUNTER — Encounter: Payer: Self-pay | Admitting: Physical Therapy

## 2017-07-30 ENCOUNTER — Ambulatory Visit: Payer: Medicare Other | Admitting: Physical Therapy

## 2017-07-30 DIAGNOSIS — R600 Localized edema: Secondary | ICD-10-CM

## 2017-07-30 DIAGNOSIS — M25561 Pain in right knee: Secondary | ICD-10-CM

## 2017-07-30 DIAGNOSIS — M25661 Stiffness of right knee, not elsewhere classified: Secondary | ICD-10-CM

## 2017-07-30 DIAGNOSIS — R262 Difficulty in walking, not elsewhere classified: Secondary | ICD-10-CM

## 2017-07-30 NOTE — Therapy (Signed)
[address] [place], [place], [address] Phone: [phone]   Fax:  [phone]  Physical Therapy Treatment  Patient Details  Name: Peter Roth MRN: [id number] Date of Birth: September 22, 1960 Referring Provider: Berenice Primas   Encounter Date: 07/30/2017  PT End of Session - 07/30/17 1039    Visit Number  11    Date for PT Re-Evaluation  08/26/17    PT Start Time  1010    PT Stop Time  1105    PT Time Calculation (min)  55 min       Past Medical History:  Diagnosis Date  . Arthritis   . Bipolar 1 disorder ([place])   . Diabetes mellitus without complication ([place])    type 2  . Headache    stress related  . High cholesterol   . Hypertension     Past Surgical History:  Procedure Laterality Date  . JOINT REPLACEMENT     Right knee Dr. Berenice Primas 06/07/17  . NO PAST SURGERIES    . TOTAL KNEE ARTHROPLASTY Right 06/07/2017   Procedure: RIGHT TOTAL KNEE ARTHROPLASTY, INJECTION LEFT KNEE WITH FLUID ASPIRATION;  Surgeon: Dorna Leitz, MD;  Location: [facility];  Service: Orthopedics;  Laterality: Right;    There were no vitals filed for this visit.  Subjective Assessment - 07/30/17 1016    Subjective  left knee getting drained today, trying to move surgery up    Currently in Pain?  Yes    Pain Score  5     Pain Location  Knee    Pain Orientation  Left                      [facility] Adult PT Treatment/Exercise - 07/30/17 0001      Knee/Hip Exercises: Aerobic   Recumbent Bike  6 min    Nustep  L 5 7 min - LE only       Knee/Hip Exercises: Machines for Strengthening   Cybex Knee Extension  10# 2 sets 10    Cybex Knee Flexion  25# 2 sets 10    Cybex Leg Press  40# 3 sets 10 2 set with TKE      Knee/Hip Exercises: Standing   Heel Raises  20 reps;Both      Vasopneumatic   Number Minutes Vasopneumatic   15 minutes    Vasopnuematic Location   Knee    Vasopneumatic Pressure  Medium    Vasopneumatic  Temperature   15      Manual Therapy   Manual Therapy  Passive ROM    Passive ROM  flex/ext               PT Short Term Goals - 06/27/17 1513      PT SHORT TERM GOAL #1   Title  assure independent with HEP    Baseline  HHPT ther ex    Status  Achieved        PT Long Term Goals - 07/26/17 0909      PT LONG TERM GOAL #1   Title  increase AROM to 5-114 degrees flexion    Status  Partially Met      PT LONG TERM GOAL #2   Title  walk without deviation all community distances    Status  Achieved      PT LONG TERM GOAL #3   Title  decrease pain 50%    Status  Achieved  PT LONG TERM GOAL #4   Title  go up and down stairs step over step    Status  Achieved            Plan - 07/30/17 1039    Clinical Impression Statement  increased left knee pain and swelling limiting tx today    PT Treatment/Interventions  ADLs/Self Care Home Management;Cryotherapy;Electrical Stimulation;Gait training;Neuromuscular re-education;Balance training;Therapeutic exercise;Therapeutic activities;Functional mobility training;Stair training;Patient/family education;Manual techniques;Vasopneumatic Device    PT Next Visit Plan  ROM/ strength/gait       Patient will benefit from skilled therapeutic intervention in order to improve the following deficits and impairments:  Abnormal gait, Decreased activity tolerance, Decreased balance, Decreased mobility, Decreased strength, Increased edema, Impaired flexibility, Pain, Cardiopulmonary status limiting activity, Decreased endurance, Decreased range of motion, Difficulty walking  Visit Diagnosis: Acute pain of right knee  Stiffness of right knee, not elsewhere classified  Difficulty in walking, not elsewhere classified  Localized edema     Problem List Patient Active Problem List   Diagnosis Date Noted  . Primary osteoarthritis of right knee 06/07/2017  . Primary osteoarthritis of left knee 06/07/2017  . Effusion, left knee  06/07/2017    [place] Elsbernd,ANGIE PTA 07/30/2017, 10:40 AM  [address] [place], [place], [address] Phone: [phone]   Fax:  [phone]  Name: RICHEY DOOLITTLE MRN: [id number] Date of Birth: 11-03-1960

## 2017-08-01 ENCOUNTER — Ambulatory Visit: Payer: Medicare Other | Admitting: Physical Therapy

## 2017-08-01 DIAGNOSIS — M25561 Pain in right knee: Secondary | ICD-10-CM | POA: Diagnosis not present

## 2017-08-01 DIAGNOSIS — R262 Difficulty in walking, not elsewhere classified: Secondary | ICD-10-CM

## 2017-08-01 DIAGNOSIS — M25661 Stiffness of right knee, not elsewhere classified: Secondary | ICD-10-CM

## 2017-08-01 DIAGNOSIS — R600 Localized edema: Secondary | ICD-10-CM

## 2017-08-01 NOTE — Therapy (Signed)
[address] [place], [place], [address] Phone: [phone]   Fax:  [phone]  Physical Therapy Treatment  Patient Details  Name: KREIG PARSON MRN: [id number] Date of Birth: 28-Jun-1961 Referring Provider: Berenice Primas   Encounter Date: 08/01/2017  PT End of Session - 08/01/17 1030    Visit Number  12    Date for PT Re-Evaluation  08/26/17    PT Start Time  1008    PT Stop Time  1110    PT Time Calculation (min)  62 min       Past Medical History:  Diagnosis Date  . Arthritis   . Bipolar 1 disorder ([place])   . Diabetes mellitus without complication ([place])    type 2  . Headache    stress related  . High cholesterol   . Hypertension     Past Surgical History:  Procedure Laterality Date  . JOINT REPLACEMENT     Right knee Dr. Berenice Primas 06/07/17  . NO PAST SURGERIES    . TOTAL KNEE ARTHROPLASTY Right 06/07/2017   Procedure: RIGHT TOTAL KNEE ARTHROPLASTY, INJECTION LEFT KNEE WITH FLUID ASPIRATION;  Surgeon: Dorna Leitz, MD;  Location: [facility];  Service: Orthopedics;  Laterality: Right;    There were no vitals filed for this visit.  Subjective Assessment - 08/01/17 1009    Subjective  left knee drained,fluid already back. Left knee gave out this morning and I fell. I think Rt knee is okay    Currently in Pain?  Yes    Pain Score  6     Pain Location  Knee    Pain Orientation  Left         [facility] PT Assessment - 08/01/17 0001      AROM   Right Knee Extension  2    Right Knee Flexion  107 fell this morning to some increased soreness                  [facility] Adult PT Treatment/Exercise - 08/01/17 0001      Knee/Hip Exercises: Aerobic   Recumbent Bike  6 min    Nustep  L 5 7 min - LE only       Knee/Hip Exercises: Machines for Strengthening   Cybex Knee Extension  10# 2 sets 10    Cybex Knee Flexion  25# 2 sets 10    Cybex Leg Press  40# 3 sets 10 2 sets 15 calf raises 40#      Knee/Hip Exercises: Standing   Lateral Step Up  15 reps;Hand Hold: 2;Step Height: 6";Both opp leg abd    Forward Step Up  15 reps;Hand Hold: 2;Step Height: 6";Both oopp leg ext    Walking with Sports Cord  backward to work TKE    Other Standing Knee Exercises  rocker board 20  cable press down for TKE 2 sets 15    Other Standing Knee Exercises  TKE green tband 2 sets 15      Vasopneumatic   Number Minutes Vasopneumatic   15 minutes    Vasopnuematic Location   Knee    Vasopneumatic Pressure  Medium    Vasopneumatic Temperature   15      Manual Therapy   Manual Therapy  Passive ROM    Passive ROM  flex/ext               PT Short Term Goals - 06/27/17 1513  PT SHORT TERM GOAL #1   Title  assure independent with HEP    Baseline  HHPT ther ex    Status  Achieved        PT Long Term Goals - 08/01/17 1023      PT LONG TERM GOAL #1   Title  increase AROM to 5-114 degrees flexion    Status  Partially Met      PT LONG TERM GOAL #2   Title  walk without deviation all community distances    Status  Achieved      PT LONG TERM GOAL #3   Title  decrease pain 50%    Status  Achieved      PT LONG TERM GOAL #4   Title  go up and down stairs step over step    Status  Achieved            Plan - 08/01/17 1031    Clinical Impression Statement  pt tolerated tx well, minimal increased soreness from fall earlir this morning ( slight decrease in flexion d/t soreness) continuing to maximize strength and ROM in preparation for Left TKR 2/15    PT Treatment/Interventions  ADLs/Self Care Home Management;Cryotherapy;Electrical Stimulation;Gait training;Neuromuscular re-education;Balance training;Therapeutic exercise;Therapeutic activities;Functional mobility training;Stair training;Patient/family education;Manual techniques;Vasopneumatic Device    PT Next Visit Plan  ROM/ strength/gait       Patient will benefit from skilled therapeutic intervention in order to improve the  following deficits and impairments:  Abnormal gait, Decreased activity tolerance, Decreased balance, Decreased mobility, Decreased strength, Increased edema, Impaired flexibility, Pain, Cardiopulmonary status limiting activity, Decreased endurance, Decreased range of motion, Difficulty walking  Visit Diagnosis: Acute pain of right knee  Stiffness of right knee, not elsewhere classified  Difficulty in walking, not elsewhere classified  Localized edema     Problem List Patient Active Problem List   Diagnosis Date Noted  . Primary osteoarthritis of right knee 06/07/2017  . Primary osteoarthritis of left knee 06/07/2017  . Effusion, left knee 06/07/2017    Hildreth Orsak,ANGIE  PTA 08/01/2017, 10:33 AM  [address] [place], [place], [address] Phone: [phone]   Fax:  [phone]  Name: DAWON TROOP MRN: [id number] Date of Birth: 1960-08-13

## 2017-08-06 ENCOUNTER — Ambulatory Visit: Payer: Medicare Other | Admitting: Physical Therapy

## 2017-08-06 DIAGNOSIS — M25561 Pain in right knee: Secondary | ICD-10-CM

## 2017-08-06 DIAGNOSIS — R600 Localized edema: Secondary | ICD-10-CM

## 2017-08-06 DIAGNOSIS — M25661 Stiffness of right knee, not elsewhere classified: Secondary | ICD-10-CM

## 2017-08-06 DIAGNOSIS — R262 Difficulty in walking, not elsewhere classified: Secondary | ICD-10-CM

## 2017-08-06 NOTE — Therapy (Signed)
[address] [place], [place], [address] Phone: [phone]   Fax:  [phone]  Physical Therapy Treatment  Patient Details  Name: Peter Roth MRN: [id number] Date of Birth: 1960-08-27 Referring Provider: Berenice Primas   Encounter Date: 08/06/2017  PT End of Session - 08/06/17 0837    Visit Number  13    Date for PT Re-Evaluation  08/26/17    PT Start Time  0800    PT Stop Time  0900    PT Time Calculation (min)  60 min       Past Medical History:  Diagnosis Date  . Arthritis   . Bipolar 1 disorder ([place])   . Diabetes mellitus without complication ([place])    type 2  . Headache    stress related  . High cholesterol   . Hypertension     Past Surgical History:  Procedure Laterality Date  . JOINT REPLACEMENT     Right knee Dr. Berenice Primas 06/07/17  . NO PAST SURGERIES    . TOTAL KNEE ARTHROPLASTY Right 06/07/2017   Procedure: RIGHT TOTAL KNEE ARTHROPLASTY, INJECTION LEFT KNEE WITH FLUID ASPIRATION;  Surgeon: Dorna Leitz, MD;  Location: [facility];  Service: Orthopedics;  Laterality: Right;    There were no vitals filed for this visit.  Subjective Assessment - 08/06/17 0817    Subjective  left knee still swelling- just ready for get replaced    Currently in Pain?  Yes    Pain Score  5     Pain Location  Knee                      [facility] Adult PT Treatment/Exercise - 08/06/17 0001      Knee/Hip Exercises: Aerobic   Recumbent Bike  6 min    Nustep  L 5 8 min - LE only       Knee/Hip Exercises: Machines for Strengthening   Cybex Knee Extension  10# 3 sets 10    Cybex Knee Flexion  25# 3 sets 10    Cybex Leg Press  40# 3 sets 10      Knee/Hip Exercises: Standing   Heel Raises  20 reps;Both toe raises    Terminal Knee Extension  Strengthening;2 sets;15 reps;Theraband green    Lateral Step Up  15 reps;Hand Hold: 2;Step Height: 6";Both    Forward Step Up  15 reps;Hand Hold: 2;Step  Height: 6";Both      Vasopneumatic   Number Minutes Vasopneumatic   15 minutes    Vasopnuematic Location   Knee    Vasopneumatic Pressure  Medium    Vasopneumatic Temperature   15      Manual Therapy   Manual Therapy  Passive ROM    Passive ROM  flex/ext               PT Short Term Goals - 06/27/17 1513      PT SHORT TERM GOAL #1   Title  assure independent with HEP    Baseline  HHPT ther ex    Status  Achieved        PT Long Term Goals - 08/01/17 1023      PT LONG TERM GOAL #1   Title  increase AROM to 5-114 degrees flexion    Status  Partially Met      PT LONG TERM GOAL #2   Title  walk without deviation all community distances  Status  Achieved      PT LONG TERM GOAL #3   Title  decrease pain 50%    Status  Achieved      PT LONG TERM GOAL #4   Title  go up and down stairs step over step    Status  Achieved            Plan - 08/06/17 0837    Clinical Impression Statement  pt tolerated ther ex well, continues to have swelling and increased pain left,non surgical knee. increased ROM and quad strength    PT Treatment/Interventions  ADLs/Self Care Home Management;Cryotherapy;Electrical Stimulation;Gait training;Neuromuscular re-education;Balance training;Therapeutic exercise;Therapeutic activities;Functional mobility training;Stair training;Patient/family education;Manual techniques;Vasopneumatic Device    PT Next Visit Plan  ROM/ strength/gait       Patient will benefit from skilled therapeutic intervention in order to improve the following deficits and impairments:  Abnormal gait, Decreased activity tolerance, Decreased balance, Decreased mobility, Decreased strength, Increased edema, Impaired flexibility, Pain, Cardiopulmonary status limiting activity, Decreased endurance, Decreased range of motion, Difficulty walking  Visit Diagnosis: Acute pain of right knee  Stiffness of right knee, not elsewhere classified  Difficulty in walking, not  elsewhere classified  Localized edema     Problem List Patient Active Problem List   Diagnosis Date Noted  . Primary osteoarthritis of right knee 06/07/2017  . Primary osteoarthritis of left knee 06/07/2017  . Effusion, left knee 06/07/2017    Jaelyne Deeg,ANGIE PTA 08/06/2017, 8:44 AM  [address] [place], [place], [address] Phone: [phone]   Fax:  [phone]  Name: Peter Roth MRN: [id number] Date of Birth: 31-Dec-1960

## 2017-08-08 ENCOUNTER — Ambulatory Visit: Payer: Medicare Other | Admitting: Physical Therapy

## 2017-08-08 DIAGNOSIS — R600 Localized edema: Secondary | ICD-10-CM

## 2017-08-08 DIAGNOSIS — M25561 Pain in right knee: Secondary | ICD-10-CM | POA: Diagnosis not present

## 2017-08-08 DIAGNOSIS — R262 Difficulty in walking, not elsewhere classified: Secondary | ICD-10-CM

## 2017-08-08 DIAGNOSIS — M25661 Stiffness of right knee, not elsewhere classified: Secondary | ICD-10-CM

## 2017-08-08 NOTE — Therapy (Signed)
[address] [place], [place], [address] Phone: [phone]   Fax:  [phone]  Physical Therapy Treatment  Patient Details  Name: MICHOLAS DRUMWRIGHT MRN: [id number] Date of Birth: 08-08-1960 Referring Provider: Berenice Primas   Encounter Date: 08/08/2017  PT End of Session - 08/08/17 0927    Visit Number  14    Date for PT Re-Evaluation  08/26/17    PT Start Time  0845    PT Stop Time  0945    PT Time Calculation (min)  60 min       Past Medical History:  Diagnosis Date  . Arthritis   . Bipolar 1 disorder ([place])   . Diabetes mellitus without complication ([place])    type 2  . Headache    stress related  . High cholesterol   . Hypertension     Past Surgical History:  Procedure Laterality Date  . JOINT REPLACEMENT     Right knee Dr. Berenice Primas 06/07/17  . NO PAST SURGERIES    . TOTAL KNEE ARTHROPLASTY Right 06/07/2017   Procedure: RIGHT TOTAL KNEE ARTHROPLASTY, INJECTION LEFT KNEE WITH FLUID ASPIRATION;  Surgeon: Dorna Leitz, MD;  Location[facility];  Service: Orthopedics;  Laterality: Right;    There were no vitals filed for this visit.  Subjective Assessment - 08/08/17 0852    Subjective  knees are doing okay this morning    Currently in Pain?  Yes    Pain Score  4     Pain Location  Knee         [facility] PT Assessment - 08/08/17 0001      AROM   Right Knee Flexion  110                  [facility] Adult PT Treatment/Exercise - 08/08/17 0001      Knee/Hip Exercises: Aerobic   Recumbent Bike  7 min    Nustep  L 5 8 min - LE only       Knee/Hip Exercises: Machines for Strengthening   Cybex Knee Extension  10# 3 sets 10    Cybex Knee Flexion  25# 3 sets 10    Cybex Leg Press  40# 3 sets 10 calf raises 40# 3 sets 10      Knee/Hip Exercises: Standing   Terminal Knee Extension  Strengthening;2 sets;15 reps;Theraband    Lateral Step Up  15 reps;Hand Hold: 2;Step Height: 6";Both    Forward  Step Up  15 reps;Hand Hold: 2;Step Height: 6";Both    Walking with Sports Cord  backward to work TKE    Other Standing Knee Exercises  rocker board 20  airex heel/toe 20    Other Standing Knee Exercises  TKE green tband 2 sets 15      Vasopneumatic   Number Minutes Vasopneumatic   15 minutes    Vasopnuematic Location   Knee    Vasopneumatic Pressure  Medium    Vasopneumatic Temperature   15               PT Short Term Goals - 06/27/17 1513      PT SHORT TERM GOAL #1   Title  assure independent with HEP    Baseline  HHPT ther ex    Status  Achieved        PT Long Term Goals - 08/08/17 0927      PT LONG TERM GOAL #1   Title  increase  AROM to 5-114 degrees flexion    Status  Partially Met      PT LONG TERM GOAL #2   Title  walk without deviation all community distances    Status  Achieved      PT LONG TERM GOAL #3   Title  decrease pain 50%    Status  Achieved      PT LONG TERM GOAL #4   Title  go up and down stairs step over step    Status  Achieved            Plan - 08/08/17 0928    Clinical Impression Statement  continuing to work on ROM and strength to prepare for 2nd knee replacement 2/15. Goals met except ROM       Patient will benefit from skilled therapeutic intervention in order to improve the following deficits and impairments:     Visit Diagnosis: Acute pain of right knee  Stiffness of right knee, not elsewhere classified  Difficulty in walking, not elsewhere classified  Localized edema     Problem List Patient Active Problem List   Diagnosis Date Noted  . Primary osteoarthritis of right knee 06/07/2017  . Primary osteoarthritis of left knee 06/07/2017  . Effusion, left knee 06/07/2017    Lorell Thibodaux,ANGIE PTA 08/08/2017, 9:29 AM  [address] [place], [place], [address] Phone: [phone]   Fax:  [phone]  Name: EILAM SHREWSBURY MRN:  [id number] Date of Birth: 1960-10-02

## 2017-08-13 ENCOUNTER — Encounter: Payer: Self-pay | Admitting: Physical Therapy

## 2017-08-13 ENCOUNTER — Ambulatory Visit: Payer: Medicare Other | Attending: Orthopedic Surgery | Admitting: Physical Therapy

## 2017-08-13 DIAGNOSIS — M25561 Pain in right knee: Secondary | ICD-10-CM | POA: Diagnosis not present

## 2017-08-13 DIAGNOSIS — M25661 Stiffness of right knee, not elsewhere classified: Secondary | ICD-10-CM | POA: Diagnosis present

## 2017-08-13 DIAGNOSIS — R262 Difficulty in walking, not elsewhere classified: Secondary | ICD-10-CM | POA: Insufficient documentation

## 2017-08-13 DIAGNOSIS — R600 Localized edema: Secondary | ICD-10-CM | POA: Diagnosis present

## 2017-08-13 NOTE — Therapy (Signed)
[address] [place], [place], [address] Phone: [phone]   Fax:  [phone]  Physical Therapy Treatment  Patient Details  Name: Peter Roth MRN: [id number] Date of Birth: June 21, 1961 Referring Provider: Berenice Primas   Encounter Date: 08/13/2017  PT End of Session - 08/13/17 0939    Visit Number  15    Date for PT Re-Evaluation  08/26/17    PT Start Time  0900    PT Stop Time  1000    PT Time Calculation (min)  60 min       Past Medical History:  Diagnosis Date  . Arthritis   . Bipolar 1 disorder ([place])   . Diabetes mellitus without complication ([place])    type 2  . Headache    stress related  . High cholesterol   . Hypertension     Past Surgical History:  Procedure Laterality Date  . JOINT REPLACEMENT     Right knee Dr. Berenice Primas 06/07/17  . NO PAST SURGERIES    . TOTAL KNEE ARTHROPLASTY Right 06/07/2017   Procedure: RIGHT TOTAL KNEE ARTHROPLASTY, INJECTION LEFT KNEE WITH FLUID ASPIRATION;  Surgeon: Dorna Leitz, MD;  Location[facility];  Service: Orthopedics;  Laterality: Right;    There were no vitals filed for this visit.  Subjective Assessment - 08/13/17 0901    Subjective  left one gave me a fit last night    Currently in Pain?  Yes    Pain Score  4     Pain Location  Knee    Pain Orientation  Left         [facility] PT Assessment - 08/13/17 0001      AROM   AROM Assessment Site  Knee    Right/Left Knee  Right    Right Knee Extension  2    Right Knee Flexion  112 AROM LEFT KNEE pre surgery 0-123                  [facility] Adult PT Treatment/Exercise - 08/13/17 0001      Knee/Hip Exercises: Aerobic   Recumbent Bike  7 min    Nustep  L 5 8 min - LE only       Knee/Hip Exercises: Machines for Strengthening   Cybex Knee Extension  10# 3 sets 10    Cybex Knee Flexion  25# 3 sets 10    Cybex Leg Press  40# 3 sets 10      Knee/Hip Exercises: Standing   Heel Raises  Both;20  reps;3 seconds toe raises 20    Hip Flexion  Stengthening;Both;20 reps;Knee bent;2 sets blue tband    Functional Squat  20 reps blue tband    Walking with Sports Cord  backward to work TKE fwd with step up      Vasopneumatic   Number Minutes Vasopneumatic   15 minutes    Vasopnuematic Location   Knee    Vasopneumatic Pressure  Medium    Vasopneumatic Temperature   15               PT Short Term Goals - 06/27/17 1513      PT SHORT TERM GOAL #1   Title  assure independent with HEP    Baseline  HHPT ther ex    Status  Achieved        PT Long Term Goals - 08/08/17 0927      PT LONG TERM GOAL #1  Title  increase AROM to 5-114 degrees flexion    Status  Partially Met      PT LONG TERM GOAL #2   Title  walk without deviation all community distances    Status  Achieved      PT LONG TERM GOAL #3   Title  decrease pain 50%    Status  Achieved      PT LONG TERM GOAL #4   Title  go up and down stairs step over step    Status  Achieved            Plan - 08/13/17 0939    Clinical Impression Statement  increased AROM, still minimal ext deficiets d/t end range quad weakness.    PT Treatment/Interventions  ADLs/Self Care Home Management;Cryotherapy;Electrical Stimulation;Gait training;Neuromuscular re-education;Balance training;Therapeutic exercise;Therapeutic activities;Functional mobility training;Stair training;Patient/family education;Manual techniques;Vasopneumatic Device    PT Next Visit Plan  D/C in preparation for TKR in left 2/15       Patient will benefit from skilled therapeutic intervention in order to improve the following deficits and impairments:  Abnormal gait, Decreased activity tolerance, Decreased balance, Decreased mobility, Decreased strength, Increased edema, Impaired flexibility, Pain, Cardiopulmonary status limiting activity, Decreased endurance, Decreased range of motion, Difficulty walking  Visit Diagnosis: Acute pain of right  knee  Stiffness of right knee, not elsewhere classified  Difficulty in walking, not elsewhere classified  Localized edema     Problem List Patient Active Problem List   Diagnosis Date Noted  . Primary osteoarthritis of right knee 06/07/2017  . Primary osteoarthritis of left knee 06/07/2017  . Effusion, left knee 06/07/2017    PAYSEUR,ANGIE PTA 08/13/2017, 9:41 AM  [address] [place], [place], [address] Phone: [phone]   Fax:  [phone]  Name: Peter Roth MRN: [id number] Date of Birth: 23-Mar-1961

## 2017-08-15 ENCOUNTER — Encounter: Payer: Self-pay | Admitting: Physical Therapy

## 2017-08-15 ENCOUNTER — Ambulatory Visit: Payer: Medicare Other | Admitting: Physical Therapy

## 2017-08-15 DIAGNOSIS — R600 Localized edema: Secondary | ICD-10-CM

## 2017-08-15 DIAGNOSIS — M25661 Stiffness of right knee, not elsewhere classified: Secondary | ICD-10-CM

## 2017-08-15 DIAGNOSIS — M25561 Pain in right knee: Secondary | ICD-10-CM | POA: Diagnosis not present

## 2017-08-15 DIAGNOSIS — R262 Difficulty in walking, not elsewhere classified: Secondary | ICD-10-CM

## 2017-08-15 NOTE — Patient Instructions (Signed)
Peter SchlatterChester K Roth  08/15/2017   Your procedure is scheduled on: 08/23/2017   [facility]  Report to admitting at   0745 AM   Call this number if you have problems the morning of surgery [phone]   Remember: Do not eat food or drink liquids :After Midnight.     Take these medicines the morning of surgery with A SIP OF WATER: Prilosec if needed  DO NOT TAKE ANY DIABETIC MEDICATIONS DAY OF YOUR SURGERY                               You may not have any metal on your body including hair pins and              piercings  Do not wear jewelry,  lotions, powders or perfumes, deodorant                        Men may shave face and neck.   Do not bring valuables to the hospital. [facility] IS NOT             RESPONSIBLE   FOR VALUABLES.  Contacts, dentures or bridgework may not be worn into surgery.  Leave suitcase in the car. After surgery it may be brought to your room.                     Please read over the following fact sheets you were given: _____________________________________________________________________             [facility] - Preparing for Surgery Before surgery, you can play an important role.  Because skin is not sterile, your skin needs to be as free of germs as possible.  You can reduce the number of germs on your skin by washing with CHG (chlorahexidine gluconate) soap before surgery.  CHG is an antiseptic cleaner which kills germs and bonds with the skin to continue killing germs even after washing. Please DO NOT use if you have an allergy to CHG or antibacterial soaps.  If your skin becomes reddened/irritated stop using the CHG and inform your nurse when you arrive at Short Stay. Do not shave (including legs and underarms) for at least 48 hours prior to the first CHG shower.  You may shave your face/neck. Please follow these instructions carefully:  1.  Shower with CHG Soap the night before surgery and the   morning of Surgery.  2.  If you choose to wash your hair, wash your hair first as usual with your  normal  shampoo.  3.  After you shampoo, rinse your hair and body thoroughly to remove the  shampoo.                           4.  Use CHG as you would any other liquid soap.  You can apply chg directly  to the skin and wash                       Gently with a scrungie or clean washcloth.  5.  Apply the CHG Soap to your body ONLY FROM THE NECK DOWN.   Do not use on face/ open  Wound or open sores. Avoid contact with eyes, ears mouth and genitals (private parts).                       Wash face,  Genitals (private parts) with your normal soap.             6.  Wash thoroughly, paying special attention to the area where your surgery  will be performed.  7.  Thoroughly rinse your body with warm water from the neck down.  8.  DO NOT shower/wash with your normal soap after using and rinsing off  the CHG Soap.                9.  Pat yourself dry with a clean towel.            10.  Wear clean pajamas.            11.  Place clean sheets on your bed the night of your first shower and do not  sleep with pets. Day of Surgery : Do not apply any lotions/deodorants the morning of surgery.  Please wear clean clothes to the hospital/surgery center.  FAILURE TO FOLLOW THESE INSTRUCTIONS MAY RESULT IN THE CANCELLATION OF YOUR SURGERY PATIENT SIGNATURE_________________________________  NURSE SIGNATURE__________________________________  ________________________________________________________________________  WHAT IS A BLOOD TRANSFUSION? Blood Transfusion Information  A transfusion is the replacement of blood or some of its parts. Blood is made up of multiple cells which provide different functions.  Red blood cells carry oxygen and are used for blood loss replacement.  White blood cells fight against infection.  Platelets control bleeding.  Plasma helps clot blood.  Other blood  products are available for specialized needs, such as hemophilia or other clotting disorders. BEFORE THE TRANSFUSION  Who gives blood for transfusions?   Healthy volunteers who are fully evaluated to make sure their blood is safe. This is blood bank blood. Transfusion therapy is the safest it has ever been in the practice of medicine. Before blood is taken from a donor, a complete history is taken to make sure that person has no history of diseases nor engages in risky social behavior (examples are intravenous drug use or sexual activity with multiple partners). The donor's travel history is screened to minimize risk of transmitting infections, such as malaria. The donated blood is tested for signs of infectious diseases, such as HIV and hepatitis. The blood is then tested to be sure it is compatible with you in order to minimize the chance of a transfusion reaction. If you or a relative donates blood, this is often done in anticipation of surgery and is not appropriate for emergency situations. It takes many days to process the donated blood. RISKS AND COMPLICATIONS Although transfusion therapy is very safe and saves many lives, the main dangers of transfusion include:   Getting an infectious disease.  Developing a transfusion reaction. This is an allergic reaction to something in the blood you were given. Every precaution is taken to prevent this. The decision to have a blood transfusion has been considered carefully by your caregiver before blood is given. Blood is not given unless the benefits outweigh the risks. AFTER THE TRANSFUSION  Right after receiving a blood transfusion, you will usually feel much better and more energetic. This is especially true if your red blood cells have gotten low (anemic). The transfusion raises the level of the red blood cells which carry oxygen, and this usually causes an energy increase.  The  nurse administering the transfusion will monitor you carefully for  complications. HOME CARE INSTRUCTIONS  No special instructions are needed after a transfusion. You may find your energy is better. Speak with your caregiver about any limitations on activity for underlying diseases you may have. SEEK MEDICAL CARE IF:   Your condition is not improving after your transfusion.  You develop redness or irritation at the intravenous (IV) site. SEEK IMMEDIATE MEDICAL CARE IF:  Any of the following symptoms occur over the next 12 hours:  Shaking chills.  You have a temperature by mouth above 102 F (38.9 C), not controlled by medicine.  Chest, back, or muscle pain.  People around you feel you are not acting correctly or are confused.  Shortness of breath or difficulty breathing.  Dizziness and fainting.  You get a rash or develop hives.  You have a decrease in urine output.  Your urine turns a dark color or changes to pink, red, or brown. Any of the following symptoms occur over the next 10 days:  You have a temperature by mouth above 102 F (38.9 C), not controlled by medicine.  Shortness of breath.  Weakness after normal activity.  The white part of the eye turns yellow (jaundice).  You have a decrease in the amount of urine or are urinating less often.  Your urine turns a dark color or changes to pink, red, or brown. Document Released: 06/22/2000 Document Revised: 09/17/2011 Document Reviewed: 02/09/2008 ExitCare Patient Information 2014 [place].  _______________________________________________________________________  Incentive Spirometer  An incentive spirometer is a tool that can help keep your lungs clear and active. This tool measures how well you are filling your lungs with each breath. Taking long deep breaths may help reverse or decrease the chance of developing breathing (pulmonary) problems (especially infection) following:  A long period of time when you are unable to move or be active. BEFORE THE PROCEDURE   If  the spirometer includes an indicator to show your best effort, your nurse or respiratory therapist will set it to a desired goal.  If possible, sit up straight or lean slightly forward. Try not to slouch.  Hold the incentive spirometer in an upright position. INSTRUCTIONS FOR USE  1. Sit on the edge of your bed if possible, or sit up as far as you can in bed or on a chair. 2. Hold the incentive spirometer in an upright position. 3. Breathe out normally. 4. Place the mouthpiece in your mouth and seal your lips tightly around it. 5. Breathe in slowly and as deeply as possible, raising the piston or the ball toward the top of the column. 6. Hold your breath for 3-5 seconds or for as long as possible. Allow the piston or ball to fall to the bottom of the column. 7. Remove the mouthpiece from your mouth and breathe out normally. 8. Rest for a few seconds and repeat Steps 1 through 7 at least 10 times every 1-2 hours when you are awake. Take your time and take a few normal breaths between deep breaths. 9. The spirometer may include an indicator to show your best effort. Use the indicator as a goal to work toward during each repetition. 10. After each set of 10 deep breaths, practice coughing to be sure your lungs are clear. If you have an incision (the cut made at the time of surgery), support your incision when coughing by placing a pillow or rolled up towels firmly against it. Once you are able to get  out of bed, walk around indoors and cough well. You may stop using the incentive spirometer when instructed by your caregiver.  RISKS AND COMPLICATIONS  Take your time so you do not get dizzy or light-headed.  If you are in pain, you may need to take or ask for pain medication before doing incentive spirometry. It is harder to take a deep breath if you are having pain. AFTER USE  Rest and breathe slowly and easily.  It can be helpful to keep track of a log of your progress. Your caregiver can  provide you with a simple table to help with this. If you are using the spirometer at home, follow these instructions: [place] IF:   You are having difficultly using the spirometer.  You have trouble using the spirometer as often as instructed.  Your pain medication is not giving enough relief while using the spirometer.  You develop fever of 100.5 F (38.1 C) or higher. SEEK IMMEDIATE MEDICAL CARE IF:   You cough up bloody sputum that had not been present before.  You develop fever of 102 F (38.9 C) or greater.  You develop worsening pain at or near the incision site. MAKE SURE YOU:   Understand these instructions.  Will watch your condition.  Will get help right away if you are not doing well or get worse. Document Released: 11/05/2006 Document Revised: 09/17/2011 Document Reviewed: 01/06/2007 [facility] Patient Information 2014 [place], [place].   ________________________________________________________________________

## 2017-08-15 NOTE — Therapy (Signed)
[address] [place], [place], [address] Phone: [phone]   Fax:  [phone]  Physical Therapy Treatment  Patient Details  Name: Peter Roth MRN: [id number] Date of Birth: [birth year] Referring Provider: Berenice Primas   Encounter Date: 08/15/2017  PT End of Session - 08/15/17 0930    Visit Number  16    Date for PT Re-Evaluation  08/26/17    PT Start Time  0852    PT Stop Time  0945    PT Time Calculation (min)  53 min       Past Medical History:  Diagnosis Date  . Arthritis   . Bipolar 1 disorder ([place])   . Diabetes mellitus without complication ([place])    type 2  . Headache    stress related  . High cholesterol   . Hypertension     Past Surgical History:  Procedure Laterality Date  . JOINT REPLACEMENT     Right knee Dr. Berenice Primas 06/07/17  . NO PAST SURGERIES    . TOTAL KNEE ARTHROPLASTY Right 06/07/2017   Procedure: RIGHT TOTAL KNEE ARTHROPLASTY, INJECTION LEFT KNEE WITH FLUID ASPIRATION;  Surgeon: Dorna Leitz, MD;  Location: [facility];  Service: Orthopedics;  Laterality: Right;    There were no vitals filed for this visit.  Subjective Assessment - 08/15/17 0902    Subjective  both knees hurting today    Currently in Pain?  Yes    Pain Score  6                       [facility] Adult PT Treatment/Exercise - 08/15/17 0001      Knee/Hip Exercises: Aerobic   Recumbent Bike  7 min    Nustep  L 5 8 min - LE only       Knee/Hip Exercises: Machines for Strengthening   Cybex Knee Extension  10# 3 sets 10    Cybex Knee Flexion  25# 3 sets 10    Cybex Leg Press  40# 3 sets 10      Knee/Hip Exercises: Standing   Heel Raises  Both;20 reps;3 seconds toe raises 20      Vasopneumatic   Number Minutes Vasopneumatic   15 minutes    Vasopnuematic Location   Knee    Vasopneumatic Pressure  Medium    Vasopneumatic Temperature   15               PT Short Term Goals - 06/27/17  1513      PT SHORT TERM GOAL #1   Title  assure independent with HEP    Baseline  HHPT ther ex    Status  Achieved        PT Long Term Goals - 08/15/17 0930      PT LONG TERM GOAL #1   Title  increase AROM to 5-114 degrees flexion    Status  Achieved      PT LONG TERM GOAL #2   Title  walk without deviation all community distances    Status  Achieved      PT LONG TERM GOAL #3   Title  decrease pain 50%    Status  Achieved      PT LONG TERM GOAL #4   Title  go up and down stairs step over step    Status  Achieved            Plan - 08/15/17 0354  Clinical Impression Statement  all goals met. D/C with HEP. Left TKR 08/23/17    PT Treatment/Interventions  ADLs/Self Care Home Management;Cryotherapy;Electrical Stimulation;Gait training;Neuromuscular re-education;Balance training;Therapeutic exercise;Therapeutic activities;Functional mobility training;Stair training;Patient/family education;Manual techniques;Vasopneumatic Device    PT Next Visit Plan  D/C in preparation for TKR in left 2/15       Patient will benefit from skilled therapeutic intervention in order to improve the following deficits and impairments:  Abnormal gait, Decreased activity tolerance, Decreased balance, Decreased mobility, Decreased strength, Increased edema, Impaired flexibility, Pain, Cardiopulmonary status limiting activity, Decreased endurance, Decreased range of motion, Difficulty walking  Visit Diagnosis: Acute pain of right knee  Stiffness of right knee, not elsewhere classified  Difficulty in walking, not elsewhere classified  Localized edema     Problem List Patient Active Problem List   Diagnosis Date Noted  . Primary osteoarthritis of right knee 06/07/2017  . Primary osteoarthritis of left knee 06/07/2017  . Effusion, left knee 06/07/2017    Torie Priebe,ANGIE PTA 08/15/2017, 9:32 AM  [address]  [place], [place], [address] Phone: [phone]   Fax:  [phone]  Name: ALEEM ELZA MRN: [id number] Date of Birth: [birth year]

## 2017-08-16 ENCOUNTER — Ambulatory Visit (HOSPITAL_COMMUNITY)
Admission: RE | Admit: 2017-08-16 | Discharge: 2017-08-16 | Disposition: A | Discharge status: Home / Self Care | Payer: Medicare Other | Source: Ambulatory Visit | Attending: Orthopedic Surgery | Admitting: Orthopedic Surgery

## 2017-08-16 ENCOUNTER — Other Ambulatory Visit: Payer: Self-pay

## 2017-08-16 ENCOUNTER — Encounter (HOSPITAL_COMMUNITY): Payer: Self-pay

## 2017-08-16 ENCOUNTER — Encounter (HOSPITAL_COMMUNITY)
Admission: RE | Admit: 2017-08-16 | Discharge: 2017-08-16 | Disposition: A | Discharge status: Home / Self Care | Payer: Medicare Other | Source: Ambulatory Visit | Attending: Orthopedic Surgery | Admitting: Orthopedic Surgery

## 2017-08-16 DIAGNOSIS — Z01810 Encounter for preprocedural cardiovascular examination: Secondary | ICD-10-CM | POA: Insufficient documentation

## 2017-08-16 DIAGNOSIS — Z01812 Encounter for preprocedural laboratory examination: Secondary | ICD-10-CM | POA: Diagnosis present

## 2017-08-16 DIAGNOSIS — Z01818 Encounter for other preprocedural examination: Secondary | ICD-10-CM | POA: Diagnosis present

## 2017-08-16 DIAGNOSIS — M1712 Unilateral primary osteoarthritis, left knee: Secondary | ICD-10-CM | POA: Diagnosis not present

## 2017-08-16 HISTORY — DX: Type 2 diabetes mellitus without complications: E11.9

## 2017-08-16 HISTORY — DX: Pain disorder exclusively related to psychological factors: F45.41

## 2017-08-16 HISTORY — DX: Presence of spectacles and contact lenses: Z97.3

## 2017-08-16 HISTORY — DX: Hyperlipidemia, unspecified: E78.5

## 2017-08-16 LAB — SURGICAL PCR SCREEN
MRSA, PCR: NEGATIVE
STAPHYLOCOCCUS AUREUS: NEGATIVE

## 2017-08-16 LAB — URINALYSIS, ROUTINE W REFLEX MICROSCOPIC
Bacteria, UA: NONE SEEN
Bilirubin Urine: NEGATIVE
Glucose, UA: NEGATIVE mg/dL
Hgb urine dipstick: NEGATIVE
Ketones, ur: 5 mg/dL — AB
LEUKOCYTES UA: NEGATIVE
Nitrite: NEGATIVE
PH: 5 (ref 5.0–8.0)
Protein, ur: 30 mg/dL — AB
SPECIFIC GRAVITY, URINE: 1.026 (ref 1.005–1.030)
Squamous Epithelial / LPF: NONE SEEN

## 2017-08-16 LAB — COMPREHENSIVE METABOLIC PANEL
ALBUMIN: 3.3 g/dL — AB (ref 3.5–5.0)
ALK PHOS: 69 U/L (ref 38–126)
ALT: 11 U/L — AB (ref 17–63)
AST: 14 U/L — ABNORMAL LOW (ref 15–41)
Anion gap: 7 (ref 5–15)
BUN: 13 mg/dL (ref 6–20)
CALCIUM: 8.8 mg/dL — AB (ref 8.9–10.3)
CO2: 26 mmol/L (ref 22–32)
CREATININE: 0.73 mg/dL (ref 0.61–1.24)
Chloride: 107 mmol/L (ref 101–111)
GFR calc Af Amer: >60 mL/min (ref >60)
GFR calc non Af Amer: >60 mL/min (ref >60)
GLUCOSE: 106 mg/dL — AB (ref 65–99)
Potassium: 4.3 mmol/L (ref 3.5–5.1)
Sodium: 140 mmol/L (ref 135–145)
Total Bilirubin: 0.3 mg/dL (ref 0.3–1.2)
Total Protein: 6.7 g/dL (ref 6.5–8.1)

## 2017-08-16 LAB — HEMOGLOBIN A1C
Hgb A1c MFr Bld: 5.8 % — ABNORMAL HIGH (ref 4.8–5.6)
MEAN PLASMA GLUCOSE: 119.76 mg/dL

## 2017-08-16 LAB — CBC WITH DIFFERENTIAL/PLATELET
BASOS PCT: 0 %
Basophils Absolute: 0 10*3/uL (ref 0.0–0.1)
EOS ABS: 0.2 10*3/uL (ref 0.0–0.7)
Eosinophils Relative: 3 %
HCT: 35.2 % — ABNORMAL LOW (ref 39.0–52.0)
Hemoglobin: 11.1 g/dL — ABNORMAL LOW (ref 13.0–17.0)
Lymphocytes Relative: 38 %
Lymphs Abs: 2.4 10*3/uL (ref 0.7–4.0)
MCH: 25.9 pg — ABNORMAL LOW (ref 26.0–34.0)
MCHC: 31.5 g/dL (ref 30.0–36.0)
MCV: 82.2 fL (ref 78.0–100.0)
Monocytes Absolute: 0.4 10*3/uL (ref 0.1–1.0)
Monocytes Relative: 6 %
NEUTROS PCT: 53 %
Neutro Abs: 3.3 10*3/uL (ref 1.7–7.7)
Platelets: 261 10*3/uL (ref 150–400)
RBC: 4.28 MIL/uL (ref 4.22–5.81)
RDW: 14.4 % (ref 11.5–15.5)
WBC: 6.2 10*3/uL (ref 4.0–10.5)

## 2017-08-16 LAB — GLUCOSE, CAPILLARY: GLUCOSE-CAPILLARY: 121 mg/dL — AB (ref 65–99)

## 2017-08-16 LAB — PROTIME-INR
INR: 1.05
Prothrombin Time: 13.6 seconds (ref 11.4–15.2)

## 2017-08-16 LAB — APTT: aPTT: 33 seconds (ref 24–36)

## 2017-08-16 NOTE — Progress Notes (Signed)
Routed UA results dated 08-16-2017 to Dr Jodi GeraldsJohn Graves in epic.

## 2017-08-22 NOTE — Anesthesia Preprocedure Evaluation (Signed)
Anesthesia Evaluation  Patient identified by MRN, date of birth, ID band Patient awake    Reviewed: Allergy & Precautions, NPO status , Patient's Chart, lab work & pertinent test results  History of Anesthesia Complications Negative for: history of anesthetic complications  Airway Mallampati: II  TM Distance: >3 FB Neck ROM: Full    Dental  (+) Teeth Intact, Missing,    Pulmonary neg shortness of breath, neg sleep apnea, neg COPD, neg recent URI, Current Smoker,    breath sounds clear to auscultation       Cardiovascular hypertension,  Rhythm:Regular     Neuro/Psych  Headaches, PSYCHIATRIC DISORDERS Bipolar Disorder    GI/Hepatic negative GI ROS, Neg liver ROS,   Endo/Other  diabetes, Oral Hypoglycemic Agents  Renal/GU negative Renal ROS     Musculoskeletal  (+) Arthritis ,   Abdominal   Peds  Hematology negative hematology ROS (+)   Anesthesia Other Findings   Reproductive/Obstetrics                             Anesthesia Physical  Anesthesia Plan  ASA: II  Anesthesia Plan: Spinal   Post-op Pain Management:  Regional for Post-op pain   Induction: Intravenous  PONV Risk Score and Plan: 1 and Ondansetron  Airway Management Planned: Nasal Cannula  Additional Equipment:   Intra-op Plan:   Post-operative Plan:   Informed Consent: I have reviewed the patients History and Physical, chart, labs and discussed the procedure including the risks, benefits and alternatives for the proposed anesthesia with the patient or authorized representative who has indicated his/her understanding and acceptance.   Dental advisory given  Plan Discussed with: CRNA, Surgeon and Anesthesiologist  Anesthesia Plan Comments: (  )        Anesthesia Quick Evaluation

## 2017-08-23 ENCOUNTER — Inpatient Hospital Stay (HOSPITAL_COMMUNITY): Payer: Medicare Other | Admitting: Anesthesiology

## 2017-08-23 ENCOUNTER — Encounter (HOSPITAL_COMMUNITY): Payer: Self-pay | Admitting: *Deleted

## 2017-08-23 ENCOUNTER — Encounter (HOSPITAL_COMMUNITY)
Admission: RE | Disposition: A | Discharge status: Home / Self Care | Payer: Self-pay | Source: Ambulatory Visit | Attending: Orthopedic Surgery

## 2017-08-23 ENCOUNTER — Other Ambulatory Visit: Payer: Self-pay

## 2017-08-23 ENCOUNTER — Ambulatory Visit (HOSPITAL_COMMUNITY)
Admission: RE | Admit: 2017-08-23 | Discharge: 2017-08-24 | Disposition: A | Discharge status: Home / Self Care | Payer: Medicare Other | Source: Ambulatory Visit | Attending: Orthopedic Surgery | Admitting: Orthopedic Surgery

## 2017-08-23 DIAGNOSIS — F319 Bipolar disorder, unspecified: Secondary | ICD-10-CM | POA: Insufficient documentation

## 2017-08-23 DIAGNOSIS — E119 Type 2 diabetes mellitus without complications: Secondary | ICD-10-CM | POA: Insufficient documentation

## 2017-08-23 DIAGNOSIS — Z96651 Presence of right artificial knee joint: Secondary | ICD-10-CM | POA: Diagnosis not present

## 2017-08-23 DIAGNOSIS — M25462 Effusion, left knee: Secondary | ICD-10-CM | POA: Insufficient documentation

## 2017-08-23 DIAGNOSIS — Z881 Allergy status to other antibiotic agents status: Secondary | ICD-10-CM | POA: Diagnosis not present

## 2017-08-23 DIAGNOSIS — Z79899 Other long term (current) drug therapy: Secondary | ICD-10-CM | POA: Insufficient documentation

## 2017-08-23 DIAGNOSIS — Z7984 Long term (current) use of oral hypoglycemic drugs: Secondary | ICD-10-CM | POA: Insufficient documentation

## 2017-08-23 DIAGNOSIS — G4489 Other headache syndrome: Secondary | ICD-10-CM | POA: Insufficient documentation

## 2017-08-23 DIAGNOSIS — M1712 Unilateral primary osteoarthritis, left knee: Principal | ICD-10-CM | POA: Diagnosis present

## 2017-08-23 DIAGNOSIS — E785 Hyperlipidemia, unspecified: Secondary | ICD-10-CM | POA: Insufficient documentation

## 2017-08-23 DIAGNOSIS — I10 Essential (primary) hypertension: Secondary | ICD-10-CM | POA: Diagnosis not present

## 2017-08-23 DIAGNOSIS — F17210 Nicotine dependence, cigarettes, uncomplicated: Secondary | ICD-10-CM | POA: Diagnosis not present

## 2017-08-23 DIAGNOSIS — Z882 Allergy status to sulfonamides status: Secondary | ICD-10-CM | POA: Insufficient documentation

## 2017-08-23 DIAGNOSIS — Z7982 Long term (current) use of aspirin: Secondary | ICD-10-CM | POA: Diagnosis not present

## 2017-08-23 HISTORY — PX: TOTAL KNEE ARTHROPLASTY: SHX125

## 2017-08-23 LAB — TYPE AND SCREEN
ABO/RH(D): A NEG
Antibody Screen: NEGATIVE

## 2017-08-23 LAB — GLUCOSE, CAPILLARY
GLUCOSE-CAPILLARY: 215 mg/dL — AB (ref 65–99)
Glucose-Capillary: 107 mg/dL — ABNORMAL HIGH (ref 65–99)
Glucose-Capillary: 102 mg/dL — ABNORMAL HIGH (ref 65–99)
Glucose-Capillary: 200 mg/dL — ABNORMAL HIGH (ref 65–99)

## 2017-08-23 SURGERY — ARTHROPLASTY, KNEE, TOTAL
Anesthesia: Spinal | Site: Knee | Laterality: Left

## 2017-08-23 MED ORDER — SODIUM CHLORIDE 0.9 % IR SOLN
Status: DC | PRN
  Administered 2017-08-23: 1000 mL

## 2017-08-23 MED ORDER — METHOCARBAMOL 500 MG PO TABS
500.0000 mg | ORAL_TABLET | Freq: Four times a day (QID) | ORAL | Status: DC | PRN
  Administered 2017-08-23 – 2017-08-24 (×3): 500 mg via ORAL
  Filled 2017-08-23 (×3): qty 1

## 2017-08-23 MED ORDER — SODIUM CHLORIDE 0.9 % IJ SOLN
INTRAMUSCULAR | Status: AC
  Filled 2017-08-23: qty 50

## 2017-08-23 MED ORDER — KETOROLAC TROMETHAMINE 30 MG/ML IJ SOLN: 30.0000 mg | Freq: Once | INTRAMUSCULAR | Status: DC | PRN

## 2017-08-23 MED ORDER — MIDAZOLAM HCL 2 MG/2ML IJ SOLN
1.0000 mg | INTRAMUSCULAR | Status: DC
  Administered 2017-08-23: 2 mg via INTRAVENOUS
  Filled 2017-08-23: qty 2

## 2017-08-23 MED ORDER — OXYCODONE-ACETAMINOPHEN 5-325 MG PO TABS: 1.0000 | ORAL_TABLET | ORAL | 0 refills | Status: AC | PRN

## 2017-08-23 MED ORDER — TRANEXAMIC ACID 1000 MG/10ML IV SOLN
1000.0000 mg | INTRAVENOUS | Status: AC
  Administered 2017-08-23: 1000 mg via INTRAVENOUS
  Filled 2017-08-23: qty 1100

## 2017-08-23 MED ORDER — ONDANSETRON HCL 4 MG PO TABS
4.0000 mg | ORAL_TABLET | Freq: Four times a day (QID) | ORAL | Status: DC | PRN
Start: 2017-08-23 — End: 2017-08-24

## 2017-08-23 MED ORDER — FENTANYL CITRATE (PF) 100 MCG/2ML IJ SOLN
INTRAMUSCULAR | Status: AC
  Filled 2017-08-23: qty 2

## 2017-08-23 MED ORDER — DIPHENHYDRAMINE HCL 12.5 MG/5ML PO ELIX: 12.5000 mg | ORAL_SOLUTION | ORAL | Status: DC | PRN

## 2017-08-23 MED ORDER — FENTANYL CITRATE (PF) 100 MCG/2ML IJ SOLN
INTRAMUSCULAR | Status: DC | PRN
  Administered 2017-08-23: 50 ug via INTRAVENOUS

## 2017-08-23 MED ORDER — ACETAMINOPHEN 650 MG RE SUPP: 650.0000 mg | RECTAL | Status: DC | PRN

## 2017-08-23 MED ORDER — DOCUSATE SODIUM 100 MG PO CAPS
100.0000 mg | ORAL_CAPSULE | Freq: Two times a day (BID) | ORAL | Status: DC
  Administered 2017-08-23 – 2017-08-24 (×2): 100 mg via ORAL
  Filled 2017-08-23 (×2): qty 1

## 2017-08-23 MED ORDER — ONDANSETRON HCL 4 MG/2ML IJ SOLN
INTRAMUSCULAR | Status: AC
  Filled 2017-08-23: qty 2

## 2017-08-23 MED ORDER — BUPIVACAINE-EPINEPHRINE 0.5% -1:200000 IJ SOLN
INTRAMUSCULAR | Status: DC | PRN
  Administered 2017-08-23: 30 mL

## 2017-08-23 MED ORDER — STERILE WATER FOR IRRIGATION IR SOLN
Status: DC | PRN
  Administered 2017-08-23: 2000 mL

## 2017-08-23 MED ORDER — DEXAMETHASONE SODIUM PHOSPHATE 10 MG/ML IJ SOLN
10.0000 mg | Freq: Two times a day (BID) | INTRAMUSCULAR | Status: AC
  Administered 2017-08-23 – 2017-08-24 (×3): 10 mg via INTRAVENOUS
  Filled 2017-08-23 (×3): qty 1

## 2017-08-23 MED ORDER — LACTATED RINGERS IV SOLN
INTRAVENOUS | Status: DC
  Administered 2017-08-23 (×2): via INTRAVENOUS

## 2017-08-23 MED ORDER — ASPIRIN EC 325 MG PO TBEC
325.0000 mg | DELAYED_RELEASE_TABLET | Freq: Two times a day (BID) | ORAL | Status: DC
  Administered 2017-08-23 – 2017-08-24 (×2): 325 mg via ORAL
  Filled 2017-08-23 (×2): qty 1

## 2017-08-23 MED ORDER — POLYETHYLENE GLYCOL 3350 17 G PO PACK: 17.0000 g | PACK | Freq: Every day | ORAL | Status: DC | PRN

## 2017-08-23 MED ORDER — ACETAMINOPHEN 160 MG/5ML PO SOLN: 325.0000 mg | ORAL | Status: DC | PRN

## 2017-08-23 MED ORDER — TRANEXAMIC ACID 1000 MG/10ML IV SOLN
1000.0000 mg | Freq: Once | INTRAVENOUS | Status: AC
  Administered 2017-08-23: 1000 mg via INTRAVENOUS
  Filled 2017-08-23: qty 1100

## 2017-08-23 MED ORDER — CLINDAMYCIN PHOSPHATE 600 MG/50ML IV SOLN
600.0000 mg | Freq: Four times a day (QID) | INTRAVENOUS | Status: AC
  Administered 2017-08-23 (×2): 600 mg via INTRAVENOUS
  Filled 2017-08-23 (×2): qty 50

## 2017-08-23 MED ORDER — BUPIVACAINE-EPINEPHRINE (PF) 0.5% -1:200000 IJ SOLN
INTRAMUSCULAR | Status: AC
  Filled 2017-08-23: qty 30

## 2017-08-23 MED ORDER — BUPIVACAINE HCL (PF) 0.75 % IJ SOLN
INTRAMUSCULAR | Status: DC | PRN
  Administered 2017-08-23: 25 mL via PERINEURAL

## 2017-08-23 MED ORDER — BUPIVACAINE LIPOSOME 1.3 % IJ SUSP
INTRAMUSCULAR | Status: DC | PRN
  Administered 2017-08-23: 20 mL

## 2017-08-23 MED ORDER — PROPOFOL 10 MG/ML IV BOLUS
INTRAVENOUS | Status: AC
  Filled 2017-08-23: qty 20

## 2017-08-23 MED ORDER — DIVALPROEX SODIUM ER 500 MG PO TB24
1000.0000 mg | ORAL_TABLET | Freq: Every day | ORAL | Status: DC
  Administered 2017-08-23: 1000 mg via ORAL
  Filled 2017-08-23: qty 2

## 2017-08-23 MED ORDER — GABAPENTIN 300 MG PO CAPS
300.0000 mg | ORAL_CAPSULE | Freq: Two times a day (BID) | ORAL | Status: DC
  Administered 2017-08-23 – 2017-08-24 (×3): 300 mg via ORAL
  Filled 2017-08-23 (×3): qty 1

## 2017-08-23 MED ORDER — TIZANIDINE HCL 2 MG PO TABS: 2.0000 mg | ORAL_TABLET | Freq: Three times a day (TID) | ORAL | 0 refills | Status: AC | PRN

## 2017-08-23 MED ORDER — MIDAZOLAM HCL 2 MG/2ML IJ SOLN
INTRAMUSCULAR | Status: AC
  Filled 2017-08-23: qty 2

## 2017-08-23 MED ORDER — ALUM & MAG HYDROXIDE-SIMETH 200-200-20 MG/5ML PO SUSP: 30.0000 mL | ORAL | Status: DC | PRN

## 2017-08-23 MED ORDER — BISACODYL 5 MG PO TBEC: 5.0000 mg | DELAYED_RELEASE_TABLET | Freq: Every day | ORAL | Status: DC | PRN

## 2017-08-23 MED ORDER — PROPOFOL 10 MG/ML IV BOLUS
INTRAVENOUS | Status: AC
  Filled 2017-08-23: qty 60

## 2017-08-23 MED ORDER — OXYCODONE HCL 5 MG PO TABS: 5.0000 mg | ORAL_TABLET | Freq: Once | ORAL | Status: DC | PRN

## 2017-08-23 MED ORDER — MEPERIDINE HCL 50 MG/ML IJ SOLN: 6.2500 mg | INTRAMUSCULAR | Status: DC | PRN

## 2017-08-23 MED ORDER — CLINDAMYCIN PHOSPHATE 900 MG/50ML IV SOLN
900.0000 mg | INTRAVENOUS | Status: AC
  Administered 2017-08-23: 900 mg via INTRAVENOUS
  Filled 2017-08-23: qty 50

## 2017-08-23 MED ORDER — HYDROMORPHONE HCL 1 MG/ML IJ SOLN
0.5000 mg | INTRAMUSCULAR | Status: DC | PRN
  Administered 2017-08-23 – 2017-08-24 (×3): 1 mg via INTRAVENOUS
  Filled 2017-08-23 (×3): qty 1

## 2017-08-23 MED ORDER — SODIUM CHLORIDE 0.9 % IJ SOLN
INTRAMUSCULAR | Status: DC | PRN
  Administered 2017-08-23: 20 mL

## 2017-08-23 MED ORDER — PANTOPRAZOLE SODIUM 40 MG PO TBEC
80.0000 mg | DELAYED_RELEASE_TABLET | Freq: Every day | ORAL | Status: DC
  Administered 2017-08-23 – 2017-08-24 (×2): 80 mg via ORAL
  Filled 2017-08-23 (×2): qty 2

## 2017-08-23 MED ORDER — BUPIVACAINE IN DEXTROSE 0.75-8.25 % IT SOLN
INTRATHECAL | Status: DC | PRN
  Administered 2017-08-23: 1.6 mL via INTRATHECAL

## 2017-08-23 MED ORDER — ACETAMINOPHEN 325 MG PO TABS
650.0000 mg | ORAL_TABLET | ORAL | Status: DC | PRN
  Administered 2017-08-24: 650 mg via ORAL
  Filled 2017-08-23: qty 2

## 2017-08-23 MED ORDER — INSULIN ASPART 100 UNIT/ML ~~LOC~~ SOLN
0.0000 [IU] | Freq: Three times a day (TID) | SUBCUTANEOUS | Status: DC
  Administered 2017-08-23 – 2017-08-24 (×2): 3 [IU] via SUBCUTANEOUS

## 2017-08-23 MED ORDER — FENTANYL CITRATE (PF) 100 MCG/2ML IJ SOLN
50.0000 ug | INTRAMUSCULAR | Status: DC
  Administered 2017-08-23: 100 ug via INTRAVENOUS
  Filled 2017-08-23: qty 2

## 2017-08-23 MED ORDER — LINAGLIPTIN 5 MG PO TABS
5.0000 mg | ORAL_TABLET | Freq: Every day | ORAL | Status: DC
  Administered 2017-08-24: 08:00:00 5 mg via ORAL
  Filled 2017-08-23: qty 1

## 2017-08-23 MED ORDER — ONDANSETRON HCL 4 MG/2ML IJ SOLN: 4.0000 mg | Freq: Four times a day (QID) | INTRAMUSCULAR | Status: DC | PRN

## 2017-08-23 MED ORDER — MAGNESIUM CITRATE PO SOLN: 1.0000 | Freq: Once | ORAL | Status: DC | PRN

## 2017-08-23 MED ORDER — PROPOFOL 10 MG/ML IV BOLUS
INTRAVENOUS | Status: DC | PRN
  Administered 2017-08-23: 30 mg via INTRAVENOUS

## 2017-08-23 MED ORDER — METFORMIN HCL 500 MG PO TABS
500.0000 mg | ORAL_TABLET | Freq: Every day | ORAL | Status: DC
  Administered 2017-08-24: 08:00:00 500 mg via ORAL
  Filled 2017-08-23: qty 1

## 2017-08-23 MED ORDER — SITAGLIP PHOS-METFORMIN HCL ER 50-500 MG PO TB24: 1.0000 | ORAL_TABLET | Freq: Every day | ORAL | Status: DC

## 2017-08-23 MED ORDER — ONDANSETRON HCL 4 MG/2ML IJ SOLN: 4.0000 mg | Freq: Once | INTRAMUSCULAR | Status: DC | PRN

## 2017-08-23 MED ORDER — LISINOPRIL 10 MG PO TABS
10.0000 mg | ORAL_TABLET | Freq: Every day | ORAL | Status: DC
  Administered 2017-08-24: 10 mg via ORAL
  Filled 2017-08-23: qty 1

## 2017-08-23 MED ORDER — ASPIRIN EC 325 MG PO TBEC: 325.0000 mg | DELAYED_RELEASE_TABLET | Freq: Two times a day (BID) | ORAL | 0 refills | Status: AC

## 2017-08-23 MED ORDER — OXYCODONE HCL 5 MG PO TABS
5.0000 mg | ORAL_TABLET | ORAL | Status: DC | PRN
  Administered 2017-08-23: 5 mg via ORAL
  Administered 2017-08-23 (×2): 10 mg via ORAL
  Administered 2017-08-24 (×3): 5 mg via ORAL
  Filled 2017-08-23: qty 1
  Filled 2017-08-23: qty 2
  Filled 2017-08-23: qty 1
  Filled 2017-08-23 (×3): qty 2
  Filled 2017-08-23: qty 1

## 2017-08-23 MED ORDER — PROPOFOL 500 MG/50ML IV EMUL
INTRAVENOUS | Status: DC | PRN
  Administered 2017-08-23: 75 ug/kg/min via INTRAVENOUS

## 2017-08-23 MED ORDER — ACETAMINOPHEN 325 MG PO TABS: 325.0000 mg | ORAL_TABLET | ORAL | Status: DC | PRN

## 2017-08-23 MED ORDER — DEXAMETHASONE SODIUM PHOSPHATE 10 MG/ML IJ SOLN
INTRAMUSCULAR | Status: AC
  Filled 2017-08-23: qty 1

## 2017-08-23 MED ORDER — FENTANYL CITRATE (PF) 100 MCG/2ML IJ SOLN: 25.0000 ug | INTRAMUSCULAR | Status: DC | PRN

## 2017-08-23 MED ORDER — OXYCODONE HCL 5 MG/5ML PO SOLN
5.0000 mg | Freq: Once | ORAL | Status: DC | PRN
  Filled 2017-08-23: qty 5

## 2017-08-23 MED ORDER — ATORVASTATIN CALCIUM 40 MG PO TABS
40.0000 mg | ORAL_TABLET | Freq: Every day | ORAL | Status: DC
  Administered 2017-08-23 – 2017-08-24 (×2): 40 mg via ORAL
  Filled 2017-08-23 (×2): qty 1

## 2017-08-23 MED ORDER — METHOCARBAMOL 1000 MG/10ML IJ SOLN
500.0000 mg | Freq: Four times a day (QID) | INTRAVENOUS | Status: DC | PRN
  Filled 2017-08-23: qty 5

## 2017-08-23 MED ORDER — BUPIVACAINE LIPOSOME 1.3 % IJ SUSP
20.0000 mL | Freq: Once | INTRAMUSCULAR | Status: DC
  Filled 2017-08-23: qty 20

## 2017-08-23 MED ORDER — SODIUM CHLORIDE 0.9 % IV SOLN
INTRAVENOUS | Status: DC
  Administered 2017-08-23: 17:00:00 via INTRAVENOUS

## 2017-08-23 MED ORDER — MIDAZOLAM HCL 5 MG/5ML IJ SOLN
INTRAMUSCULAR | Status: DC | PRN
  Administered 2017-08-23 (×2): 1 mg via INTRAVENOUS

## 2017-08-23 MED ORDER — CHLORHEXIDINE GLUCONATE 4 % EX LIQD: 60.0000 mL | Freq: Once | CUTANEOUS | Status: DC

## 2017-08-23 SURGICAL SUPPLY — 54 items
APL SKNCLS STERI-STRIP NONHPOA (GAUZE/BANDAGES/DRESSINGS) ×1
BAG SPEC THK2 15X12 ZIP CLS (MISCELLANEOUS) ×1
BAG ZIPLOCK 12X15 (MISCELLANEOUS) ×3 IMPLANT
BANDAGE ACE 4X5 VEL STRL LF (GAUZE/BANDAGES/DRESSINGS) ×1 IMPLANT
BANDAGE ACE 6X5 VEL STRL LF (GAUZE/BANDAGES/DRESSINGS) ×3 IMPLANT
BENZOIN TINCTURE PRP APPL 2/3 (GAUZE/BANDAGES/DRESSINGS) ×3 IMPLANT
BLADE SAG 18X100X1.27 (BLADE) ×3 IMPLANT
BLADE SAW SGTL 13.0X1.19X90.0M (BLADE) ×3 IMPLANT
BOOTIES KNEE HIGH SLOAN (MISCELLANEOUS) ×4 IMPLANT
BOWL SMART MIX CTS (DISPOSABLE) ×3 IMPLANT
CAPT KNEE TOTAL 3 ATTUNE ×2 IMPLANT
CEMENT HV SMART SET (Cement) ×6 IMPLANT
CLOSURE WOUND 1/2 X4 (GAUZE/BANDAGES/DRESSINGS) ×1
CUFF TOURN SGL QUICK 34 (TOURNIQUET CUFF) ×3
CUFF TRNQT CYL 34X4X40X1 (TOURNIQUET CUFF) ×1 IMPLANT
DRAPE U-SHAPE 47X51 STRL (DRAPES) ×3 IMPLANT
DRSG AQUACEL AG ADV 3.5X10 (GAUZE/BANDAGES/DRESSINGS) ×3 IMPLANT
DURAPREP 26ML APPLICATOR (WOUND CARE) ×6 IMPLANT
ELECT REM PT RETURN 15FT ADLT (MISCELLANEOUS) ×3 IMPLANT
GAUZE SPONGE 4X4 12PLY STRL (GAUZE/BANDAGES/DRESSINGS) ×3 IMPLANT
GLOVE BIOGEL PI IND STRL 7.0 (GLOVE) IMPLANT
GLOVE BIOGEL PI IND STRL 8 (GLOVE) ×2 IMPLANT
GLOVE BIOGEL PI INDICATOR 7.0 (GLOVE) ×8
GLOVE BIOGEL PI INDICATOR 8 (GLOVE) ×4
GLOVE ECLIPSE 7.5 STRL STRAW (GLOVE) ×6 IMPLANT
GOWN STRL REUS W/ TWL LRG LVL3 (GOWN DISPOSABLE) IMPLANT
GOWN STRL REUS W/TWL LRG LVL3 (GOWN DISPOSABLE) ×6
GOWN STRL REUS W/TWL XL LVL3 (GOWN DISPOSABLE) ×6 IMPLANT
HANDPIECE INTERPULSE COAX TIP (DISPOSABLE) ×3
HOOD PEEL AWAY FLYTE STAYCOOL (MISCELLANEOUS) ×9 IMPLANT
IMMOBILIZER KNEE 20 (SOFTGOODS) ×3
IMMOBILIZER KNEE 20 THIGH 36 (SOFTGOODS) IMPLANT
MANIFOLD NEPTUNE II (INSTRUMENTS) ×3 IMPLANT
NDL HYPO 21X1.5 SAFETY (NEEDLE) ×2 IMPLANT
NEEDLE HYPO 21X1.5 SAFETY (NEEDLE) IMPLANT
NS IRRIG 1000ML POUR BTL (IV SOLUTION) ×3 IMPLANT
PACK ICE MAXI GEL EZY WRAP (MISCELLANEOUS) ×3 IMPLANT
PACK TOTAL KNEE CUSTOM (KITS) ×3 IMPLANT
PADDING CAST COTTON 6X4 STRL (CAST SUPPLIES) ×4 IMPLANT
POSITIONER SURGICAL ARM (MISCELLANEOUS) ×3 IMPLANT
SET HNDPC FAN SPRY TIP SCT (DISPOSABLE) ×1 IMPLANT
STAPLER VISISTAT 35W (STAPLE) ×1 IMPLANT
STRIP CLOSURE SKIN 1/2X4 (GAUZE/BANDAGES/DRESSINGS) ×2 IMPLANT
SUT MNCRL AB 3-0 PS2 18 (SUTURE) ×3 IMPLANT
SUT VIC AB 0 CT1 36 (SUTURE) ×5 IMPLANT
SUT VIC AB 1 CT1 36 (SUTURE) ×6 IMPLANT
SUT VIC AB 2-0 CT1 27 (SUTURE) ×6
SUT VIC AB 2-0 CT1 TAPERPNT 27 (SUTURE) ×1 IMPLANT
SYRINGE 10CC LL (SYRINGE) ×6 IMPLANT
TOWEL OR NON WOVEN STRL DISP B (DISPOSABLE) ×3 IMPLANT
TRAY FOLEY BAG SILVER LF 16FR (CATHETERS) ×3 IMPLANT
WATER STERILE IRR 1000ML POUR (IV SOLUTION) ×5 IMPLANT
WRAP KNEE MAXI GEL POST OP (GAUZE/BANDAGES/DRESSINGS) ×2 IMPLANT
YANKAUER SUCT BULB TIP 10FT TU (MISCELLANEOUS) ×3 IMPLANT

## 2017-08-23 NOTE — Progress Notes (Signed)
Pt very agitated, yelling and cursing. Pt requests CPM to be taken off , requests foley to be removed and requests a regular diet vs a diabetic diet. Informed PA. Orders given for pt to have foley d/c'd , regular diet and no cpm.

## 2017-08-23 NOTE — Brief Op Note (Signed)
08/23/2017  11:05 AM  PATIENT:  Peter Roth  57 y.o. male  PRE-OPERATIVE DIAGNOSIS:  OSTEOARTHRITIS LEFT KNEE  POST-OPERATIVE DIAGNOSIS:  OSTEOARTHRITIS LEFT KNEE  PROCEDURE:  Procedure(s): LEFT TOTAL KNEE ARTHROPLASTY (Left)  SURGEON:  Surgeon(s) and Role:    Jodi Geralds* Ryli Standlee, MD - Primary  PHYSICIAN ASSISTANT:   ASSISTANTS: bethune   ANESTHESIA:   spinal  EBL:  100 mL   BLOOD ADMINISTERED:none  DRAINS: none   LOCAL MEDICATIONS USED:  MARCAINE    and OTHER experel  SPECIMEN:  No Specimen  DISPOSITION OF SPECIMEN:  N/A  COUNTS:  YES  TOURNIQUET:   Total Tourniquet Time Documented: Thigh (Left) - 46 minutes Total: Thigh (Left) - 46 minutes   DICTATION: .Other Dictation: Dictation Number [id number]  PLAN OF CARE: Admit to inpatient   PATIENT DISPOSITION:  PACU - hemodynamically stable.   Delay start of Pharmacological VTE agent (>24hrs) due to surgical blood loss or risk of bleeding: no

## 2017-08-23 NOTE — Plan of Care (Signed)
  Progressing Education: Knowledge of General Education information will improve 08/23/2017 1310 - Progressing by Cordie Griceodriguez, Vedanshi Massaro A, RN Health Behavior/Discharge Planning: Ability to manage health-related needs will improve 08/23/2017 1310 - Progressing by Cordie Griceodriguez, Tannia Contino A, RN Clinical Measurements: Ability to maintain clinical measurements within normal limits will improve 08/23/2017 1310 - Progressing by Cordie Griceodriguez, Priya Matsen A, RN Will remain free from infection 08/23/2017 1310 - Progressing by Cordie Griceodriguez, Kaulin Chaves A, RN Diagnostic test results will improve 08/23/2017 1310 - Progressing by Cordie Griceodriguez, Sanja Elizardo A, RN Respiratory complications will improve 08/23/2017 1310 - Progressing by Cordie Griceodriguez, Sundai Probert A, RN Cardiovascular complication will be avoided 08/23/2017 1310 - Progressing by Cordie Griceodriguez, Bani Gianfrancesco A, RN Activity: Risk for activity intolerance will decrease 08/23/2017 1310 - Progressing by Cordie Griceodriguez, Katrisha Segall A, RN Nutrition: Adequate nutrition will be maintained 08/23/2017 1310 - Progressing by Cordie Griceodriguez, Kathy Wahid A, RN Coping: Level of anxiety will decrease 08/23/2017 1310 - Progressing by Cordie Griceodriguez, Faduma Cho A, RN Elimination: Will not experience complications related to bowel motility 08/23/2017 1310 - Progressing by Cordie Griceodriguez, Raahim Shartzer A, RN Will not experience complications related to urinary retention 08/23/2017 1310 - Progressing by Cordie Griceodriguez, Evelette Hollern A, RN Pain Managment: General experience of comfort will improve 08/23/2017 1310 - Progressing by Cordie Griceodriguez, Shakaria Raphael A, RN Safety: Ability to remain free from injury will improve 08/23/2017 1310 - Progressing by Cordie Griceodriguez, Rozanna Cormany A, RN Skin Integrity: Risk for impaired skin integrity will decrease 08/23/2017 1310 - Progressing by Cordie Griceodriguez, Tedi Hughson A, RN Education: Knowledge of the prescribed therapeutic regimen will improve 08/23/2017 1310 - Progressing by Cordie Griceodriguez, Ramiel Forti A, RN Activity: Ability to avoid complications of mobility impairment will  improve 08/23/2017 1310 - Progressing by Cordie Griceodriguez, Zeniya Lapidus A, RN Range of joint motion will improve 08/23/2017 1310 - Progressing by Cordie Griceodriguez, Jeanett Antonopoulos A, RN Clinical Measurements: Postoperative complications will be avoided or minimized 08/23/2017 1310 - Progressing by Cordie Griceodriguez, Fredrich Cory A, RN Pain Management: Pain level will decrease with appropriate interventions 08/23/2017 1310 - Progressing by Cordie Griceodriguez, Cindy Fullman A, RN Skin Integrity: Signs of wound healing will improve 08/23/2017 1310 - Progressing by Cordie Griceodriguez, Aleja Yearwood A, RN

## 2017-08-23 NOTE — Transfer of Care (Signed)
Immediate Anesthesia Transfer of Care Note  Patient: Peter Roth  Procedure(s) Performed: Procedure(s): LEFT TOTAL KNEE ARTHROPLASTY (Left)  Patient Location: PACU  Anesthesia Type:Spinal  Level of Consciousness:  sedated, patient cooperative and responds to stimulation  Airway & Oxygen Therapy:Patient Spontanous Breathing and Patient connected to face mask oxgen  Post-op Assessment:  Report given to PACU RN and Post -op Vital signs reviewed and stable  Post vital signs:  Reviewed and stable  Last Vitals:  Vitals:   08/23/17 0916 08/23/17 0917  BP:    Pulse: 71 73  Resp: 10 16  Temp:    SpO2: 100% 100%    Complications: No apparent anesthesia complications

## 2017-08-23 NOTE — Anesthesia Procedure Notes (Signed)
Procedure Name: MAC Date/Time: 08/23/2017 9:33 AM Performed by: Lissa Morales, CRNA Pre-anesthesia Checklist: Patient identified, Emergency Drugs available, Suction available, Patient being monitored and Timeout performed Patient Re-evaluated:Patient Re-evaluated prior to induction Oxygen Delivery Method: Simple face mask Placement Confirmation: positive ETCO2 Dental Injury: Teeth and Oropharynx as per pre-operative assessment

## 2017-08-23 NOTE — Anesthesia Procedure Notes (Signed)
Performed by: Dequandre Cordova E, CRNA       

## 2017-08-23 NOTE — Anesthesia Postprocedure Evaluation (Signed)
Anesthesia Post Note  Patient: Peter SchlatterChester K Verbrugge  Procedure(s) Performed: LEFT TOTAL KNEE ARTHROPLASTY (Left Knee)     Patient location during evaluation: PACU Anesthesia Type: Spinal Level of consciousness: sedated Pain management: pain level controlled Vital Signs Assessment: post-procedure vital signs reviewed and stable Respiratory status: patient remains intubated per anesthesia plan Cardiovascular status: stable Postop Assessment: no apparent nausea or vomiting Anesthetic complications: no    Last Vitals:  Vitals:   08/23/17 0917 08/23/17 1130  BP:  (P) 116/67  Pulse: 73 (P) 68  Resp: 16 (P) 18  Temp:  (!) (P) 36.4 C  SpO2: 100% (P) 99%    Last Pain:  Vitals:   08/23/17 0805  TempSrc: Oral                 Brenlynn Fake

## 2017-08-23 NOTE — Discharge Instructions (Signed)

## 2017-08-23 NOTE — Anesthesia Procedure Notes (Addendum)
Anesthesia Regional Block: Digital block   Pre-Anesthetic Checklist: ,, timeout performed, Correct Patient, Correct Site, Correct Laterality, Correct Procedure, Correct Position, site marked, Risks and benefits discussed,  Surgical consent,  Pre-op evaluation,  At surgeon's request and post-op pain management  Laterality: Left  Prep: chloraprep       Needles:  Injection technique: Single-shot  Needle Type: Echogenic Stimulator Needle     Needle Length: 5cm  Needle Gauge: 22     Additional Needles:   Procedures:, nerve stimulator,,, ultrasound used (permanent image in chart),,,,  Narrative:  Start time: 08/23/2017 8:40 AM End time: 08/23/2017 8:45 AM Injection made incrementally with aspirations every 5 mL.  Performed by: Personally  Anesthesiologist: Bethena Midgetddono, Ernest, MD  Additional Notes: Functioning IV was confirmed and monitors were applied.  A 50mm 22ga Arrow echogenic stimulator needle was used. Sterile prep and drape,hand hygiene and sterile gloves were used. Ultrasound guidance: relevant anatomy identified, needle position confirmed, local anesthetic spread visualized around nerve(s)., vascular puncture avoided.  Image printed for medical record. Negative aspiration and negative test dose prior to incremental administration of local anesthetic. The patient tolerated the procedure well.

## 2017-08-23 NOTE — Evaluation (Signed)
Physical Therapy Evaluation Patient Details Name: Peter Roth MRN: [id number] DOB: December 31, 1960 Today's Date: 08/23/2017   History of Present Illness  Pt s/p L TKR and with hx of recent R TKR and bipolar  Clinical Impression  Pt s/p L TKR and presents with decreased L LE strength/ROM and post op pain limiting functional mobility.  Pt should progress to dc home with family assist.    Follow Up Recommendations Home health PT    Equipment Recommendations  None recommended by PT    Recommendations for Other Services       Precautions / Restrictions Precautions Precautions: Knee;Fall Required Braces or Orthoses: Knee Immobilizer - Left Knee Immobilizer - Left: Discontinue once straight leg raise with < 10 degree lag Restrictions Weight Bearing Restrictions: No Other Position/Activity Restrictions: WBAT      Mobility  Bed Mobility Overal bed mobility: Needs Assistance Bed Mobility: Supine to Sit     Supine to sit: Min assist     General bed mobility comments: cues for sequence and use of R LE to self assist  Transfers Overall transfer level: Needs assistance Equipment used: Rolling walker (2 wheeled) Transfers: Sit to/from Stand Sit to Stand: Min assist         General transfer comment: cues for LE management and use of UEs to self assist  Ambulation/Gait Ambulation/Gait assistance: Min assist Ambulation Distance (Feet): 90 Feet Assistive device: Rolling walker (2 wheeled) Gait Pattern/deviations: Step-to pattern;Decreased step length - right;Decreased step length - left;Shuffle;Trunk flexed Gait velocity: decr Gait velocity interpretation: Below normal speed for age/gender General Gait Details: cues for posture, position from RW and initial sequence  Stairs            Wheelchair Mobility    Modified Rankin (Stroke Patients Only)       Balance                                             Pertinent Vitals/Pain Pain  Assessment: 0-10 Pain Score: 7  Pain Location: L knee Pain Descriptors / Indicators: Aching;Sore Pain Intervention(s): Limited activity within patient's tolerance;Monitored during session;Premedicated before session;Ice applied    Home Living Family/patient expects to be discharged to:: Private residence Living Arrangements: Spouse/significant other Available Help at Discharge: Family Type of Home: House Home Access: Stairs to enter   Secretary/administrator of Steps: 1 Home Layout: One level Home Equipment: Environmental consultant - 2 wheels;Bedside commode      Prior Function Level of Independence: Independent               Hand Dominance        Extremity/Trunk Assessment   Upper Extremity Assessment Upper Extremity Assessment: Overall WFL for tasks assessed    Lower Extremity Assessment Lower Extremity Assessment: LLE deficits/detail    Cervical / Trunk Assessment Cervical / Trunk Assessment: Normal  Communication   Communication: No difficulties  Cognition Arousal/Alertness: Awake/alert Behavior During Therapy: WFL for tasks assessed/performed Overall Cognitive Status: Within Functional Limits for tasks assessed                                        General Comments      Exercises Total Joint Exercises Ankle Circles/Pumps: AROM;Both;15 reps;Supine   Assessment/Plan    PT Assessment Patient needs continued PT  services  PT Problem List Decreased strength;Decreased range of motion;Decreased activity tolerance;Decreased mobility;Decreased knowledge of use of DME;Pain       PT Treatment Interventions Stair training;DME instruction;Gait training;Functional mobility training;Therapeutic activities;Therapeutic exercise;Patient/family education    PT Goals (Current goals can be found in the Care Plan section)  Acute Rehab PT Goals Patient Stated Goal: Regain IND PT Goal Formulation: With patient Time For Goal Achievement: 08/30/17 Potential to Achieve  Goals: Good    Frequency 7X/week   Barriers to discharge        Co-evaluation               AM-PAC PT "6 Clicks" Daily Activity  Outcome Measure Difficulty turning over in bed (including adjusting bedclothes, sheets and blankets)?: A Lot Difficulty moving from lying on back to sitting on the side of the bed? : A Lot Difficulty sitting down on and standing up from a chair with arms (e.g., wheelchair, bedside commode, etc,.)?: A Lot Help needed moving to and from a bed to chair (including a wheelchair)?: A Little Help needed walking in hospital room?: A Little Help needed climbing 3-5 steps with a railing? : A Little 6 Click Score: 15    End of Session Equipment Utilized During Treatment: Gait belt;Left knee immobilizer Activity Tolerance: Patient tolerated treatment well Patient left: with call bell/phone within reach;in bed;with nursing/sitter in room Nurse Communication: Mobility status PT Visit Diagnosis: Difficulty in walking, not elsewhere classified (R26.2)    Time: [id number] PT Time Calculation (min) (ACUTE ONLY): 23 min   Charges:   PT Evaluation $PT Eval Low Complexity: 1 Low PT Treatments $Gait Training: 8-22 mins   PT G Codes:        Pg [phone]   Marleah Beever 08/23/2017, 5:20 PM

## 2017-08-23 NOTE — Anesthesia Procedure Notes (Signed)
Spinal  Patient location during procedure: OR End time: 08/23/2017 9:42 AM Staffing Resident/CRNA: Lissa Morales, CRNA Performed: resident/CRNA  Preanesthetic Checklist Completed: patient identified, site marked, surgical consent, pre-op evaluation, timeout performed, IV checked, risks and benefits discussed and monitors and equipment checked Spinal Block Patient position: sitting Prep: ChloraPrep Patient monitoring: heart rate, continuous pulse ox and blood pressure Approach: left paramedian Location: L3-4 Injection technique: single-shot Needle Needle type: Sprotte  Needle gauge: 24 G Needle length: 9 cm Assessment Sensory level: T8 Additional Notes Expiration date of kit checked and confirmed. Patient tolerated procedure well, without complications.

## 2017-08-23 NOTE — Progress Notes (Signed)
Assisted Dr. Oddono with left, ultrasound guided, adductor canal block. Side rails up, monitors on throughout procedure. See vital signs in flow sheet. Tolerated Procedure well.  

## 2017-08-23 NOTE — H&P (Addendum)
TOTAL KNEE ADMISSION H&P  Patient is being admitted for left total knee arthroplasty.  Subjective:  Chief Complaint:left knee pain.  HPI: Peter Roth, 57 y.o. male, has a history of pain and functional disability in the left knee due to arthritis and has failed non-surgical conservative treatments for greater than 12 weeks to includeNSAID's and/or analgesics, corticosteriod injections, viscosupplementation injections, weight reduction as appropriate and activity modification.  Onset of symptoms was gradual, starting 3 years ago with gradually worsening course since that time. The patient noted no past surgery on the left knee(s).  Patient currently rates pain in the left knee(s) at 9 out of 10 with activity. Patient has night pain, worsening of pain with activity and weight bearing, pain that interferes with activities of daily living, pain with passive range of motion, crepitus and joint swelling.  Patient has evidence of subchondral cysts, periarticular osteophytes and joint space narrowing by imaging studies. This patient has had Failure of all reasonable conservative care. There is no active infection.  Patient Active Problem List   Diagnosis Date Noted  . Primary osteoarthritis of right knee 06/07/2017  . Primary osteoarthritis of left knee 06/07/2017  . Effusion, left knee 06/07/2017   Past Medical History:  Diagnosis Date  . Arthritis   . Bipolar 1 disorder ([place])   . Hyperlipidemia   . Hypertension   . Stress headaches   . Type 2 diabetes mellitus ([place])    followed by pcp  . Wears glasses     Past Surgical History:  Procedure Laterality Date  . KNEE ARTHROSCOPY Right ?date  . TOTAL KNEE ARTHROPLASTY Right 06/07/2017   Procedure: RIGHT TOTAL KNEE ARTHROPLASTY, INJECTION LEFT KNEE WITH FLUID ASPIRATION;  Surgeon: Dorna Leitz, MD;  Location: [facility];  Service: Orthopedics;  Laterality: Right;    No current facility-administered medications for this encounter.    Current  Outpatient Medications  Medication Sig Dispense Refill Last Dose  . aspirin EC 325 MG tablet Take 1 tablet (325 mg total) by mouth 2 (two) times daily after a meal. Take x 1 month post op to decrease risk of blood clots. 60 tablet 0   . atorvastatin (LIPITOR) 40 MG tablet Take 40 mg by mouth daily.  3 06/07/2017 at 0630  . diclofenac (VOLTAREN) 75 MG EC tablet Take 75 mg by mouth 2 (two) times daily.     . divalproex (DEPAKOTE ER) 500 MG 24 hr tablet Take 1,000 mg by mouth at bedtime.   06/06/2017 at Unknown time  . docusate sodium (COLACE) 100 MG capsule Take 1 capsule (100 mg total) by mouth 2 (two) times daily. (Patient taking differently: Take 100 mg by mouth 2 (two) times daily as needed for mild constipation. ) 30 capsule 0   . HYDROcodone-acetaminophen (NORCO/VICODIN) 5-325 MG tablet Take 1-2 tablets by mouth every 6 (six) hours as needed for moderate pain.     Marland Kitchen ibuprofen (ADVIL,MOTRIN) 600 MG tablet Take 600 mg by mouth every 6 (six) hours as needed for pain.  2   . lisinopril (PRINIVIL,ZESTRIL) 10 MG tablet Take 10 mg daily by mouth.   06/06/2017 at Unknown time  . omeprazole (PRILOSEC) 40 MG capsule Take 40 mg by mouth daily as needed (acid reflux).    Past Month at Unknown time  . oxyCODONE-acetaminophen (PERCOCET/ROXICET) 5-325 MG tablet Take 1-2 tablets by mouth every 4 (four) hours as needed. (Patient taking differently: Take 1 tablet by mouth every 8 (eight) hours as needed for severe pain. ) 60  tablet 0   . SitaGLIPtin-MetFORMIN HCl (JANUMET XR) 50-500 MG TB24 Take 1 tablet by mouth daily.     Marland Kitchen tiZANidine (ZANAFLEX) 2 MG tablet Take 1 tablet (2 mg total) by mouth every 8 (eight) hours as needed for muscle spasms. 50 tablet 0    Allergies  Allergen Reactions  . Sulfa Antibiotics Anaphylaxis and Other (See Comments)    Mouth sores   . Cephalosporins Swelling    Oral swelling    Social History   Tobacco Use  . Smoking status: Current Every Day Smoker    Packs/day: 0.50     Years: 40.00    Pack years: 20.00    Types: Cigarettes  . Smokeless tobacco: Never Used  Substance Use Topics  . Alcohol use: No    No family history on file.   ROS ROS: I have reviewed the patient's review of systems thoroughly and there are no positive responses as relates to the HPI. Objective:  Physical Exam  Vital signs in last 24 hours:   There were no vitals filed for this visit.  Vitals:   08/23/17 0846 08/23/17 0847  BP:    Pulse: 73 76  Resp: 19 11  Temp:    SpO2: 100% 100%   Well-developed well-nourished patient in no acute distress. Alert and oriented x3 HEENT:within normal limits Cardiac: Regular rate and rhythm Pulmonary: Lungs clear to auscultation Abdomen: Soft and nontender.  Normal active bowel sounds  Musculoskeletal: (Left knee: Painful range of motion.  Limited range of motion.  Trace effusion.  No instability.  Range of motion is 0-115 degrees. Labs: Recent Results (from the past 2160 hour(s))  Surgical pcr screen     Status: None   Collection Time: 05/27/17 10:16 AM  Result Value Ref Range   MRSA, PCR NEGATIVE NEGATIVE   Staphylococcus aureus NEGATIVE NEGATIVE    Comment: (NOTE) The Xpert SA Assay (FDA approved for NASAL specimens in patients 58 years of age and older), is one component of a comprehensive surveillance program. It is not intended to diagnose infection nor to guide or monitor treatment.   Glucose, capillary     Status: Abnormal   Collection Time: 05/27/17 10:35 AM  Result Value Ref Range   Glucose-Capillary 103 (H) 65 - 99 mg/dL  APTT     Status: None   Collection Time: 05/27/17 12:22 PM  Result Value Ref Range   aPTT 31 24 - 36 seconds  Protime-INR     Status: None   Collection Time: 05/27/17 12:22 PM  Result Value Ref Range   Prothrombin Time 13.1 11.4 - 15.2 seconds   INR 1.00   Type and screen Order type and screen if day of surgery is less than 15 days from draw of preadmission visit or order morning of surgery if  day of surgery is greater than 6 days from preadmission visit.     Status: None   Collection Time: 05/27/17 12:22 PM  Result Value Ref Range   ABO/RH(D) A NEG    Antibody Screen NEG    Sample Expiration 06/10/2017    Extend sample reason NO TRANSFUSIONS OR PREGNANCY IN THE PAST 3 MONTHS   ABO/Rh     Status: None   Collection Time: 05/27/17 12:22 PM  Result Value Ref Range   ABO/RH(D) A NEG   Glucose, capillary     Status: Abnormal   Collection Time: 06/07/17  7:59 AM  Result Value Ref Range   Glucose-Capillary 104 (H) 65 - 99  mg/dL  Aerobic/Anaerobic Culture (surgical/deep wound)     Status: None   Collection Time: 06/07/17 10:20 AM  Result Value Ref Range   Specimen Description TISSUE SOFT TISSUE    Special Requests NONE    Gram Stain      NO WBC SEEN NO ORGANISMS SEEN Gram Stain Report Called to,Read Back By and Verified With: [place] [id number] [email or web address]    Culture      No growth aerobically or anaerobically. Performed at [facility], [place] [address], [place], [place] [address]    Report Status 06/12/2017 FINAL   Glucose, capillary     Status: None   Collection Time: 06/07/17 12:01 PM  Result Value Ref Range   Glucose-Capillary 88 65 - 99 mg/dL   Comment 1 Notify RN    Comment 2 Document in Chart   Glucose, capillary     Status: Abnormal   Collection Time: 06/07/17  2:01 PM  Result Value Ref Range   Glucose-Capillary 161 (H) 65 - 99 mg/dL  Glucose, capillary     Status: Abnormal   Collection Time: 06/07/17  4:40 PM  Result Value Ref Range   Glucose-Capillary 232 (H) 65 - 99 mg/dL  Glucose, capillary     Status: Abnormal   Collection Time: 06/07/17  8:40 PM  Result Value Ref Range   Glucose-Capillary 222 (H) 65 - 99 mg/dL  CBC     Status: Abnormal   Collection Time: 06/08/17  5:45 AM  Result Value Ref Range   WBC 11.6 (H) 4.0 - 10.5 K/uL   RBC 3.43 (L) 4.22 - 5.81 MIL/uL   Hemoglobin 9.3 (L) 13.0 - 17.0 g/dL   HCT 29.1 (L) 39.0 - 52.0 %   MCV 84.8  78.0 - 100.0 fL   MCH 27.1 26.0 - 34.0 pg   MCHC 32.0 30.0 - 36.0 g/dL   RDW 13.4 11.5 - 15.5 %   Platelets 276 150 - 400 K/uL  Basic metabolic panel     Status: Abnormal   Collection Time: 06/08/17  5:45 AM  Result Value Ref Range   Sodium 136 135 - 145 mmol/L   Potassium 4.1 3.5 - 5.1 mmol/L   Chloride 103 101 - 111 mmol/L   CO2 25 22 - 32 mmol/L   Glucose, Bld 191 (H) 65 - 99 mg/dL   BUN 16 6 - 20 mg/dL   Creatinine, Ser 0.76 0.61 - 1.24 mg/dL   Calcium 8.7 (L) 8.9 - 10.3 mg/dL   GFR calc non Af Amer >60 >60 mL/min   GFR calc Af Amer >60 >60 mL/min    Comment: (NOTE) The eGFR has been calculated using the CKD EPI equation. This calculation has not been validated in all clinical situations. eGFR's persistently <60 mL/min signify possible Chronic Kidney Disease.    Anion gap 8 5 - 15  Glucose, capillary     Status: Abnormal   Collection Time: 06/08/17  7:20 AM  Result Value Ref Range   Glucose-Capillary 189 (H) 65 - 99 mg/dL  Glucose, capillary     Status: Abnormal   Collection Time: 06/08/17 12:10 PM  Result Value Ref Range   Glucose-Capillary 212 (H) 65 - 99 mg/dL  Glucose, capillary     Status: Abnormal   Collection Time: 06/08/17  5:36 PM  Result Value Ref Range   Glucose-Capillary 155 (H) 65 - 99 mg/dL  Glucose, capillary     Status: Abnormal   Collection Time: 06/08/17  8:37 PM  Result Value Ref Range   Glucose-Capillary 146 (H) 65 - 99 mg/dL  CBC     Status: Abnormal   Collection Time: 06/09/17  5:38 AM  Result Value Ref Range   WBC 13.5 (H) 4.0 - 10.5 K/uL   RBC 3.40 (L) 4.22 - 5.81 MIL/uL   Hemoglobin 9.4 (L) 13.0 - 17.0 g/dL   HCT 29.0 (L) 39.0 - 52.0 %   MCV 85.3 78.0 - 100.0 fL   MCH 27.6 26.0 - 34.0 pg   MCHC 32.4 30.0 - 36.0 g/dL   RDW 13.8 11.5 - 15.5 %   Platelets 278 150 - 400 K/uL  Glucose, capillary     Status: Abnormal   Collection Time: 06/09/17  7:28 AM  Result Value Ref Range   Glucose-Capillary 110 (H) 65 - 99 mg/dL  Urinalysis,  Routine w reflex microscopic     Status: Abnormal   Collection Time: 08/16/17  2:12 PM  Result Value Ref Range   Color, Urine YELLOW YELLOW   APPearance CLEAR CLEAR   Specific Gravity, Urine 1.026 1.005 - 1.030   pH 5.0 5.0 - 8.0   Glucose, UA NEGATIVE NEGATIVE mg/dL   Hgb urine dipstick NEGATIVE NEGATIVE   Bilirubin Urine NEGATIVE NEGATIVE   Ketones, ur 5 (A) NEGATIVE mg/dL   Protein, ur 30 (A) NEGATIVE mg/dL   Nitrite NEGATIVE NEGATIVE   Leukocytes, UA NEGATIVE NEGATIVE   RBC / HPF 0-5 0 - 5 RBC/hpf   WBC, UA 0-5 0 - 5 WBC/hpf   Bacteria, UA NONE SEEN NONE SEEN   Squamous Epithelial / LPF NONE SEEN NONE SEEN   Mucus PRESENT    Ca Oxalate Crys, UA PRESENT     Comment: Performed at [facility], [place] [address], [place], [place] [address]  Glucose, capillary     Status: Abnormal   Collection Time: 08/16/17  2:29 PM  Result Value Ref Range   Glucose-Capillary 121 (H) 65 - 99 mg/dL  Type and screen Order type and screen if day of surgery is less than 15 days from draw of preadmission visit or order morning of surgery if day of surgery is greater than 6 days from preadmission visit.     Status: None   Collection Time: 08/16/17  2:51 PM  Result Value Ref Range   ABO/RH(D) A NEG    Antibody Screen NEG    Sample Expiration 08/26/2017    Extend sample reason      NO TRANSFUSIONS OR PREGNANCY IN THE PAST 3 MONTHS Performed at [facility], [place] [address] [place], [place] [address]   APTT     Status: None   Collection Time: 08/16/17  2:54 PM  Result Value Ref Range   aPTT 33 24 - 36 seconds    Comment: Performed at [facility], [place] [address] [place], [place] [address]  CBC WITH DIFFERENTIAL     Status: Abnormal   Collection Time: 08/16/17  2:54 PM  Result Value Ref Range   WBC 6.2 4.0 - 10.5 K/uL   RBC 4.28 4.22 - 5.81 MIL/uL   Hemoglobin 11.1 (L) 13.0 - 17.0 g/dL   HCT 35.2 (L) 39.0 - 52.0 %   MCV 82.2 78.0 - 100.0 fL    MCH 25.9 (L) 26.0 - 34.0 pg   MCHC 31.5 30.0 - 36.0 g/dL   RDW 14.4 11.5 - 15.5 %   Platelets 261 150 - 400 K/uL   Neutrophils Relative % 53 %  Neutro Abs 3.3 1.7 - 7.7 K/uL   Lymphocytes Relative 38 %   Lymphs Abs 2.4 0.7 - 4.0 K/uL   Monocytes Relative 6 %   Monocytes Absolute 0.4 0.1 - 1.0 K/uL   Eosinophils Relative 3 %   Eosinophils Absolute 0.2 0.0 - 0.7 K/uL   Basophils Relative 0 %   Basophils Absolute 0.0 0.0 - 0.1 K/uL    Comment: Performed at [facility], [place] [address] [place], [place] [address]  Comprehensive metabolic panel     Status: Abnormal   Collection Time: 08/16/17  2:54 PM  Result Value Ref Range   Sodium 140 135 - 145 mmol/L   Potassium 4.3 3.5 - 5.1 mmol/L   Chloride 107 101 - 111 mmol/L   CO2 26 22 - 32 mmol/L   Glucose, Bld 106 (H) 65 - 99 mg/dL   BUN 13 6 - 20 mg/dL   Creatinine, Ser 0.73 0.61 - 1.24 mg/dL   Calcium 8.8 (L) 8.9 - 10.3 mg/dL   Total Protein 6.7 6.5 - 8.1 g/dL   Albumin 3.3 (L) 3.5 - 5.0 g/dL   AST 14 (L) 15 - 41 U/L   ALT 11 (L) 17 - 63 U/L   Alkaline Phosphatase 69 38 - 126 U/L   Total Bilirubin 0.3 0.3 - 1.2 mg/dL   GFR calc non Af Amer >60 >60 mL/min   GFR calc Af Amer >60 >60 mL/min    Comment: (NOTE) The eGFR has been calculated using the CKD EPI equation. This calculation has not been validated in all clinical situations. eGFR's persistently <60 mL/min signify possible Chronic Kidney Disease.    Anion gap 7 5 - 15    Comment: Performed at [facility], [place] [address] [place], [place] [address]  Protime-INR     Status: None   Collection Time: 08/16/17  2:54 PM  Result Value Ref Range   Prothrombin Time 13.6 11.4 - 15.2 seconds   INR 1.05     Comment: Performed at [facility], [place] [address], [place], [place] [address]  Surgical pcr screen     Status: None   Collection Time: 08/16/17  2:54 PM  Result Value Ref Range   MRSA, PCR NEGATIVE NEGATIVE    Staphylococcus aureus NEGATIVE NEGATIVE    Comment: (NOTE) The Xpert SA Assay (FDA approved for NASAL specimens in patients 64 years of age and older), is one component of a comprehensive surveillance program. It is not intended to diagnose infection nor to guide or monitor treatment. Performed at [facility], [place] [address], [place],  [address]   Hemoglobin A1c     Status: Abnormal   Collection Time: 08/16/17  2:54 PM  Result Value Ref Range   Hgb A1c MFr Bld 5.8 (H) 4.8 - 5.6 %    Comment: (NOTE) Pre diabetes:          5.7%-6.4% Diabetes:              >6.4% Glycemic control for   <7.0% adults with diabetes    Mean Plasma Glucose 119.76 mg/dL    Comment: Performed at [place] [address] [place],  [address]     Estimated body mass index is 28.85 kg/m as calculated from the following:   Height as of 08/16/17: '5\' 5"'$  (1.651 m).   Weight as of 08/16/17: 78.6 kg (173 lb 6 oz).   Imaging Review Plain radiographs demonstrate severe degenerative  joint disease of the left knee(s). The overall alignment ismild varus. The bone quality appears to be fair for age and reported activity level.  Assessment/Plan:  End stage arthritis, left knee   The patient history, physical examination, clinical judgment of the provider and imaging studies are consistent with end stage degenerative joint disease of the left knee(s) and total knee arthroplasty is deemed medically necessary. The treatment options including medical management, injection therapy arthroscopy and arthroplasty were discussed at length. The risks and benefits of total knee arthroplasty were presented and reviewed. The risks due to aseptic loosening, infection, stiffness, patella tracking problems, thromboembolic complications and other imponderables were discussed. The patient acknowledged the explanation, agreed to proceed with the plan and consent was signed. Patient is being admitted for  inpatient treatment for surgery, pain control, PT, OT, prophylactic antibiotics, VTE prophylaxis, progressive ambulation and ADL's and discharge planning. The patient is planning to be discharged home with home health services

## 2017-08-24 DIAGNOSIS — M1712 Unilateral primary osteoarthritis, left knee: Secondary | ICD-10-CM | POA: Diagnosis not present

## 2017-08-24 LAB — CBC
HEMATOCRIT: 29.6 % — AB (ref 39.0–52.0)
HEMOGLOBIN: 9.2 g/dL — AB (ref 13.0–17.0)
MCH: 25.2 pg — ABNORMAL LOW (ref 26.0–34.0)
MCHC: 31.1 g/dL (ref 30.0–36.0)
MCV: 81.1 fL (ref 78.0–100.0)
Platelets: 236 10*3/uL (ref 150–400)
RBC: 3.65 MIL/uL — ABNORMAL LOW (ref 4.22–5.81)
RDW: 14.3 % (ref 11.5–15.5)
WBC: 11.3 10*3/uL — AB (ref 4.0–10.5)

## 2017-08-24 LAB — BASIC METABOLIC PANEL
ANION GAP: 12 (ref 5–15)
BUN: 11 mg/dL (ref 6–20)
CALCIUM: 9 mg/dL (ref 8.9–10.3)
CHLORIDE: 100 mmol/L — AB (ref 101–111)
CO2: 24 mmol/L (ref 22–32)
CREATININE: 0.71 mg/dL (ref 0.61–1.24)
GFR calc non Af Amer: >60 mL/min (ref >60)
Glucose, Bld: 211 mg/dL — ABNORMAL HIGH (ref 65–99)
Potassium: 4.2 mmol/L (ref 3.5–5.1)
SODIUM: 136 mmol/L (ref 135–145)

## 2017-08-24 LAB — GLUCOSE, CAPILLARY: GLUCOSE-CAPILLARY: 181 mg/dL — AB (ref 65–99)

## 2017-08-24 NOTE — Progress Notes (Addendum)
Physical Therapy Treatment Patient Details Name: Peter SchlatterChester K Roth MRN: [id number] DOB: 06-29-1961 Today's Date: 08/24/2017    History of Present Illness Pt s/p L TKR and with hx of recent R TKR and bipolar    PT Comments    Pt progressing well and eager to return home.  Pt reports increased pain.  Reviewed gait and progressed to step through pattern.  Stair training performed to simulate safe entry into home.  Plan next session for continued gait and exercises.  Pt remains appropriate to return home with HHPT.  HEP issued.      Follow Up Recommendations  Home health PT     Equipment Recommendations  None recommended by PT    Recommendations for Other Services       Precautions / Restrictions Precautions Precautions: Knee;Fall Required Braces or Orthoses: Knee Immobilizer - Left Knee Immobilizer - Left: Discontinue once straight leg raise with < 10 degree lag Restrictions Weight Bearing Restrictions: No Other Position/Activity Restrictions: WBAT    Mobility  Bed Mobility               General bed mobility comments: Pt sitting in recliner on arrival.    Transfers Overall transfer level: Needs assistance Equipment used: Rolling walker (2 wheeled) Transfers: Sit to/from Stand Sit to Stand: Supervision         General transfer comment: Cues for hand placement to and from seated surface.  Good eccentric loading observed.    Ambulation/Gait Ambulation/Gait assistance: Min guard Ambulation Distance (Feet): 250 Feet Assistive device: Rolling walker (2 wheeled) Gait Pattern/deviations: Decreased step length - right;Trunk flexed;Step-through pattern;Antalgic Gait velocity: decr Gait velocity interpretation: Below normal speed for age/gender General Gait Details: Cues for increasing right stride length to progress to step through pattern.     Stairs Stairs: Yes   Stair Management: No rails;Backwards;Forwards;With walker Number of Stairs: 2 General stair  comments: Cues for sequencing and RW placement.  Pt required min assist to stabilize and steady RW for support.  Pt performed backward to ascend and forward to descend.    Wheelchair Mobility    Modified Rankin (Stroke Patients Only)       Balance Overall balance assessment: Needs assistance   Sitting balance-Leahy Scale: Normal       Standing balance-Leahy Scale: Fair                              Cognition Arousal/Alertness: Awake/alert Behavior During Therapy: WFL for tasks assessed/performed Overall Cognitive Status: Within Functional Limits for tasks assessed                                        Exercises Total Joint Exercises Ankle Circles/Pumps: AROM;Both;20 reps;Supine Quad Sets: AROM;Left;10 reps;Supine Heel Slides: AROM;Left;10 reps;Supine Hip ABduction/ADduction: AROM;Left;10 reps;Supine Straight Leg Raises: AAROM;Left;10 reps;Supine Long Arc Quad: AROM;Left;10 reps;Supine Goniometric ROM: 10-50 degrees L Knee AROM.      General Comments        Pertinent Vitals/Pain Pain Assessment: 0-10 Pain Score: 10-Worst pain ever Pain Location: L knee Pain Descriptors / Indicators: Aching;Sore Pain Intervention(s): Monitored during session;Repositioned;Patient requesting pain meds-RN notified;RN gave pain meds during session;Ice applied    Home Living                      Prior Function  PT Goals (current goals can now be found in the care plan section) Acute Rehab PT Goals Patient Stated Goal: Regain IND Potential to Achieve Goals: Good Progress towards PT goals: Progressing toward goals    Frequency    7X/week      PT Plan Current plan remains appropriate    Co-evaluation              AM-PAC PT "6 Clicks" Daily Activity  Outcome Measure  Difficulty turning over in bed (including adjusting bedclothes, sheets and blankets)?: Unable Difficulty moving from lying on back to sitting on the side  of the bed? : Unable Difficulty sitting down on and standing up from a chair with arms (e.g., wheelchair, bedside commode, etc,.)?: Unable Help needed moving to and from a bed to chair (including a wheelchair)?: A Little Help needed walking in hospital room?: A Little Help needed climbing 3-5 steps with a railing? : A Little 6 Click Score: 12    End of Session Equipment Utilized During Treatment: Gait belt(did not use L KI, no buckling noted good quad control in stance phase.  ) Activity Tolerance: Patient tolerated treatment well Patient left: with call bell/phone within reach;in bed;with nursing/sitter in room Nurse Communication: Mobility status PT Visit Diagnosis: Difficulty in walking, not elsewhere classified (R26.2)     Time: 7829-5621 PT Time Calculation (min) (ACUTE ONLY): 26 min  Charges:  $Gait Training: 8-22 mins $Therapeutic Exercise: 8-22 mins                    G Codes:       Joycelyn Rua, PTA pager [phone]    Florestine Avers 08/24/2017, 9:57 AM

## 2017-08-24 NOTE — Care Management Note (Signed)
Case Management Note  Patient Details  Name: Peter Roth MRN: [id number] Date of Birth: 05-03-1961  Subjective/Objective:    Left TKA                Action/Plan: [facility] to pt and [facility] Pt requested [facility] for HHPT. Has RW and bedside commode at home. Girlfriend will be at home to assist with care.   Expected Discharge Date:  08/24/17               Expected Discharge Plan:  Home w Home Health Services  In-House Referral:  NA  Discharge planning Services  CM Consult  Post Acute Care Choice:  Home Health Choice offered to:  Patient  DME Arranged:  N/A DME Agency:  NA  HH Arranged:  PT HH Agency:  [facility]  Status of Service:  Completed, signed off  If discussed at Long Length of Stay Meetings, dates discussed:    Additional Comments:  Elliot CousinShavis, Kengo Sturges Ellen, RN 08/24/2017, 11:40 AM

## 2017-08-24 NOTE — Plan of Care (Signed)
Plan of care reviewed and discussed with patient.   

## 2017-08-24 NOTE — Care Management CC44 (Signed)
Condition Code 44 Documentation Completed  Patient Details  Name: Peter Roth MRN: [id number] Date of Birth: May 26, 1961   Condition Code 44 given:  Yes Patient signature on Condition Code 44 notice:  Yes Documentation of 2 MD's agreement:  Yes Code 44 added to claim:  Yes    Elliot CousinShavis, Antar Milks Ellen, RN 08/24/2017, 11:19 AM

## 2017-08-24 NOTE — Progress Notes (Signed)
Discharged from floor via w/c for transport home by car. Belongings & significant other with pt. No changes in assessment. Feliciano Wynter  

## 2017-08-24 NOTE — Care Management Obs Status (Signed)
MEDICARE OBSERVATION STATUS NOTIFICATION   Patient Details  Name: Peter Roth MRN: [id number] Date of Birth: 03/08/1961   Medicare Observation Status Notification Given:  Yes    Elliot CousinShavis, Gerron Guidotti Ellen, RN 08/24/2017, 11:19 AM

## 2017-08-24 NOTE — Discharge Summary (Signed)
Physician Discharge Summary  Patient ID: Peter Roth MRN: [id number] DOB/AGE: 11-08-1960 57 y.o.  Admit date: 08/23/2017 Discharge date: 08/24/2017  Admission Diagnoses:  Primary osteoarthritis of left knee  Discharge Diagnoses:  Principal Problem:   Primary osteoarthritis of left knee   Past Medical History:  Diagnosis Date  . Arthritis   . Bipolar 1 disorder (HCC)   . Hyperlipidemia   . Hypertension   . Stress headaches   . Type 2 diabetes mellitus (HCC)    followed by pcp  . Wears glasses     Surgeries: Procedure(s): LEFT TOTAL KNEE ARTHROPLASTY on 08/23/2017   Consultants (if any):   Discharged Condition: Improved  Hospital Course: Peter Roth is an 57 y.o. male who was admitted 08/23/2017 with a diagnosis of Primary osteoarthritis of left knee and went to the operating room on 08/23/2017 and underwent the above named procedures.    He was given perioperative antibiotics:  Anti-infectives (From admission, onward)   Start     Dose/Rate Route Frequency Ordered Stop   08/23/17 1500  clindamycin (CLEOCIN) IVPB 600 mg     600 mg 100 mL/hr over 30 Minutes Intravenous Every 6 hours 08/23/17 1201 08/23/17 2138   08/23/17 0755  clindamycin (CLEOCIN) IVPB 900 mg     900 mg 100 mL/hr over 30 Minutes Intravenous On call to O.R. 08/23/17 0755 08/23/17 1004    .  He was given sequential compression devices, early ambulation, and ASA  for DVT prophylaxis.  He benefited maximally from the hospital stay and there were no complications.  His calf was soft, intact LT sens P/D/1web, and dressing externally clean and dry.  He was desiring of D/C home with Home PT, and cleared for such by PT.  Recent vital signs:  Vitals:   08/24/17 0458 08/24/17 0900  BP: 138/75 (!) 143/76  Pulse: 75 79  Resp: 20 18  Temp: 98.5 F (36.9 C) 98.1 F (36.7 C)  SpO2: 98% 97%    Recent laboratory studies:  Lab Results  Component Value Date   HGB 9.2 (L) 08/24/2017   HGB 11.1 (L)  08/16/2017   HGB 9.4 (L) 06/09/2017   Lab Results  Component Value Date   WBC 11.3 (H) 08/24/2017   PLT 236 08/24/2017   Lab Results  Component Value Date   INR 1.05 08/16/2017   Lab Results  Component Value Date   NA 136 08/24/2017   K 4.2 08/24/2017   CL 100 (L) 08/24/2017   CO2 24 08/24/2017   BUN 11 08/24/2017   CREATININE 0.71 08/24/2017   GLUCOSE 211 (H) 08/24/2017    Discharge Medications:   Allergies as of 08/24/2017      Reactions   Sulfa Antibiotics Anaphylaxis, Other (See Comments)   Mouth sores    Cephalosporins Swelling   Oral swelling      Medication List    STOP taking these medications   diclofenac 75 MG EC tablet Commonly known as:  VOLTAREN   HYDROcodone-acetaminophen 5-325 MG tablet Commonly known as:  NORCO/VICODIN   ibuprofen 600 MG tablet Commonly known as:  ADVIL,MOTRIN     TAKE these medications   aspirin EC 325 MG tablet Take 1 tablet (325 mg total) by mouth 2 (two) times daily after a meal. Take x 1 month post op to decrease risk of blood clots.   atorvastatin 40 MG tablet Commonly known as:  LIPITOR Take 40 mg by mouth daily.   divalproex 500 MG 24 hr tablet  Commonly known as:  DEPAKOTE ER Take 1,000 mg by mouth at bedtime.   docusate sodium 100 MG capsule Commonly known as:  COLACE Take 1 capsule (100 mg total) by mouth 2 (two) times daily. What changed:    when to take this  reasons to take this   JANUMET XR 50-500 MG Tb24 Generic drug:  SitaGLIPtin-MetFORMIN HCl Take 1 tablet by mouth daily.   lisinopril 10 MG tablet Commonly known as:  PRINIVIL,ZESTRIL Take 10 mg daily by mouth.   omeprazole 40 MG capsule Commonly known as:  PRILOSEC Take 40 mg by mouth daily as needed (acid reflux).   oxyCODONE-acetaminophen 5-325 MG tablet Commonly known as:  PERCOCET/ROXICET Take 1-2 tablets by mouth every 4 (four) hours as needed for severe pain. What changed:  reasons to take this   tiZANidine 2 MG tablet Commonly  known as:  ZANAFLEX Take 1 tablet (2 mg total) by mouth every 8 (eight) hours as needed for muscle spasms.       Diagnostic Studies: Dg Chest 2 View  Result Date: 08/16/2017 CLINICAL DATA:  Preop evaluation. EXAM: CHEST  2 VIEW COMPARISON:  05/27/2017 FINDINGS: The heart size and mediastinal contours are within normal limits. Both lungs are clear. The visualized skeletal structures are unremarkable. IMPRESSION: No active cardiopulmonary disease. Electronically Signed   By: Marlan Palauharles  Clark M.D.   On: 08/16/2017 16:27    Disposition: 06-Home-Health Care Svc  Discharge Instructions    Face-to-face encounter (required for Medicare/Medicaid patients)   Complete by:  As directed    I Jodi Marbleavid A Yul Diana certify that this patient is under my care and that I, or a nurse practitioner or physician's assistant working with me, had a face-to-face encounter that meets the physician face-to-face encounter requirements with this patient on 08/24/2017. The encounter with the patient was in whole, or in part for the following medical condition(s) which is the primary reason for home health care (List medical condition): s/p TKA   The encounter with the patient was in whole, or in part, for the following medical condition, which is the primary reason for home health care:  s/p TKA   I certify that, based on my findings, the following services are medically necessary home health services:  Physical therapy   Reason for Medically Necessary Home Health Services:  Therapy- Instruction on use of Assistive Device for Ambulation on all Surfaces   My clinical findings support the need for the above services:  Unable to leave home safely without assistance and/or assistive device   Further, I certify that my clinical findings support that this patient is homebound due to:  Ambulates short distances less than 300 feet   Home Health   Complete by:  As directed    To provide the following care/treatments:  PT      Follow-up  Information    Jodi GeraldsGraves, John, MD. Schedule an appointment as soon as possible for a visit in 2 week(s).   Specialty:  Orthopedic Surgery Contact information: [address] [place] [address] [phone]            Signed: Jodi MarbleDavid A Terence Googe 08/24/2017, 9:56 AM

## 2017-08-26 ENCOUNTER — Encounter (HOSPITAL_COMMUNITY): Payer: Self-pay | Admitting: Orthopedic Surgery

## 2017-08-26 NOTE — Op Note (Signed)
NAME:  Peter Roth, Peter Roth                  ACCOUNT NO.:  MEDICAL RECORD NO.:  [id number]  LOCATION:                                 FACILITY:  PHYSICIAN:  Harvie Junior, M.D.   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  PRINCIPAL PROCEDURE:  Left total knee replacement with an attune system size 6 femur, size 7 tibia, 5-mm bridging bearing, and a 38 mm all polyethylene patella.  SURGEON:  Harvie Junior, M.D.  [name].  ANESTHESIA:  Spinal.  BRIEF HISTORY:  Mr. Peter Roth is a 57 year old male with a long history and significant complaints of bilateral knee pain.  We have done a right knee replacement on him several months ago.  He did great with that and he was coming for left total knee replacement.  X-ray showed bone-on- bone change.  He was having night pain and light activity pain, and he had failed all conservative care.  He is brought to the operating room for left total knee replacement.  DESCRIPTION OF PROCEDURE:  The patient was brought to the operating room.  After adequate anesthesia was obtained with spinal anesthetic, placed the patient supine on the operating table, left leg was prepped and draped in usual sterile fashion.  Following this, the leg was exsanguinated.  Blood pressure tourniquet was inflated to 300 mmHg. Following this, a midline incision was made and the subcutaneous tissues were dissected down to the level of extensor mechanism and medial parapatellar arthrotomy was undertaken.  Once this was done, medial and lateral meniscus were removed, retropatellar fat pad, synovium on the anterior aspect of the femur, and anterior and posterior cruciates. Attention  was then turned to the femur where an intramedullary pilot hole was drilled and a 4-degree distal valgus inclination cut was made with 9 mm distal bone resected.  Once that was completed, the femur was sized to a 6.  Anterior and posterior cuts were made, chamfers and box. Attention was turned to the tibia.  It was cut perpendicular to its long axis and the tibia was then sized to a 7.  It was drilled and keeled and trials were put in place.  Following this, attention was turned towards placing a bearing and the knee was put through a range of motion. Excellent stability and range of motion were achieved at this point. Attention was then turned towards the patella.  It was cut down to a level 13 mm and a 38 paddle was chosen, lugs were drilled, and the poly was placed and knee put through a range of motion.  Excellent stability and range of motion were achieved at this point, and all trial components were then removed.  At this point, the knee was copiously and thoroughly lavaged with pulsatile lavage, irrigation and suctioned dry.  The final components were then cemented into place size 6 femur, size 7 tibia, 5-mm bridging bearing, a trial was placed and  a 38 all poly patella was placed and held with a clamp.  All excess bone and cement were removed and the cement was allowed to completely harden.  Once the cement was hardened, the tourniquet was let down.  All bleeding was controlled with electrocautery and the trial 5 was removed and the final 5 was opened in place as it seemed to be the appropriate size.  At this point, the medial parapatellar arthrotomy was closed with 1 Vicryl running, skin with 0 and 2-0 Vicryl, and skin with Monocryl subcuticular.  Benzoin and Steri-Strips were applied.  Sterile compressive dressing was applied and the patient was taken to recovery room and was noted to be in satisfactory condition.  Estimated blood loss for procedure is minimal.     Harvie JuniorJohn L.  Llesenia Fogal, M.D.     [name]  D:  08/23/2017  T:  08/23/2017  Job:  [id number]  cc:   Harvie JuniorJohn L. Adama Ferber, M.D. Fax: [phone]

## 2017-09-10 ENCOUNTER — Ambulatory Visit: Payer: Medicare Other | Attending: Orthopedic Surgery | Admitting: Physical Therapy

## 2017-09-10 ENCOUNTER — Encounter: Payer: Self-pay | Admitting: Physical Therapy

## 2017-09-10 DIAGNOSIS — R262 Difficulty in walking, not elsewhere classified: Secondary | ICD-10-CM | POA: Diagnosis present

## 2017-09-10 DIAGNOSIS — M25662 Stiffness of left knee, not elsewhere classified: Secondary | ICD-10-CM | POA: Insufficient documentation

## 2017-09-10 DIAGNOSIS — R600 Localized edema: Secondary | ICD-10-CM | POA: Diagnosis present

## 2017-09-10 DIAGNOSIS — M25562 Pain in left knee: Secondary | ICD-10-CM | POA: Diagnosis not present

## 2017-09-10 NOTE — Therapy (Signed)
[facility] [address] [place], [place], [address] Phone: [phone]   Fax:  [phone]  Physical Therapy Treatment  Patient Details  Name: Peter SchlatterChester K Gasbarro MRN: [id number] Date of Birth: 05/28/1961 Referring Provider: Luiz BlareGraves   Encounter Date: 09/10/2017  PT End of Session - 09/10/17 1031    Visit Number  1    Date for PT Re-Evaluation  11/10/17    PT Start Time  1006    PT Stop Time  1100    PT Time Calculation (min)  54 min    Activity Tolerance  Patient tolerated treatment well    Behavior During Therapy  [facility] for tasks assessed/performed       Past Medical History:  Diagnosis Date  . Arthritis   . Bipolar 1 disorder (HCC)   . Hyperlipidemia   . Hypertension   . Stress headaches   . Type 2 diabetes mellitus (HCC)    followed by pcp  . Wears glasses     Past Surgical History:  Procedure Laterality Date  . KNEE ARTHROSCOPY Right ?date  . TOTAL KNEE ARTHROPLASTY Right 06/07/2017   Procedure: RIGHT TOTAL KNEE ARTHROPLASTY, INJECTION LEFT KNEE WITH FLUID ASPIRATION;  Surgeon: Jodi GeraldsGraves, John, MD;  Location: [facility];  Service: Orthopedics;  Laterality: Right;  . TOTAL KNEE ARTHROPLASTY Left 08/23/2017   Procedure: LEFT TOTAL KNEE ARTHROPLASTY;  Surgeon: Jodi GeraldsGraves, John, MD;  Location: [facility];  Service: Orthopedics;  Laterality: Left;    There were no vitals filed for this visit.  Subjective Assessment - 09/10/17 1007    Subjective  Patient had a left TKR on 08/23/17.  He had his right knee replaced last year and did very well.  He had home PT until last week.  He reports that it is stiff    Limitations  Walking    Patient Stated Goals  have less pain, walk better    Currently in Pain?  Yes    Pain Score  2     Pain Location  Knee    Pain Orientation  Left    Pain Descriptors / Indicators  Aching;Tightness    Pain Type  Acute pain;Surgical pain    Pain Onset  1 to 4 weeks ago    Pain Frequency  Intermittent     Aggravating Factors    walking standing and bending, pain can be 6/10    Pain Relieving Factors  rest, ice    Effect of Pain on Daily Activities  walking         [facility] - 09/10/17 0001      Assessment   Medical Diagnosis  left TKR    Referring Provider  Graves    Onset Date/Surgical Date  08/23/17    Prior Therapy  home PT      Precautions   Precautions  None      Balance Screen   Has the patient fallen in the past 6 months  No    Has the patient had a decrease in activity level because of a fear of falling?   No    Is the patient reluctant to leave their home because of a fear of falling?   No      Home Environment   Additional Comments  no stairs, did housework, did yardwork      Prior Function   Level of Independence  Independent    Vocation  On disability    Leisure  no  exercise      Circumferential Edema   Circumferential - Right  41.5 cm    Circumferential - Left   46cm      AROM   Right/Left Knee  Left    Left Knee Extension  22    Left Knee Flexion  89      PROM   Right/Left Knee  Left    Left Knee Extension  16 tight posterior knee    Left Knee Flexion  93      Strength   Overall Strength Comments  4-/5 with some pain      Palpation   Palpation comment  some pitting edema, some scar puckering      Ambulation/Gait   Gait Comments  no device, stiff left leg, antalgic on the left      Standardized Balance Assessment   Standardized Balance Assessment  Timed Up and Go Test      Timed Up and Go Test   Normal TUG (seconds)  18                  [facility] Adult PT Treatment/Exercise - 09/10/17 0001      Knee/Hip Exercises: Aerobic   Recumbent Bike  5 minutes partial revolutions    Nustep  Level 4 x 6 minutes      Vasopneumatic   Number Minutes Vasopneumatic   15 minutes    Vasopnuematic Location   Knee    Vasopneumatic Pressure  Medium    Vasopneumatic Temperature   35               PT Short Term Goals -  09/10/17 1037      PT SHORT TERM GOAL #1   Title  assure independent with HEP    Time  2    Period  Weeks    Status  New        PT Long Term Goals - 09/10/17 1037      PT LONG TERM GOAL #1   Title  increase AROM to 5-114 degrees flexion    Time  8    Period  Weeks    Status  New      PT LONG TERM GOAL #2   Title  walk without deviation all community distances    Time  8    Period  Weeks    Status  New      PT LONG TERM GOAL #3   Title  decrease pain 50%    Time  8    Period  Days    Status  New      PT LONG TERM GOAL #4   Title  go up and down stairs step over step    Time  8    Period  Weeks    Status  New            Plan - 09/10/17 1032    Clinical Impression Statement  Patient with a left TKR on 08/23/17, he had the right TKR in November.  He has significant swelling, he is tight in the knee with ROM.  AROM was 22-88 degrees flexion with pain.  Stiff and antalgic gait on the left    Clinical Presentation  Evolving    Clinical Decision Making  Low    PT Frequency  3x / week    PT Duration  8 weeks    PT Treatment/Interventions  ADLs/Self Care Home Management;Cryotherapy;Electrical Stimulation;Gait training;Neuromuscular re-education;Balance training;Therapeutic exercise;Therapeutic activities;Functional mobility training;Stair  training;Patient/family education;Manual techniques;Vasopneumatic Device    PT Next Visit Plan  slowly work on gait, ROM and function    Consulted and Agree with Plan of Care  Patient       Patient will benefit from skilled therapeutic intervention in order to improve the following deficits and impairments:  Abnormal gait, Decreased activity tolerance, Decreased balance, Decreased mobility, Decreased strength, Increased edema, Impaired flexibility, Pain, Cardiopulmonary status limiting activity, Decreased endurance, Decreased range of motion, Difficulty walking  Visit Diagnosis: Acute pain of left knee - Plan: PT plan of care  cert/re-cert  Stiffness of left knee, not elsewhere classified - Plan: PT plan of care cert/re-cert  Difficulty in walking, not elsewhere classified - Plan: PT plan of care cert/re-cert  Localized edema - Plan: PT plan of care cert/re-cert     Problem List Patient Active Problem List   Diagnosis Date Noted  . Primary osteoarthritis of right knee 06/07/2017  . Primary osteoarthritis of left knee 06/07/2017  . Effusion, left knee 06/07/2017    Jearld Lesch., PT 09/10/2017, 10:47 AM  [facility] [address] [place], [place], [address] Phone: [phone]   Fax:  [phone]  Name: TREMAINE EARWOOD MRN: [id number] Date of Birth: [birth year]

## 2017-09-12 ENCOUNTER — Ambulatory Visit: Payer: Medicare Other | Admitting: Physical Therapy

## 2017-09-12 DIAGNOSIS — M25562 Pain in left knee: Secondary | ICD-10-CM

## 2017-09-12 DIAGNOSIS — R262 Difficulty in walking, not elsewhere classified: Secondary | ICD-10-CM

## 2017-09-12 DIAGNOSIS — R600 Localized edema: Secondary | ICD-10-CM

## 2017-09-12 DIAGNOSIS — M25662 Stiffness of left knee, not elsewhere classified: Secondary | ICD-10-CM

## 2017-09-12 NOTE — Therapy (Signed)
[facility] [address] [place], [place], [address] Phone: [phone]   Fax:  [phone]  Physical Therapy Treatment  Patient Details  Name: Peter SchlatterChester K Dannemiller MRN: [id number] Date of Birth: Dec 31, 1960 Referring Provider: Luiz BlareGraves   Encounter Date: 09/12/2017  PT End of Session - 09/12/17 1021    Visit Number  2    Date for PT Re-Evaluation  11/10/17    PT Start Time  0930    PT Stop Time  1035    PT Time Calculation (min)  65 min       Past Medical History:  Diagnosis Date  . Arthritis   . Bipolar 1 disorder (HCC)   . Hyperlipidemia   . Hypertension   . Stress headaches   . Type 2 diabetes mellitus (HCC)    followed by pcp  . Wears glasses     Past Surgical History:  Procedure Laterality Date  . KNEE ARTHROSCOPY Right ?date  . TOTAL KNEE ARTHROPLASTY Right 06/07/2017   Procedure: RIGHT TOTAL KNEE ARTHROPLASTY, INJECTION LEFT KNEE WITH FLUID ASPIRATION;  Surgeon: Jodi GeraldsGraves, John, MD;  Location: [facility];  Service: Orthopedics;  Laterality: Right;  . TOTAL KNEE ARTHROPLASTY Left 08/23/2017   Procedure: LEFT TOTAL KNEE ARTHROPLASTY;  Surgeon: Jodi GeraldsGraves, John, MD;  Location: [facility];  Service: Orthopedics;  Laterality: Left;    There were no vitals filed for this visit.  Subjective Assessment - 09/12/17 0946    Subjective  this weather has me hurting. amb in on toes with knee flex on Left    Currently in Pain?  Yes    Pain Score  5     Pain Location  Knee    Pain Orientation  Left                      [facility] Adult PT Treatment/Exercise - 09/12/17 0001      Knee/Hip Exercises: Aerobic   Elliptical  2 fwd/2 back    Recumbent Bike  6 min partial rev    Nustep  Level 4 x 6 minutes      Knee/Hip Exercises: Machines for Strengthening   Cybex Knee Flexion  20# 2 sets 10    Cybex Leg Press  30# 3 sets 10      Knee/Hip Exercises: Standing   Heel Raises  Both;20 reps black bar    Terminal Knee  Extension  Strengthening;Left;20 reps ball on wall      Knee/Hip Exercises: Seated   Other Seated Knee/Hip Exercises  TKE 3 sets 10 red tband Vcing and tactile for quad activation      Vasopneumatic   Number Minutes Vasopneumatic   15 minutes    Vasopnuematic Location   Knee    Vasopneumatic Pressure  Medium    Vasopneumatic Temperature   35      Manual Therapy   Manual Therapy  Joint mobilization;Soft tissue mobilization;Passive ROM    Joint Mobilization  pat and jt capsule    Soft tissue mobilization  quad    Passive ROM  flex and ext               PT Short Term Goals - 09/10/17 1037      PT SHORT TERM GOAL #1   Title  assure independent with HEP    Time  2    Period  Weeks    Status  New        PT Long Term  Goals - 09/10/17 1037      PT LONG TERM GOAL #1   Title  increase AROM to 5-114 degrees flexion    Time  8    Period  Weeks    Status  New      PT LONG TERM GOAL #2   Title  walk without deviation all community distances    Time  8    Period  Weeks    Status  New      PT LONG TERM GOAL #3   Title  decrease pain 50%    Time  8    Period  Days    Status  New      PT LONG TERM GOAL #4   Title  go up and down stairs step over step    Time  8    Period  Weeks    Status  New            Plan - 09/12/17 1022    Clinical Impression Statement  cuing with gait to heel strike and ext knee vs amb on toes with flexion. tightness in quads and cuing to relax with MT. cuing to activate quads with TKE    PT Treatment/Interventions  ADLs/Self Care Home Management;Cryotherapy;Electrical Stimulation;Gait training;Neuromuscular re-education;Balance training;Therapeutic exercise;Therapeutic activities;Functional mobility training;Stair training;Patient/family education;Manual techniques;Vasopneumatic Device    PT Next Visit Plan  gait/ROM/strengthen and func       Patient will benefit from skilled therapeutic intervention in order to improve the following  deficits and impairments:  Abnormal gait, Decreased activity tolerance, Decreased balance, Decreased mobility, Decreased strength, Increased edema, Impaired flexibility, Pain, Cardiopulmonary status limiting activity, Decreased endurance, Decreased range of motion, Difficulty walking  Visit Diagnosis: Acute pain of left knee  Stiffness of left knee, not elsewhere classified  Difficulty in walking, not elsewhere classified  Localized edema     Problem List Patient Active Problem List   Diagnosis Date Noted  . Primary osteoarthritis of right knee 06/07/2017  . Primary osteoarthritis of left knee 06/07/2017  . Effusion, left knee 06/07/2017    PAYSEUR,ANGIE PTA 09/12/2017, 10:34 AM  [facility] [address] [place], [place], [address] Phone: [phone]   Fax:  [phone]  Name: JOSELUIS ALESSIO MRN: [id number] Date of Birth: [birth year]

## 2017-09-17 ENCOUNTER — Ambulatory Visit: Payer: Medicare Other | Admitting: Physical Therapy

## 2017-09-17 DIAGNOSIS — M25562 Pain in left knee: Secondary | ICD-10-CM

## 2017-09-17 DIAGNOSIS — R262 Difficulty in walking, not elsewhere classified: Secondary | ICD-10-CM

## 2017-09-17 DIAGNOSIS — M25662 Stiffness of left knee, not elsewhere classified: Secondary | ICD-10-CM

## 2017-09-17 DIAGNOSIS — R600 Localized edema: Secondary | ICD-10-CM

## 2017-09-17 NOTE — Therapy (Signed)
[address] [place], [place], [address] Phone: [phone]   Fax:  [phone]  Physical Therapy Treatment  Patient Details  Name: Peter Roth MRN: [id number] Date of Birth: 1960-09-08 Referring Provider: Berenice Primas   Encounter Date: 09/17/2017  PT End of Session - 09/17/17 0842    Visit Number  3    Date for PT Re-Evaluation  11/10/17    PT Start Time  0810    PT Stop Time  0910    PT Time Calculation (min)  60 min       Past Medical History:  Diagnosis Date  . Arthritis   . Bipolar 1 disorder ([place])   . Hyperlipidemia   . Hypertension   . Stress headaches   . Type 2 diabetes mellitus ([place])    followed by pcp  . Wears glasses     Past Surgical History:  Procedure Laterality Date  . KNEE ARTHROSCOPY Right ?date  . TOTAL KNEE ARTHROPLASTY Right 06/07/2017   Procedure: RIGHT TOTAL KNEE ARTHROPLASTY, INJECTION LEFT KNEE WITH FLUID ASPIRATION;  Surgeon: Dorna Leitz, MD;  Location: [facility];  Service: Orthopedics;  Laterality: Right;  . TOTAL KNEE ARTHROPLASTY Left 08/23/2017   Procedure: LEFT TOTAL KNEE ARTHROPLASTY;  Surgeon: Dorna Leitz, MD;  Location: [facility];  Service: Orthopedics;  Laterality: Left;    There were no vitals filed for this visit.  Subjective Assessment - 09/17/17 0812    Subjective  doing ex at home, still tight and giving me a fit. amb with heel stike but still lacking ext    Currently in Pain?  Yes    Pain Score  5     Pain Location  Knee    Pain Orientation  Left         [facility] PT Assessment - 09/17/17 0001      AROM   Right/Left Knee  Left    Left Knee Extension  8    Left Knee Flexion  103                  [facility] Adult PT Treatment/Exercise - 09/17/17 0001      Knee/Hip Exercises: Stretches   Other Knee/Hip Stretches  prostretch      Knee/Hip Exercises: Aerobic   Recumbent Bike  6 min full rev    Nustep  Level 5 x 6 minutes      Knee/Hip  Exercises: Machines for Strengthening   Cybex Knee Extension  5# left only 4 sets 5    Cybex Knee Flexion  20# 2 sets 10    Cybex Leg Press  30# 3 sets 10 calf raises 30# 2 sets 15      Vasopneumatic   Number Minutes Vasopneumatic   15 minutes    Vasopnuematic Location   Knee    Vasopneumatic Pressure  Medium    Vasopneumatic Temperature   35      Manual Therapy   Manual Therapy  Joint mobilization;Soft tissue mobilization;Passive ROM    Joint Mobilization  pat and jt capsule    Soft tissue mobilization  quad    Passive ROM  flex and ext               PT Short Term Goals - 09/17/17 0843      PT SHORT TERM GOAL #1   Title  assure independent with HEP    Status  Achieved        PT Long  Term Goals - 09/10/17 1037      PT LONG TERM GOAL #1   Title  increase AROM to 5-114 degrees flexion    Time  8    Period  Weeks    Status  New      PT LONG TERM GOAL #2   Title  walk without deviation all community distances    Time  8    Period  Weeks    Status  New      PT LONG TERM GOAL #3   Title  decrease pain 50%    Time  8    Period  Days    Status  New      PT LONG TERM GOAL #4   Title  go up and down stairs step over step    Time  8    Period  Weeks    Status  New            Plan - 09/17/17 1610    Clinical Impression Statement  ROM is increasing, pt needs cues to increase ext and activate quad. STG met       Patient will benefit from skilled therapeutic intervention in order to improve the following deficits and impairments:     Visit Diagnosis: Acute pain of left knee  Stiffness of left knee, not elsewhere classified  Difficulty in walking, not elsewhere classified  Localized edema     Problem List Patient Active Problem List   Diagnosis Date Noted  . Primary osteoarthritis of right knee 06/07/2017  . Primary osteoarthritis of left knee 06/07/2017  . Effusion, left knee 06/07/2017    Sufian Ravi,ANGIE PTA 09/17/2017, 8:44 AM  [place] [address] [address] [place], [place], [address] Phone: [phone]   Fax:  [phone]  Name: Peter Roth MRN: [id number] Date of Birth: 1960-09-11

## 2017-09-19 ENCOUNTER — Ambulatory Visit: Payer: Medicare Other | Admitting: Physical Therapy

## 2017-09-19 DIAGNOSIS — M25562 Pain in left knee: Secondary | ICD-10-CM | POA: Diagnosis not present

## 2017-09-19 DIAGNOSIS — M25662 Stiffness of left knee, not elsewhere classified: Secondary | ICD-10-CM

## 2017-09-19 DIAGNOSIS — R600 Localized edema: Secondary | ICD-10-CM

## 2017-09-19 DIAGNOSIS — R262 Difficulty in walking, not elsewhere classified: Secondary | ICD-10-CM

## 2017-09-19 NOTE — Therapy (Signed)
[facility] [address] [facility] [address] [place], [place], [address] Phone: [phone]   Fax:  [phone]  Physical Therapy Treatment  Patient Details  Name: Peter Roth MRN: [id number] Date of Birth: 1960/09/27 Referring Provider: Luiz Blare   Encounter Date: 09/19/2017  PT End of Session - 09/19/17 1047    Visit Number  4    Date for PT Re-Evaluation  11/10/17    PT Start Time  1015    PT Stop Time  1115    PT Time Calculation (min)  60 min       Past Medical History:  Diagnosis Date  . Arthritis   . Bipolar 1 disorder ([facility])   . Hyperlipidemia   . Hypertension   . Stress headaches   . Type 2 diabetes mellitus ([facility])    followed by pcp  . Wears glasses     Past Surgical History:  Procedure Laterality Date  . KNEE ARTHROSCOPY Right ?date  . TOTAL KNEE ARTHROPLASTY Right 06/07/2017   Procedure: RIGHT TOTAL KNEE ARTHROPLASTY, INJECTION LEFT KNEE WITH FLUID ASPIRATION;  Surgeon: Jodi Geralds, MD;  [facility];  Service: Orthopedics;  Laterality: Right;  . TOTAL KNEE ARTHROPLASTY Left 08/23/2017   Procedure: LEFT TOTAL KNEE ARTHROPLASTY;  Surgeon: Jodi Geralds, MD;  [facility];  Service: Orthopedics;  Laterality: Left;    There were no vitals filed for this visit.  Subjective Assessment - 09/19/17 1017    Subjective  muscle is so sore ( pointing to quad) worked in yard yesterday    Currently in Pain?  Yes    Pain Score  4     Pain Location  Knee    Pain Orientation  Left                      [facility] Adult PT Treatment/Exercise - 09/19/17 0001      Knee/Hip Exercises: Aerobic   Recumbent Bike  6 min full rev    Nustep  Level 5 x 6 minutes LE only      Knee/Hip Exercises: Machines for Strengthening   Cybex Knee Extension  5# left only 4 sets 5    Cybex Knee Flexion  20# 3 sets 10    Cybex Leg Press  30# 3 sets 10      Knee/Hip Exercises: Standing   Heel Raises  Both;20 reps black bar       Modalities   Modalities  Electrical Stimulation      Electrical Stimulation   Electrical Stimulation Location  left knee    Electrical Stimulation Action  IFC    Electrical Stimulation Parameters  supine    Electrical Stimulation Goals  Edema;Pain      Vasopneumatic   Number Minutes Vasopneumatic   15 minutes    Vasopnuematic Location   Knee    Vasopneumatic Pressure  Medium    Vasopneumatic Temperature   35      Manual Therapy   Manual Therapy  Joint mobilization;Soft tissue mobilization;Passive ROM    Joint Mobilization  belt mobs    Soft tissue mobilization  quad    Passive ROM  flex and ext               PT Short Term Goals - 09/17/17 0843      PT SHORT TERM GOAL #1   Title  assure independent with HEP    Status  Achieved  PT Long Term Goals - 09/10/17 1037      PT LONG TERM GOAL #1   Title  increase AROM to 5-114 degrees flexion    Time  8    Period  Weeks    Status  New      PT LONG TERM GOAL #2   Title  walk without deviation all community distances    Time  8    Period  Weeks    Status  New      PT LONG TERM GOAL #3   Title  decrease pain 50%    Time  8    Period  Days    Status  New      PT LONG TERM GOAL #4   Title  go up and down stairs step over step    Time  8    Period  Weeks    Status  New            Plan - 09/19/17 1047    Clinical Impression Statement  pt very sore today esp in quad, toelrated STW fair but felt better and reports not as stiff after- cautioned pt about overdoing at home. Pt tol ex well and overall reports feeling better at end of session    PT Treatment/Interventions  ADLs/Self Care Home Management;Cryotherapy;Electrical Stimulation;Gait training;Neuromuscular re-education;Balance training;Therapeutic exercise;Therapeutic activities;Functional mobility training;Stair training;Patient/family education;Manual techniques;Vasopneumatic Device    PT Next Visit Plan  gait/ROM/strengthen and func        Patient will benefit from skilled therapeutic intervention in order to improve the following deficits and impairments:  Abnormal gait, Decreased activity tolerance, Decreased balance, Decreased mobility, Decreased strength, Increased edema, Impaired flexibility, Pain, Cardiopulmonary status limiting activity, Decreased endurance, Decreased range of motion, Difficulty walking  Visit Diagnosis: Acute pain of left knee  Stiffness of left knee, not elsewhere classified  Difficulty in walking, not elsewhere classified  Localized edema     Problem List Patient Active Problem List   Diagnosis Date Noted  . Primary osteoarthritis of right knee 06/07/2017  . Primary osteoarthritis of left knee 06/07/2017  . Effusion, left knee 06/07/2017    Torey Regan,ANGIE PTA 09/19/2017, 10:49 AM  [facility] [address] [place], [place], [address] Phone: [phone]   Fax:  [phone]  Name: Garth SchlatterChester K Stankowski MRN: [id number] Date of Birth: 1961-04-30

## 2017-09-24 ENCOUNTER — Ambulatory Visit: Payer: Medicare Other | Admitting: Physical Therapy

## 2017-09-25 ENCOUNTER — Ambulatory Visit: Payer: Medicare Other | Admitting: Physical Therapy

## 2017-09-25 ENCOUNTER — Encounter: Payer: Self-pay | Admitting: Physical Therapy

## 2017-09-25 DIAGNOSIS — M25662 Stiffness of left knee, not elsewhere classified: Secondary | ICD-10-CM

## 2017-09-25 DIAGNOSIS — M25562 Pain in left knee: Secondary | ICD-10-CM

## 2017-09-25 DIAGNOSIS — R600 Localized edema: Secondary | ICD-10-CM

## 2017-09-25 DIAGNOSIS — R262 Difficulty in walking, not elsewhere classified: Secondary | ICD-10-CM

## 2017-09-25 NOTE — Therapy (Signed)
[facility] [address] [facility] [place], [place], [address] Phone: [phone]   Fax:  [phone]  Physical Therapy Treatment  Patient Details  Name: Peter Roth MRN: [id number] Date of Birth: June 07, 1961 Referring Provider: Luiz Blare   Encounter Date: 09/25/2017  PT End of Session - 09/25/17 1014    Visit Number  5    Date for PT Re-Evaluation  11/10/17    PT Start Time  0934    PT Stop Time  1029    PT Time Calculation (min)  55 min    Activity Tolerance  Patient tolerated treatment well    Behavior During Therapy  [facility] for tasks assessed/performed       Past Medical History:  Diagnosis Date  . Arthritis   . Bipolar 1 disorder ([facility])   . Hyperlipidemia   . Hypertension   . Stress headaches   . Type 2 diabetes mellitus ([facility])    followed by pcp  . Wears glasses     Past Surgical History:  Procedure Laterality Date  . KNEE ARTHROSCOPY Right ?date  . TOTAL KNEE ARTHROPLASTY Right 06/07/2017   Procedure: RIGHT TOTAL KNEE ARTHROPLASTY, INJECTION LEFT KNEE WITH FLUID ASPIRATION;  Surgeon: Jodi Geralds, MD;  [facility];  Service: Orthopedics;  Laterality: Right;  . TOTAL KNEE ARTHROPLASTY Left 08/23/2017   Procedure: LEFT TOTAL KNEE ARTHROPLASTY;  Surgeon: Jodi Geralds, MD;  [facility];  Service: Orthopedics;  Laterality: Left;    There were no vitals filed for this visit.  Subjective Assessment - 09/25/17 0937    Subjective  "Very little pain, just stiff"    Currently in Pain?  Yes    Pain Score  1     Pain Orientation  Left         [facility] PT Assessment - 09/25/17 0001      AROM   Right/Left Knee  Left    Left Knee Extension  5    Left Knee Flexion  109                  [facility] Adult PT Treatment/Exercise - 09/25/17 0001      Knee/Hip Exercises: Aerobic   Recumbent Bike  6 min full rev    Nustep  L4 x4 min no hands      Knee/Hip Exercises: Machines for Strengthening   Cybex  Knee Extension  10lb 2x10, SL5lb 2x5 each     Cybex Knee Flexion  20# 2 sets 15    Cybex Leg Press  30# 2 sets 15, SL TKE 20lb x10 each       Knee/Hip Exercises: Standing   Heel Raises  Both;20 reps      Modalities   Modalities  Cryotherapy;Vasopneumatic      Cryotherapy   Number Minutes Cryotherapy  15 Minutes    Cryotherapy Location  Knee    Type of Cryotherapy  Ice pack      Vasopneumatic   Number Minutes Vasopneumatic   15 minutes    Vasopnuematic Location   Knee    Vasopneumatic Pressure  Medium    Vasopneumatic Temperature   35      Manual Therapy   Manual Therapy  Joint mobilization;Soft tissue mobilization;Passive ROM    Joint Mobilization  belt mobs    Soft tissue mobilization  quad    Passive ROM  flex and ext               PT  Short Term Goals - 09/17/17 0843      PT SHORT TERM GOAL #1   Title  assure independent with HEP    Status  Achieved        PT Long Term Goals - 09/10/17 1037      PT LONG TERM GOAL #1   Title  increase AROM to 5-114 degrees flexion    Time  8    Period  Weeks    Status  New      PT LONG TERM GOAL #2   Title  walk without deviation all community distances    Time  8    Period  Weeks    Status  New      PT LONG TERM GOAL #3   Title  decrease pain 50%    Time  8    Period  Days    Status  New      PT LONG TERM GOAL #4   Title  go up and down stairs step over step    Time  8    Period  Weeks    Status  New            Plan - 09/25/17 1014    Clinical Impression Statement  Pt ~4 minutes late for today's session. Pt with mild progression with L knee AROM. Pt does well with all exercises, SL extension are difficult but pt able to complete. Positive response to MT.     Rehab Potential  Good    PT Frequency  3x / week    PT Treatment/Interventions  ADLs/Self Care Home Management;Cryotherapy;Electrical Stimulation;Gait training;Neuromuscular re-education;Balance training;Therapeutic exercise;Therapeutic  activities;Functional mobility training;Stair training;Patient/family education;Manual techniques;Vasopneumatic Device    PT Next Visit Plan  gait/ROM/strengthen and func       Patient will benefit from skilled therapeutic intervention in order to improve the following deficits and impairments:  Abnormal gait, Decreased activity tolerance, Decreased balance, Decreased mobility, Decreased strength, Increased edema, Impaired flexibility, Pain, Cardiopulmonary status limiting activity, Decreased endurance, Decreased range of motion, Difficulty walking  Visit Diagnosis: Acute pain of left knee  Localized edema  Difficulty in walking, not elsewhere classified  Stiffness of left knee, not elsewhere classified     Problem List Patient Active Problem List   Diagnosis Date Noted  . Primary osteoarthritis of right knee 06/07/2017  . Primary osteoarthritis of left knee 06/07/2017  . Effusion, left knee 06/07/2017    Grayce Sessionsonald G Margie Brink, PTA 09/25/2017, 10:17 AM  [facility] [address] [place], [place], [address] Phone: [phone]   Fax:  [phone]  Name: Peter Roth MRN: [id number] Date of Birth: August 07, 1960

## 2017-09-26 ENCOUNTER — Ambulatory Visit: Payer: Medicare Other | Admitting: Physical Therapy

## 2017-09-26 DIAGNOSIS — R262 Difficulty in walking, not elsewhere classified: Secondary | ICD-10-CM

## 2017-09-26 DIAGNOSIS — R600 Localized edema: Secondary | ICD-10-CM

## 2017-09-26 DIAGNOSIS — M25662 Stiffness of left knee, not elsewhere classified: Secondary | ICD-10-CM

## 2017-09-26 DIAGNOSIS — M25562 Pain in left knee: Secondary | ICD-10-CM | POA: Diagnosis not present

## 2017-09-26 NOTE — Therapy (Signed)
[place] [address] [place], [place], [address] Phone: [phone]   Fax:  [phone]  Physical Therapy Treatment  Patient Details  Name: Peter Roth MRN: [id number] Date of Birth: 07/12/1960 Referring Provider: Berenice Primas   Encounter Date: 09/26/2017  PT End of Session - 09/26/17 1026    Visit Number  6    Date for PT Re-Evaluation  11/10/17    PT Start Time  0950    PT Stop Time  9233    PT Time Calculation (min)  55 min       Past Medical History:  Diagnosis Date  . Arthritis   . Bipolar 1 disorder ([place])   . Hyperlipidemia   . Hypertension   . Stress headaches   . Type 2 diabetes mellitus ([place])    followed by pcp  . Wears glasses     Past Surgical History:  Procedure Laterality Date  . KNEE ARTHROSCOPY Right ?date  . TOTAL KNEE ARTHROPLASTY Right 06/07/2017   Procedure: RIGHT TOTAL KNEE ARTHROPLASTY, INJECTION LEFT KNEE WITH FLUID ASPIRATION;  Surgeon: Dorna Leitz, MD;  [facility];  Service: Orthopedics;  Laterality: Right;  . TOTAL KNEE ARTHROPLASTY Left 08/23/2017   Procedure: LEFT TOTAL KNEE ARTHROPLASTY;  Surgeon: Dorna Leitz, MD;  [facility];  Service: Orthopedics;  Laterality: Left;    There were no vitals filed for this visit.  Subjective Assessment - 09/26/17 0954    Subjective  hurting this morning with the rain    Currently in Pain?  Yes    Pain Score  4     Pain Location  Knee    Pain Orientation  Left                      [facility] Adult PT Treatment/Exercise - 09/26/17 0001      Knee/Hip Exercises: Aerobic   Recumbent Bike  6 min full rev    Nustep  L 5 7 min LE only      Knee/Hip Exercises: Machines for Strengthening   Cybex Knee Extension  10lb 2x10     Cybex Knee Flexion  20# 2 sets 15    Cybex Leg Press  30# 2 sets 15 30# calf raises for ext      Knee/Hip Exercises: Standing   Terminal Knee Extension  Strengthening;Left;2 sets;10  reps;Theraband    Theraband Level (Terminal Knee Extension)  Level 3 (Green)    Lateral Step Up  15 reps;Hand Hold: 2;Step Height: 6";Left    Forward Step Up  15 reps;Hand Hold: 2;Step Height: 6";Left    Walking with Sports Cord  backward for ext      Vasopneumatic   Number Minutes Vasopneumatic   15 minutes    Vasopnuematic Location   Knee    Vasopneumatic Pressure  Medium    Vasopneumatic Temperature   35      Manual Therapy   Manual Therapy  Joint mobilization;Soft tissue mobilization;Passive ROM    Joint Mobilization  patella    Soft tissue mobilization  quad    Passive ROM  flex and ext               PT Short Term Goals - 09/17/17 0843      PT SHORT TERM GOAL #1   Title  assure independent with HEP    Status  Achieved        PT Long Term Goals - 09/26/17 1015  PT LONG TERM GOAL #1   Title  increase AROM to 5-114 degrees flexion    Status  Partially Met      PT LONG TERM GOAL #2   Title  walk without deviation all community distances    Status  Partially Met      PT LONG TERM GOAL #3   Title  decrease pain 50%    Status  Partially Met      PT LONG TERM GOAL #4   Title  go up and down stairs step over step    Status  Partially Met            Plan - 09/26/17 1026    Clinical Impression Statement  focus on TKE with cuing to activate quad and work to achieve increased ext with all ex. progressing with goals. swelling and warmth noted in left knee. overall tolerated session well    PT Treatment/Interventions  ADLs/Self Care Home Management;Cryotherapy;Electrical Stimulation;Gait training;Neuromuscular re-education;Balance training;Therapeutic exercise;Therapeutic activities;Functional mobility training;Stair training;Patient/family education;Manual techniques;Vasopneumatic Device    PT Next Visit Plan  gait/ROM/strengthen and func       Patient will benefit from skilled therapeutic intervention in order to improve the following deficits and  impairments:  Abnormal gait, Decreased activity tolerance, Decreased balance, Decreased mobility, Decreased strength, Increased edema, Impaired flexibility, Pain, Cardiopulmonary status limiting activity, Decreased endurance, Decreased range of motion, Difficulty walking  Visit Diagnosis: Acute pain of left knee  Localized edema  Difficulty in walking, not elsewhere classified  Stiffness of left knee, not elsewhere classified     Problem List Patient Active Problem List   Diagnosis Date Noted  . Primary osteoarthritis of right knee 06/07/2017  . Primary osteoarthritis of left knee 06/07/2017  . Effusion, left knee 06/07/2017    PAYSEUR,ANGIE PTA 09/26/2017, 10:28 AM  [place] [address] [place], [place], [address] Phone: [phone]   Fax:  [phone]  Name: ADOM SCHOENECK MRN: [id number] Date of Birth: [birth year]

## 2017-10-01 ENCOUNTER — Ambulatory Visit: Payer: Medicare Other | Admitting: Physical Therapy

## 2017-10-01 DIAGNOSIS — M25562 Pain in left knee: Secondary | ICD-10-CM | POA: Diagnosis not present

## 2017-10-01 DIAGNOSIS — R262 Difficulty in walking, not elsewhere classified: Secondary | ICD-10-CM

## 2017-10-01 DIAGNOSIS — R600 Localized edema: Secondary | ICD-10-CM

## 2017-10-01 NOTE — Therapy (Signed)
[place] [address] [place], [place], [address] Phone: [phone]   Fax:  [phone]  Physical Therapy Treatment  Patient Details  Name: Peter Roth MRN: [id number] Date of Birth: 11/29/1960 Referring Provider: Berenice Primas   Encounter Date: 10/01/2017  PT End of Session - 10/01/17 1305    Visit Number  7    Date for PT Re-Evaluation  11/10/17    PT Start Time  1230    PT Stop Time  1330    PT Time Calculation (min)  60 min       Past Medical History:  Diagnosis Date  . Arthritis   . Bipolar 1 disorder ([place])   . Hyperlipidemia   . Hypertension   . Stress headaches   . Type 2 diabetes mellitus ([place])    followed by pcp  . Wears glasses     Past Surgical History:  Procedure Laterality Date  . KNEE ARTHROSCOPY Right ?date  . TOTAL KNEE ARTHROPLASTY Right 06/07/2017   Procedure: RIGHT TOTAL KNEE ARTHROPLASTY, INJECTION LEFT KNEE WITH FLUID ASPIRATION;  Surgeon: Dorna Leitz, MD;  [facility];  Service: Orthopedics;  Laterality: Right;  . TOTAL KNEE ARTHROPLASTY Left 08/23/2017   Procedure: LEFT TOTAL KNEE ARTHROPLASTY;  Surgeon: Dorna Leitz, MD;  [facility];  Service: Orthopedics;  Laterality: Left;    There were no vitals filed for this visit.  Subjective Assessment - 10/01/17 1302    Subjective  pt came in early since GF had a stroke and he needed to get to hospital. so stiff and sore    Currently in Pain?  Yes    Pain Score  4     Pain Location  Knee    Pain Orientation  Left         [facility] PT Assessment - 10/01/17 0001      AROM   Right/Left Knee  Left    Left Knee Extension  5    Left Knee Flexion  112            No data recorded       [facility] Adult PT Treatment/Exercise - 10/01/17 0001      Ambulation/Gait   Gait Comments  cuing for gait to heel strike an dno walk on toe to facilitate ext      Knee/Hip Exercises: Aerobic   Recumbent Bike  6 min full rev    Nustep  L 5 7 min LE only      Knee/Hip Exercises: Machines for Strengthening   Cybex Knee Extension  10lb 3x10     Cybex Knee Flexion  20# 2 sets 15    Cybex Leg Press  30# 2 sets 15      Vasopneumatic   Number Minutes Vasopneumatic   15 minutes    Vasopnuematic Location   Knee    Vasopneumatic Pressure  Medium    Vasopneumatic Temperature   35      Manual Therapy   Manual Therapy  Soft tissue mobilization;Passive ROM;Joint mobilization    Joint Mobilization  flex and ext    Soft tissue mobilization  quad and lateral ITB    Passive ROM  flex and ext               PT Short Term Goals - 09/17/17 5189      PT SHORT TERM GOAL #1   Title  assure independent with HEP    Status  Achieved  PT Long Term Goals - 09/26/17 1015      PT LONG TERM GOAL #1   Title  increase AROM to 5-114 degrees flexion    Status  Partially Met      PT LONG TERM GOAL #2   Title  walk without deviation all community distances    Status  Partially Met      PT LONG TERM GOAL #3   Title  decrease pain 50%    Status  Partially Met      PT LONG TERM GOAL #4   Title  go up and down stairs step over step    Status  Partially Met            Plan - 10/01/17 1305    Clinical Impression Statement  focus on STW and stretching esp in ext as pt was very sore and stiff and pt was upset d/t GF having a stroke increase walking in hospital.    PT Treatment/Interventions  ADLs/Self Care Home Management;Cryotherapy;Electrical Stimulation;Gait training;Neuromuscular re-education;Balance training;Therapeutic exercise;Therapeutic activities;Functional mobility training;Stair training;Patient/family education;Manual techniques;Vasopneumatic Device    PT Next Visit Plan  gait/ROM/strengthen and func ASSESS goals       Patient will benefit from skilled therapeutic intervention in order to improve the following deficits and impairments:  Abnormal gait, Decreased activity tolerance, Decreased  balance, Decreased mobility, Decreased strength, Increased edema, Impaired flexibility, Pain, Cardiopulmonary status limiting activity, Decreased endurance, Decreased range of motion, Difficulty walking  Visit Diagnosis: Acute pain of left knee  Localized edema  Difficulty in walking, not elsewhere classified     Problem List Patient Active Problem List   Diagnosis Date Noted  . Primary osteoarthritis of right knee 06/07/2017  . Primary osteoarthritis of left knee 06/07/2017  . Effusion, left knee 06/07/2017    Marquette Piontek,ANGIE PTA 10/01/2017, 1:07 PM  [place] [address] [place], [place], [address] Phone: [phone]   Fax:  [phone]  Name: Peter Roth MRN: [id number] Date of Birth: 13-Dec-1960

## 2017-10-03 ENCOUNTER — Ambulatory Visit: Payer: Medicare Other | Admitting: Physical Therapy

## 2017-10-03 DIAGNOSIS — M25562 Pain in left knee: Secondary | ICD-10-CM

## 2017-10-03 DIAGNOSIS — M25662 Stiffness of left knee, not elsewhere classified: Secondary | ICD-10-CM

## 2017-10-03 DIAGNOSIS — R262 Difficulty in walking, not elsewhere classified: Secondary | ICD-10-CM

## 2017-10-03 DIAGNOSIS — R600 Localized edema: Secondary | ICD-10-CM

## 2017-10-03 NOTE — Therapy (Signed)
[place], [place], [address] Phone: [phone]   Fax:  [phone]  Physical Therapy Treatment  Patient Details  Name: Peter Roth MRN: [id number] Date of Birth: October 25, 1960 Referring Provider: Berenice Primas   Encounter Date: 10/03/2017  PT End of Session - 10/03/17 0920    Visit Number  8    Date for PT Re-Evaluation  11/10/17    PT Start Time  0845    PT Stop Time  0945    PT Time Calculation (min)  60 min       Past Medical History:  Diagnosis Date  . Arthritis   . Bipolar 1 disorder ([place])   . Hyperlipidemia   . Hypertension   . Stress headaches   . Type 2 diabetes mellitus ([place])    followed by pcp  . Wears glasses     Past Surgical History:  Procedure Laterality Date  . KNEE ARTHROSCOPY Right ?date  . TOTAL KNEE ARTHROPLASTY Right 06/07/2017   Procedure: RIGHT TOTAL KNEE ARTHROPLASTY, INJECTION LEFT KNEE WITH FLUID ASPIRATION;  Surgeon: Dorna Leitz, MD;  Location: [facility];  Service: Orthopedics;  Laterality: Right;  . TOTAL KNEE ARTHROPLASTY Left 08/23/2017   Procedure: LEFT TOTAL KNEE ARTHROPLASTY;  Surgeon: Dorna Leitz, MD;  Location[facility];  Service: Orthopedics;  Laterality: Left;    There were no vitals filed for this visit.  Subjective Assessment - 10/03/17 0850    Subjective  working on walking with my heel    Currently in Pain?  Yes    Pain Score  3     Pain Location  Knee    Pain Orientation  Left                No data recorded       [facility] Adult PT Treatment/Exercise - 10/03/17 0001      Knee/Hip Exercises: Aerobic   Recumbent Bike  6 min full rev    Nustep  L 5 7 min LE only      Knee/Hip Exercises: Machines for Strengthening   Cybex Knee Extension  10lb 3x10     Cybex Knee Flexion  20# 2 sets 15    Cybex Leg Press  30# 2 sets 15      Knee/Hip Exercises: Standing   Heel Raises  Both;20 reps    Terminal Knee Extension  Left;20 reps with  ball    Other Standing Knee Exercises  10 inch step tap 2 sets 10 working on increase knee flex      Vasopneumatic   Number Minutes Vasopneumatic   15 minutes    Vasopnuematic Location   Knee    Vasopneumatic Pressure  Medium    Vasopneumatic Temperature   35      Manual Therapy   Manual Therapy  Soft tissue mobilization;Passive ROM;Joint mobilization    Joint Mobilization  ext belt mobs    Soft tissue mobilization  quad and lateral ITB    Passive ROM  flex and ext               PT Short Term Goals - 09/17/17 0843      PT SHORT TERM GOAL #1   Title  assure independent with HEP    Status  Achieved        PT Long Term Goals - 10/03/17 0920      PT LONG TERM GOAL #1   Title  increase AROM to 5-114  degrees flexion    Status  Partially Met      PT LONG TERM GOAL #2   Title  walk without deviation all community distances    Status  Partially Met      PT LONG TERM GOAL #3   Title  decrease pain 50%    Baseline  some increased discomfort this week with increased walking with GF in hospital    Status  Partially Met      PT LONG TERM GOAL #4   Title  go up and down stairs step over step    Status  Partially Met            Plan - 10/03/17 0920    Clinical Impression Statement  improved ext with ex and increased heel strike with ext with gait. progressing with LTGs    PT Treatment/Interventions  ADLs/Self Care Home Management;Cryotherapy;Electrical Stimulation;Gait training;Neuromuscular re-education;Balance training;Therapeutic exercise;Therapeutic activities;Functional mobility training;Stair training;Patient/family education;Manual techniques;Vasopneumatic Device    PT Next Visit Plan  gait/ROM/strengthen and func       Patient will benefit from skilled therapeutic intervention in order to improve the following deficits and impairments:  Abnormal gait, Decreased activity tolerance, Decreased balance, Decreased mobility, Decreased strength, Increased edema,  Impaired flexibility, Pain, Cardiopulmonary status limiting activity, Decreased endurance, Decreased range of motion, Difficulty walking  Visit Diagnosis: Acute pain of left knee  Localized edema  Difficulty in walking, not elsewhere classified  Stiffness of left knee, not elsewhere classified     Problem List Patient Active Problem List   Diagnosis Date Noted  . Primary osteoarthritis of right knee 06/07/2017  . Primary osteoarthritis of left knee 06/07/2017  . Effusion, left knee 06/07/2017    Thanh Pomerleau,ANGIE PTA 10/03/2017, 9:22 AM  [address] [place], [place], [address] Phone: [phone]   Fax:  [phone]  Name: Peter Roth MRN: [id number] Date of Birth: 1961-07-04

## 2017-10-07 ENCOUNTER — Encounter: Payer: Self-pay | Admitting: Physical Therapy

## 2017-10-07 ENCOUNTER — Ambulatory Visit: Payer: Medicare Other | Attending: Orthopedic Surgery | Admitting: Physical Therapy

## 2017-10-07 DIAGNOSIS — R600 Localized edema: Secondary | ICD-10-CM | POA: Insufficient documentation

## 2017-10-07 DIAGNOSIS — M25562 Pain in left knee: Secondary | ICD-10-CM | POA: Diagnosis not present

## 2017-10-07 DIAGNOSIS — M25662 Stiffness of left knee, not elsewhere classified: Secondary | ICD-10-CM | POA: Diagnosis present

## 2017-10-07 DIAGNOSIS — R262 Difficulty in walking, not elsewhere classified: Secondary | ICD-10-CM

## 2017-10-07 NOTE — Therapy (Signed)
[place], [place], [address] Phone: [phone]   Fax:  [phone]  Physical Therapy Treatment  Patient Details  Name: Peter Roth MRN: [id number] Date of Birth: [birth year] Referring Provider: Berenice Primas   Encounter Date: 10/07/2017  PT End of Session - 10/07/17 0843    Visit Number  9    Date for PT Re-Evaluation  11/10/17    PT Start Time  0758    PT Stop Time  0900    PT Time Calculation (min)  62 min    Activity Tolerance  Patient tolerated treatment well    Behavior During Therapy  [facility] for tasks assessed/performed       Past Medical History:  Diagnosis Date  . Arthritis   . Bipolar 1 disorder ([place])   . Hyperlipidemia   . Hypertension   . Stress headaches   . Type 2 diabetes mellitus ([place])    followed by pcp  . Wears glasses     Past Surgical History:  Procedure Laterality Date  . KNEE ARTHROSCOPY Right ?date  . TOTAL KNEE ARTHROPLASTY Right 06/07/2017   Procedure: RIGHT TOTAL KNEE ARTHROPLASTY, INJECTION LEFT KNEE WITH FLUID ASPIRATION;  Surgeon: Dorna Leitz, MD;  [facility];  Service: Orthopedics;  Laterality: Right;  . TOTAL KNEE ARTHROPLASTY Left 08/23/2017   Procedure: LEFT TOTAL KNEE ARTHROPLASTY;  Surgeon: Dorna Leitz, MD;  [facility];  Service: Orthopedics;  Laterality: Left;    There were no vitals filed for this visit.  Subjective Assessment - 10/07/17 0805    Subjective  I have been at hospital with girlfriend, that is a lot of walking and my knee really hurts after that, have to go home and put ice on it.    Currently in Pain?  Yes    Pain Score  4     Pain Location  Knee    Pain Orientation  Left    Aggravating Factors   walking a lot more while girlfriend is in the hospital                No data recorded       [facility] Adult PT Treatment/Exercise - 10/07/17 0001      High Level Balance   High Level Balance Comments  standing  on Bosu reaching, head turns      Knee/Hip Exercises: Aerobic   Elliptical  2 fwd/2 back R=6, I=10    Recumbent Bike  6 min full rev    Nustep  L 5 5 min LE only      Knee/Hip Exercises: Machines for Strengthening   Cybex Knee Extension  10lb 3x10     Cybex Knee Flexion  20# 2 sets 15, 15# left leg only    Cybex Leg Press  30# 2 sets 15, 20# left leg only      Knee/Hip Exercises: Standing   Walking with Sports Cord  black tband all directions      Vasopneumatic   Number Minutes Vasopneumatic   15 minutes    Vasopnuematic Location   Knee    Vasopneumatic Pressure  Medium    Vasopneumatic Temperature   35      Manual Therapy   Manual Therapy  Passive ROM    Passive ROM  flex and ext               PT Short Term Goals - 09/17/17 7510      PT  SHORT TERM GOAL #1   Title  assure independent with HEP    Status  Achieved        PT Long Term Goals - 10/03/17 0920      PT LONG TERM GOAL #1   Title  increase AROM to 5-114 degrees flexion    Status  Partially Met      PT LONG TERM GOAL #2   Title  walk without deviation all community distances    Status  Partially Met      PT LONG TERM GOAL #3   Title  decrease pain 50%    Baseline  some increased discomfort this week with increased walking with GF in hospital    Status  Partially Met      PT LONG TERM GOAL #4   Title  go up and down stairs step over step    Status  Partially Met            Plan - 10/07/17 0843    Clinical Impression Statement  Patient gets short of breath with aerobic activities, had some difficulty on Bosu, he was very tight into extension, a lot of gaurding with passive extension    PT Next Visit Plan  gait/ROM/strengthen and func    Consulted and Agree with Plan of Care  Patient       Patient will benefit from skilled therapeutic intervention in order to improve the following deficits and impairments:  Abnormal gait, Decreased activity tolerance, Decreased balance, Decreased  mobility, Decreased strength, Increased edema, Impaired flexibility, Pain, Cardiopulmonary status limiting activity, Decreased endurance, Decreased range of motion, Difficulty walking  Visit Diagnosis: Acute pain of left knee  Localized edema  Difficulty in walking, not elsewhere classified  Stiffness of left knee, not elsewhere classified     Problem List Patient Active Problem List   Diagnosis Date Noted  . Primary osteoarthritis of right knee 06/07/2017  . Primary osteoarthritis of left knee 06/07/2017  . Effusion, left knee 06/07/2017    Sumner Boast., PT 10/07/2017, 8:45 AM  [address] [place], [place], [address] Phone: [phone]   Fax:  [phone]  Name: LEVELL TAVANO MRN: [id number] Date of Birth: 09-17-1960

## 2017-10-10 ENCOUNTER — Ambulatory Visit: Payer: Medicare Other | Admitting: Physical Therapy

## 2017-10-10 DIAGNOSIS — R600 Localized edema: Secondary | ICD-10-CM

## 2017-10-10 DIAGNOSIS — M25662 Stiffness of left knee, not elsewhere classified: Secondary | ICD-10-CM

## 2017-10-10 DIAGNOSIS — M25562 Pain in left knee: Secondary | ICD-10-CM | POA: Diagnosis not present

## 2017-10-10 DIAGNOSIS — R262 Difficulty in walking, not elsewhere classified: Secondary | ICD-10-CM

## 2017-10-10 NOTE — Therapy (Signed)
[place] [address] [place], [place], [address] Phone: [phone]   Fax:  [phone]  Physical Therapy Treatment This note covers the past 10 visits:  09/10/2017 - 10/10/2017 Patient Details  Name: Peter Roth MRN: [id number] Date of Birth: 12-13-1960 Referring Provider: Berenice Primas   Encounter Date: 10/10/2017  PT End of Session - 10/10/17 0935    Visit Number  10    Date for PT Re-Evaluation  11/10/17    PT Start Time  0845    PT Stop Time  0950    PT Time Calculation (min)  65 min       Past Medical History:  Diagnosis Date  . Arthritis   . Bipolar 1 disorder ([place])   . Hyperlipidemia   . Hypertension   . Stress headaches   . Type 2 diabetes mellitus ([place])    followed by pcp  . Wears glasses     Past Surgical History:  Procedure Laterality Date  . KNEE ARTHROSCOPY Right ?date  . TOTAL KNEE ARTHROPLASTY Right 06/07/2017   Procedure: RIGHT TOTAL KNEE ARTHROPLASTY, INJECTION LEFT KNEE WITH FLUID ASPIRATION;  Surgeon: Dorna Leitz, MD;  [facility];  Service: Orthopedics;  Laterality: Right;  . TOTAL KNEE ARTHROPLASTY Left 08/23/2017   Procedure: LEFT TOTAL KNEE ARTHROPLASTY;  Surgeon: Dorna Leitz, MD;  [facility];  Service: Orthopedics;  Laterality: Left;    There were no vitals filed for this visit.  Subjective Assessment - 10/10/17 0853    Subjective  worn out from all the walking at hospital. mostly stiff and hurts at night    Currently in Pain?  Yes    Pain Score  5     Pain Location  Knee    Pain Orientation  Left         [facility] PT Assessment - 10/10/17 0001      AROM   Left Knee Extension  4    Left Knee Flexion  113      Strength   Overall Strength Comments  4/5                   [facility] Adult PT Treatment/Exercise - 10/10/17 0001      Knee/Hip Exercises: Aerobic   Recumbent Bike  6 min full rev    Nustep  L 5 6 min LE only      Knee/Hip Exercises:  Machines for Strengthening   Cybex Knee Extension  10lb 3x10     Cybex Knee Flexion  20# 2 sets 15, 15# left leg only    Cybex Leg Press  40# 2 sets 15      Knee/Hip Exercises: Standing   Walking with Sports Cord  50# 5 times fwd and back,3 times each side    Other Standing Knee Exercises  5# dead lift 2 sets 10 working on [employer]   Number Minutes Vasopneumatic   15 minutes    Vasopnuematic Location   Knee    Vasopneumatic Pressure  Medium    Vasopneumatic Temperature   35      Manual Therapy   Manual Therapy  Soft tissue mobilization;Passive ROM    Soft tissue mobilization  quad and lateral ITB    Passive ROM  flex and ext               PT Short Term Goals - 09/17/17 0843      PT SHORT  TERM GOAL #1   Title  assure independent with HEP    Status  Achieved        PT Long Term Goals - 10/10/17 0935      PT LONG TERM GOAL #1   Title  increase AROM to 5-114 degrees flexion    Status  Partially Met      PT LONG TERM GOAL #2   Title  walk without deviation all community distances    Status  Partially Met      PT LONG TERM GOAL #3   Title  decrease pain 50%    Status  Achieved      PT LONG TERM GOAL #4   Title  go up and down stairs step over step    Status  Partially Met            Plan - 10/10/17 0936    Clinical Impression Statement  progressing with LTGS. increased soreness and stiffness with increase actvity walking hospiatl halls. flexion doing well but still decreased ext    PT Treatment/Interventions  ADLs/Self Care Home Management;Cryotherapy;Electrical Stimulation;Gait training;Neuromuscular re-education;Balance training;Therapeutic exercise;Therapeutic activities;Functional mobility training;Stair training;Patient/family education;Manual techniques;Vasopneumatic Device    PT Next Visit Plan  gait/ROM/strengthen and func       Patient will benefit from skilled therapeutic intervention in order to improve the following deficits  and impairments:  Abnormal gait, Decreased activity tolerance, Decreased balance, Decreased mobility, Decreased strength, Increased edema, Impaired flexibility, Pain, Cardiopulmonary status limiting activity, Decreased endurance, Decreased range of motion, Difficulty walking  Visit Diagnosis: Acute pain of left knee  Localized edema  Difficulty in walking, not elsewhere classified  Stiffness of left knee, not elsewhere classified     Problem List Patient Active Problem List   Diagnosis Date Noted  . Primary osteoarthritis of right knee 06/07/2017  . Primary osteoarthritis of left knee 06/07/2017  . Effusion, left knee 06/07/2017    Kierrah Kilbride,ANGIE PTA 10/10/2017, 9:37 AM  [address] [place], [place], [address] Phone: [phone]   Fax:  [phone]  Name: Peter Roth MRN: [id number] Date of Birth: 1961-04-23

## 2017-10-15 ENCOUNTER — Ambulatory Visit: Payer: Medicare Other | Admitting: Physical Therapy

## 2017-10-15 DIAGNOSIS — M25562 Pain in left knee: Secondary | ICD-10-CM

## 2017-10-15 DIAGNOSIS — R600 Localized edema: Secondary | ICD-10-CM

## 2017-10-15 DIAGNOSIS — M25662 Stiffness of left knee, not elsewhere classified: Secondary | ICD-10-CM

## 2017-10-15 DIAGNOSIS — R262 Difficulty in walking, not elsewhere classified: Secondary | ICD-10-CM

## 2017-10-15 NOTE — Therapy (Signed)
[place], [place], [address] Phone: [phone]   Fax:  [phone]  Physical Therapy Treatment  Patient Details  Name: Peter Roth MRN: [id number] Date of Birth: 1960-11-21 Referring Provider: Berenice Primas   Encounter Date: 10/15/2017  PT End of Session - 10/15/17 0924    Visit Number  11    Date for PT Re-Evaluation  11/10/17    PT Start Time  0845    PT Stop Time  0945    PT Time Calculation (min)  60 min       Past Medical History:  Diagnosis Date  . Arthritis   . Bipolar 1 disorder ([place])   . Hyperlipidemia   . Hypertension   . Stress headaches   . Type 2 diabetes mellitus ([place])    followed by pcp  . Wears glasses     Past Surgical History:  Procedure Laterality Date  . KNEE ARTHROSCOPY Right ?date  . TOTAL KNEE ARTHROPLASTY Right 06/07/2017   Procedure: RIGHT TOTAL KNEE ARTHROPLASTY, INJECTION LEFT KNEE WITH FLUID ASPIRATION;  Surgeon: Dorna Leitz, MD;  [facility];  Service: Orthopedics;  Laterality: Right;  . TOTAL KNEE ARTHROPLASTY Left 08/23/2017   Procedure: LEFT TOTAL KNEE ARTHROPLASTY;  Surgeon: Dorna Leitz, MD;  [facility];  Service: Orthopedics;  Laterality: Left;    There were no vitals filed for this visit.  Subjective Assessment - 10/15/17 0857    Subjective  GF coming home tomorrow and needs 24 hour care    Currently in Pain?  No/denies                       [facility] Adult PT Treatment/Exercise - 10/15/17 0001      Knee/Hip Exercises: Aerobic   Elliptical  3 fwd/3 back R=6, I=10    Nustep  L 5 6 min LE only      Knee/Hip Exercises: Machines for Strengthening   Cybex Knee Extension  10lb 3x10     Cybex Knee Flexion  20# 2 sets 15, 15# left leg only    Cybex Leg Press  40# 2 sets 15      Knee/Hip Exercises: Standing   Heel Raises  Both;20 reps    Other Standing Knee Exercises  3# marching fwd/back- focus on flexion and heel  strike  3# stepping fwd and back over foam roll      Vasopneumatic   Number Minutes Vasopneumatic   15 minutes    Vasopnuematic Location   Knee    Vasopneumatic Pressure  Medium    Vasopneumatic Temperature   35      Manual Therapy   Manual Therapy  Soft tissue mobilization;Passive ROM    Soft tissue mobilization  quad and lateral ITB    Passive ROM  flex and ext               PT Short Term Goals - 09/17/17 0843      PT SHORT TERM GOAL #1   Title  assure independent with HEP    Status  Achieved        PT Long Term Goals - 10/10/17 0935      PT LONG TERM GOAL #1   Title  increase AROM to 5-114 degrees flexion    Status  Partially Met      PT LONG TERM GOAL #2   Title  walk without deviation all community distances  Status  Partially Met      PT LONG TERM GOAL #3   Title  decrease pain 50%    Status  Achieved      PT LONG TERM GOAL #4   Title  go up and down stairs step over step    Status  Partially Met            Plan - 10/15/17 0925    Clinical Impression Statement  cuing with func ex with 3 # wt focus on correct tech of increased knee flexion,heel strike an dno circumduction- focus on isolating and controling mvmt    PT Treatment/Interventions  ADLs/Self Care Home Management;Cryotherapy;Electrical Stimulation;Gait training;Neuromuscular re-education;Balance training;Therapeutic exercise;Therapeutic activities;Functional mobility training;Stair training;Patient/family education;Manual techniques;Vasopneumatic Device    PT Next Visit Plan  gait/ROM/strengthen and func       Patient will benefit from skilled therapeutic intervention in order to improve the following deficits and impairments:  Abnormal gait, Decreased activity tolerance, Decreased balance, Decreased mobility, Decreased strength, Increased edema, Impaired flexibility, Pain, Cardiopulmonary status limiting activity, Decreased endurance, Decreased range of motion, Difficulty  walking  Visit Diagnosis: Acute pain of left knee  Localized edema  Difficulty in walking, not elsewhere classified  Stiffness of left knee, not elsewhere classified     Problem List Patient Active Problem List   Diagnosis Date Noted  . Primary osteoarthritis of right knee 06/07/2017  . Primary osteoarthritis of left knee 06/07/2017  . Effusion, left knee 06/07/2017    Caila Cirelli,ANGIE PTA 10/15/2017, 9:26 AM  [address] [place], [place], [address] Phone: [phone]   Fax:  [phone]  Name: Peter Roth MRN: [id number] Date of Birth: 11/13/1960

## 2017-10-17 ENCOUNTER — Ambulatory Visit: Payer: Medicare Other | Admitting: Physical Therapy

## 2017-10-17 DIAGNOSIS — M25662 Stiffness of left knee, not elsewhere classified: Secondary | ICD-10-CM

## 2017-10-17 DIAGNOSIS — R600 Localized edema: Secondary | ICD-10-CM

## 2017-10-17 DIAGNOSIS — R262 Difficulty in walking, not elsewhere classified: Secondary | ICD-10-CM

## 2017-10-17 DIAGNOSIS — M25562 Pain in left knee: Secondary | ICD-10-CM | POA: Diagnosis not present

## 2017-10-17 NOTE — Therapy (Signed)
[address] [place], [place], [address] Phone: [phone]   Fax:  [phone]  Physical Therapy Treatment  Patient Details  Name: Peter Roth MRN: [id number] Date of Birth: 06-02-1961 Referring Provider: Berenice Primas   Encounter Date: 10/17/2017  PT End of Session - 10/17/17 0924    Visit Number  12    Date for PT Re-Evaluation  11/10/17    PT Start Time  0856    PT Stop Time  0950    PT Time Calculation (min)  54 min       Past Medical History:  Diagnosis Date  . Arthritis   . Bipolar 1 disorder ([place])   . Hyperlipidemia   . Hypertension   . Stress headaches   . Type 2 diabetes mellitus ([place])    followed by pcp  . Wears glasses     Past Surgical History:  Procedure Laterality Date  . KNEE ARTHROSCOPY Right ?date  . TOTAL KNEE ARTHROPLASTY Right 06/07/2017   Procedure: RIGHT TOTAL KNEE ARTHROPLASTY, INJECTION LEFT KNEE WITH FLUID ASPIRATION;  Surgeon: Dorna Leitz, MD;  [facility];  Service: Orthopedics;  Laterality: Right;  . TOTAL KNEE ARTHROPLASTY Left 08/23/2017   Procedure: LEFT TOTAL KNEE ARTHROPLASTY;  Surgeon: Dorna Leitz, MD;  [facility];  Service: Orthopedics;  Laterality: Left;    There were no vitals filed for this visit.  Subjective Assessment - 10/17/17 0857    Subjective  MD very pleased with knee    Currently in Pain?  Yes    Pain Score  3     Pain Location  Knee    Pain Orientation  Left                       [facility] Adult PT Treatment/Exercise - 10/17/17 0001      Knee/Hip Exercises: Aerobic   Elliptical  3 fwd/3 back R=6, I=10    Recumbent Bike  6 min full rev    Nustep  L 5 6 min LE only      Knee/Hip Exercises: Machines for Strengthening   Cybex Knee Extension  10lb 3x10     Cybex Knee Flexion  20# 2 sets 15, 15# left leg only    Cybex Leg Press  40# 2 sets 15      Vasopneumatic   Number Minutes Vasopneumatic   15 minutes    Vasopnuematic Location   Knee    Vasopneumatic Pressure  Medium    Vasopneumatic Temperature   35      Manual Therapy   Manual Therapy  Soft tissue mobilization;Passive ROM    Soft tissue mobilization  quad and lateral ITB    Passive ROM  flex and ext               PT Short Term Goals - 09/17/17 0843      PT SHORT TERM GOAL #1   Title  assure independent with HEP    Status  Achieved        PT Long Term Goals - 10/10/17 0935      PT LONG TERM GOAL #1   Title  increase AROM to 5-114 degrees flexion    Status  Partially Met      PT LONG TERM GOAL #2   Title  walk without deviation all community distances    Status  Partially Met      PT LONG TERM GOAL #3  Title  decrease pain 50%    Status  Achieved      PT LONG TERM GOAL #4   Title  go up and down stairs step over step    Status  Partially Met            Plan - 10/17/17 0924    Clinical Impression Statement  pt continues to show improved ROM an dstrength, still tightness and gait compensations esp with fatigue requiring fatigue. progressing with goals    PT Treatment/Interventions  ADLs/Self Care Home Management;Cryotherapy;Electrical Stimulation;Gait training;Neuromuscular re-education;Balance training;Therapeutic exercise;Therapeutic activities;Functional mobility training;Stair training;Patient/family education;Manual techniques;Vasopneumatic Device    PT Next Visit Plan  gait/ROM/strengthen and func       Patient will benefit from skilled therapeutic intervention in order to improve the following deficits and impairments:  Abnormal gait, Decreased activity tolerance, Decreased balance, Decreased mobility, Decreased strength, Increased edema, Impaired flexibility, Pain, Cardiopulmonary status limiting activity, Decreased endurance, Decreased range of motion, Difficulty walking  Visit Diagnosis: Acute pain of left knee  Localized edema  Difficulty in walking, not elsewhere classified  Stiffness of  left knee, not elsewhere classified     Problem List Patient Active Problem List   Diagnosis Date Noted  . Primary osteoarthritis of right knee 06/07/2017  . Primary osteoarthritis of left knee 06/07/2017  . Effusion, left knee 06/07/2017    Zayan Delvecchio,ANGIE PTA 10/17/2017, 9:27 AM  [address] [place], [place], [address] Phone: [phone]   Fax:  [phone]  Name: YVETTE ROARK MRN: [id number] Date of Birth: 1960/10/27

## 2017-10-22 ENCOUNTER — Ambulatory Visit: Payer: Medicare Other | Admitting: Physical Therapy

## 2017-10-22 DIAGNOSIS — R262 Difficulty in walking, not elsewhere classified: Secondary | ICD-10-CM

## 2017-10-22 DIAGNOSIS — M25562 Pain in left knee: Secondary | ICD-10-CM

## 2017-10-22 DIAGNOSIS — R600 Localized edema: Secondary | ICD-10-CM

## 2017-10-22 DIAGNOSIS — M25662 Stiffness of left knee, not elsewhere classified: Secondary | ICD-10-CM

## 2017-10-22 NOTE — Therapy (Signed)
[address] [place], [place], [address] Phone: [phone]   Fax:  [phone]  Physical Therapy Treatment  Patient Details  Name: AMADO ANDAL MRN: [id number] Date of Birth: Jul 30, 1960 Referring Provider: Berenice Primas   Encounter Date: 10/22/2017  PT End of Session - 10/22/17 0935    Visit Number  13    Date for PT Re-Evaluation  11/10/17    PT Start Time  0850    PT Stop Time  0950    PT Time Calculation (min)  60 min       Past Medical History:  Diagnosis Date  . Arthritis   . Bipolar 1 disorder ([place])   . Hyperlipidemia   . Hypertension   . Stress headaches   . Type 2 diabetes mellitus ([place])    followed by pcp  . Wears glasses     Past Surgical History:  Procedure Laterality Date  . KNEE ARTHROSCOPY Right ?date  . TOTAL KNEE ARTHROPLASTY Right 06/07/2017   Procedure: RIGHT TOTAL KNEE ARTHROPLASTY, INJECTION LEFT KNEE WITH FLUID ASPIRATION;  Surgeon: Dorna Leitz, MD;  [facility];  Service: Orthopedics;  Laterality: Right;  . TOTAL KNEE ARTHROPLASTY Left 08/23/2017   Procedure: LEFT TOTAL KNEE ARTHROPLASTY;  Surgeon: Dorna Leitz, MD;  [facility];  Service: Orthopedics;  Laterality: Left;    There were no vitals filed for this visit.  Subjective Assessment - 10/22/17 0851    Subjective  stiff like always    Currently in Pain?  Yes    Pain Score  3     Pain Location  Knee    Pain Orientation  Left    Pain Descriptors / Indicators  Tightness         [facility] PT Assessment - 10/22/17 0001      AROM   Left Knee Extension  3    Left Knee Flexion  120                   [facility] Adult PT Treatment/Exercise - 10/22/17 0001      High Level Balance   High Level Balance Activities  Side stepping;Marching forwards;Marching backwards 3# balance/strength and func ROM    High Level Balance Comments  3# stepping over and back with foam roll front/back and SW ( CGA for  balance)      Knee/Hip Exercises: Aerobic   Elliptical  3 fwd/3 back R=6, I=10      Knee/Hip Exercises: Machines for Strengthening   Cybex Leg Press  50# 2 sets 15 cuing for full TKE      Knee/Hip Exercises: Standing   Lateral Step Up  Left;15 reps;Hand Hold: 1;Step Height: 6" cuing for full TKE    Functional Squat  20 reps cuing for equal wt distribution    Other Standing Knee Exercises  monster walk very fatiguing for pt,cuing needed to increase step length      Vasopneumatic   Number Minutes Vasopneumatic   15 minutes    Vasopnuematic Location   Knee    Vasopneumatic Pressure  Medium    Vasopneumatic Temperature   35      Manual Therapy   Manual Therapy  Passive ROM    Passive ROM  flex and ext end range stretch               PT Short Term Goals - 09/17/17 0843      PT SHORT TERM GOAL #1  Title  assure independent with HEP    Status  Achieved        PT Long Term Goals - 10/22/17 0935      PT LONG TERM GOAL #1   Title  increase AROM to 5-114 degrees flexion    Status  Achieved      PT LONG TERM GOAL #2   Title  walk without deviation all community distances    Status  Achieved      PT LONG TERM GOAL #3   Title  decrease pain 50%    Status  Achieved      PT LONG TERM GOAL #4   Title  go up and down stairs step over step    Status  Partially Met            Plan - 10/22/17 0935    Clinical Impression Statement  LTGS met except steps - will focus on this next session. Focus on TKE and SLS activities today and needed verb cuing and CGA    PT Treatment/Interventions  ADLs/Self Care Home Management;Cryotherapy;Electrical Stimulation;Gait training;Neuromuscular re-education;Balance training;Therapeutic exercise;Therapeutic activities;Functional mobility training;Stair training;Patient/family education;Manual techniques;Vasopneumatic Device    PT Next Visit Plan  STAIRS       Patient will benefit from skilled therapeutic intervention in order to  improve the following deficits and impairments:  Abnormal gait, Decreased activity tolerance, Decreased balance, Decreased mobility, Decreased strength, Increased edema, Impaired flexibility, Pain, Cardiopulmonary status limiting activity, Decreased endurance, Decreased range of motion, Difficulty walking  Visit Diagnosis: Acute pain of left knee  Localized edema  Difficulty in walking, not elsewhere classified  Stiffness of left knee, not elsewhere classified     Problem List Patient Active Problem List   Diagnosis Date Noted  . Primary osteoarthritis of right knee 06/07/2017  . Primary osteoarthritis of left knee 06/07/2017  . Effusion, left knee 06/07/2017    Hansen Carino,ANGIE PTA 10/22/2017, 9:37 AM  [address] [place], [place], [address] Phone: [phone]   Fax:  [phone]  Name: BRENAN MODESTO MRN: [id number] Date of Birth: May 31, 1961

## 2017-10-24 ENCOUNTER — Encounter: Payer: Self-pay | Admitting: Physical Therapy

## 2017-10-24 ENCOUNTER — Ambulatory Visit: Payer: Medicare Other | Admitting: Physical Therapy

## 2017-10-24 DIAGNOSIS — R262 Difficulty in walking, not elsewhere classified: Secondary | ICD-10-CM

## 2017-10-24 DIAGNOSIS — M25562 Pain in left knee: Secondary | ICD-10-CM

## 2017-10-24 DIAGNOSIS — R600 Localized edema: Secondary | ICD-10-CM

## 2017-10-24 DIAGNOSIS — M25662 Stiffness of left knee, not elsewhere classified: Secondary | ICD-10-CM

## 2017-10-24 NOTE — Therapy (Signed)
[address] [place], [place], [address] Phone: [phone]   Fax:  [phone]  Physical Therapy Evaluation  Patient Details  Name: Peter Roth MRN: [id number] Date of Birth: [birth year] Referring Provider: Berenice Primas   Encounter Date: 10/24/2017  PT End of Session - 10/24/17 0944    Visit Number  14    Date for PT Re-Evaluation  11/10/17    PT Start Time  0855    PT Stop Time  0945    PT Time Calculation (min)  50 min    Activity Tolerance  Patient tolerated treatment well    Behavior During Therapy  [facility] for tasks assessed/performed       Past Medical History:  Diagnosis Date  . Arthritis   . Bipolar 1 disorder ([place])   . Hyperlipidemia   . Hypertension   . Stress headaches   . Type 2 diabetes mellitus ([place])    followed by pcp  . Wears glasses     Past Surgical History:  Procedure Laterality Date  . KNEE ARTHROSCOPY Right ?date  . TOTAL KNEE ARTHROPLASTY Right 06/07/2017   Procedure: RIGHT TOTAL KNEE ARTHROPLASTY, INJECTION LEFT KNEE WITH FLUID ASPIRATION;  Surgeon: Dorna Leitz, MD;  [facility];  Service: Orthopedics;  Laterality: Right;  . TOTAL KNEE ARTHROPLASTY Left 08/23/2017   Procedure: LEFT TOTAL KNEE ARTHROPLASTY;  Surgeon: Dorna Leitz, MD;  [facility];  Service: Orthopedics;  Laterality: Left;    There were no vitals filed for this visit.   Subjective Assessment - 10/24/17 0856    Subjective  "My knee is sore"    Currently in Pain?  No/denies    Pain Orientation  Left                    Objective measurements completed on examination: See above findings.      [facility] Adult PT Treatment/Exercise - 10/24/17 0001      Transfers   Transfers  Floor to Transfer    Comments  attempted to kneel on floor on airex (pt desires to return to gardening). Pt unable to do with UE support due to ROM and strength limitations      Knee/Hip Exercises: Aerobic   Elliptical  50fd/3back R=6 I=10      Knee/Hip Exercises: Machines for Strengthening   Cybex Leg Press  40# 2x15 decreased lb due to pt c/o muscle fatigue      Knee/Hip Exercises: Standing   Stairs  2x; Step over step; pt had right leg circumduction during ascending the stairs.    Walking with Sports Cord  Black TB in hallway     Other Standing Knee Exercises  step over foam roller with 3lb cuff weights 1x5      Vasopneumatic   Number Minutes Vasopneumatic   15 minutes    Vasopnuematic Location   Knee    Vasopneumatic Pressure  Medium    Vasopneumatic Temperature   33               PT Short Term Goals - 09/17/17 0843      PT SHORT TERM GOAL #1   Title  assure independent with HEP    Status  Achieved        PT Long Term Goals - 10/22/17 0935      PT LONG TERM GOAL #1   Title  increase AROM to 5-114 degrees flexion    Status  Achieved  PT LONG TERM GOAL #2   Title  walk without deviation all community distances    Status  Achieved      PT LONG TERM GOAL #3   Title  decrease pain 50%    Status  Achieved      PT LONG TERM GOAL #4   Title  go up and down stairs step over step    Status  Partially Met             Plan - 10/24/17 0949    Clinical Impression Statement  Pt able is to go up the stairs step over step but compensates on RLE with hip circumduction. Pt desires to return to gardening but unable to kneel on airex pad without upper extremity support due to lack of ROM and strength in L knee. Pt needed cuing to terminally extend knee during leg press.  Pt verbalized desire to be able to sit on counter stool and unable due to limited ROM.     Rehab Potential  Good    PT Frequency  3x / week    PT Duration  8 weeks    PT Treatment/Interventions  ADLs/Self Care Home Management;Cryotherapy;Electrical Stimulation;Gait training;Neuromuscular re-education;Balance training;Therapeutic exercise;Therapeutic activities;Functional mobility training;Stair  training;Patient/family education;Manual techniques;Vasopneumatic Device    PT Next Visit Plan  to work on transfers from surfaces of different heights       Patient will benefit from skilled therapeutic intervention in order to improve the following deficits and impairments:  Abnormal gait, Decreased activity tolerance, Decreased balance, Decreased mobility, Decreased strength, Increased edema, Impaired flexibility, Pain, Cardiopulmonary status limiting activity, Decreased endurance, Decreased range of motion, Difficulty walking  Visit Diagnosis: Acute pain of left knee  Localized edema  Difficulty in walking, not elsewhere classified  Stiffness of left knee, not elsewhere classified     Problem List Patient Active Problem List   Diagnosis Date Noted  . Primary osteoarthritis of right knee 06/07/2017  . Primary osteoarthritis of left knee 06/07/2017  . Effusion, left knee 06/07/2017    Loyal Gambler 10/24/2017, 9:58 AM  [address] [place], [place], [address] Phone: [phone]   Fax:  [phone]  Name: Peter Roth MRN: [id number] Date of Birth: 1961/03/10

## 2017-10-29 ENCOUNTER — Ambulatory Visit: Payer: Medicare Other | Admitting: Physical Therapy

## 2017-10-29 ENCOUNTER — Encounter: Payer: Self-pay | Admitting: Physical Therapy

## 2017-10-29 DIAGNOSIS — M25562 Pain in left knee: Secondary | ICD-10-CM

## 2017-10-29 DIAGNOSIS — R600 Localized edema: Secondary | ICD-10-CM

## 2017-10-29 DIAGNOSIS — M25662 Stiffness of left knee, not elsewhere classified: Secondary | ICD-10-CM

## 2017-10-29 DIAGNOSIS — R262 Difficulty in walking, not elsewhere classified: Secondary | ICD-10-CM

## 2017-10-29 NOTE — Therapy (Signed)
[address] [place], [place], [address] Phone: [phone]   Fax:  [phone]  Physical Therapy Treatment  Patient Details  Name: Peter Roth MRN: [id number] Date of Birth: 1961/05/02 Referring Provider: Berenice Primas   Encounter Date: 10/29/2017  PT End of Session - 10/29/17 0843    Visit Number  15    Date for PT Re-Evaluation  11/10/17    PT Start Time  0800    PT Stop Time  0900    PT Time Calculation (min)  60 min       Past Medical History:  Diagnosis Date  . Arthritis   . Bipolar 1 disorder ([place])   . Hyperlipidemia   . Hypertension   . Stress headaches   . Type 2 diabetes mellitus ([place])    followed by pcp  . Wears glasses     Past Surgical History:  Procedure Laterality Date  . KNEE ARTHROSCOPY Right ?date  . TOTAL KNEE ARTHROPLASTY Right 06/07/2017   Procedure: RIGHT TOTAL KNEE ARTHROPLASTY, INJECTION LEFT KNEE WITH FLUID ASPIRATION;  Surgeon: Dorna Leitz, MD;  [facility];  Service: Orthopedics;  Laterality: Right;  . TOTAL KNEE ARTHROPLASTY Left 08/23/2017   Procedure: LEFT TOTAL KNEE ARTHROPLASTY;  Surgeon: Dorna Leitz, MD;  [facility];  Service: Orthopedics;  Laterality: Left;    There were no vitals filed for this visit.  Subjective Assessment - 10/29/17 0802    Subjective  staying sore but I am very active    Currently in Pain?  Yes    Pain Score  2     Pain Location  Knee    Pain Orientation  Left         [facility] PT Assessment - 10/29/17 0001      AROM   Left Knee Extension  3    Left Knee Flexion  120                   [facility] Adult PT Treatment/Exercise - 10/29/17 0001      Ambulation/Gait   Stairs  Yes    Stairs Assistance  6: Modified independent (Device/Increase time)    Stair Management Technique  No rails    Number of Stairs  20    Gait Comments  step over step      Knee/Hip Exercises: Aerobic   Elliptical  38fd/3back R=6 I=10     Recumbent Bike  6 min full rev      Knee/Hip Exercises: Machines for Strengthening   Cybex Knee Extension  10lb 3x10     Cybex Knee Flexion  20# 2 sets 15, 15# left leg only    Cybex Leg Press  40# 2x15      Knee/Hip Exercises: Standing   Other Standing Knee Exercises  dead lift on airex 2 sets 10 5#      Knee/Hip Exercises: Seated   Sit to Sand  15 reps;without UE support resistance      Vasopneumatic   Number Minutes Vasopneumatic   15 minutes    Vasopnuematic Location   Knee    Vasopneumatic Pressure  Medium    Vasopneumatic Temperature   33      Manual Therapy   Manual Therapy  Passive ROM;Soft tissue mobilization    Soft tissue mobilization  knee and quad    Passive ROM  ext               PT Short Term  Goals - 09/17/17 0843      PT SHORT TERM GOAL #1   Title  assure independent with HEP    Status  Achieved        PT Long Term Goals - 10/29/17 0844      PT LONG TERM GOAL #1   Title  increase AROM to 5-114 degrees flexion    Status  Achieved      PT LONG TERM GOAL #2   Title  walk without deviation all community distances    Status  Achieved      PT LONG TERM GOAL #3   Title  decrease pain 50%    Status  Achieved      PT LONG TERM GOAL #4   Title  go up and down stairs step over step    Status  Achieved            Plan - 10/29/17 0847    Clinical Impression Statement  all goals met. pt c/o soreness but is very active at home. pt fatigues easily with func activities    PT Treatment/Interventions  ADLs/Self Care Home Management;Cryotherapy;Electrical Stimulation;Gait training;Neuromuscular re-education;Balance training;Therapeutic exercise;Therapeutic activities;Functional mobility training;Stair training;Patient/family education;Manual techniques;Vasopneumatic Device    PT Next Visit Plan  to work on transfers from surfaces of different heights- prepare to D/C       Patient will benefit from skilled therapeutic intervention in order to  improve the following deficits and impairments:  Abnormal gait, Decreased activity tolerance, Decreased balance, Decreased mobility, Decreased strength, Increased edema, Impaired flexibility, Pain, Cardiopulmonary status limiting activity, Decreased endurance, Decreased range of motion, Difficulty walking  Visit Diagnosis: Acute pain of left knee  Localized edema  Difficulty in walking, not elsewhere classified  Stiffness of left knee, not elsewhere classified     Problem List Patient Active Problem List   Diagnosis Date Noted  . Primary osteoarthritis of right knee 06/07/2017  . Primary osteoarthritis of left knee 06/07/2017  . Effusion, left knee 06/07/2017    Kawanda Drumheller,ANGIE PTA 10/29/2017, 8:53 AM  [address] [place], [place], [address] Phone: [phone]   Fax:  [phone]  Name: Peter Roth MRN: [id number] Date of Birth: [birth year]

## 2017-10-31 ENCOUNTER — Ambulatory Visit: Payer: Medicare Other | Admitting: Physical Therapy

## 2017-10-31 DIAGNOSIS — R262 Difficulty in walking, not elsewhere classified: Secondary | ICD-10-CM

## 2017-10-31 DIAGNOSIS — R600 Localized edema: Secondary | ICD-10-CM

## 2017-10-31 DIAGNOSIS — M25562 Pain in left knee: Secondary | ICD-10-CM

## 2017-10-31 DIAGNOSIS — M25662 Stiffness of left knee, not elsewhere classified: Secondary | ICD-10-CM

## 2017-10-31 NOTE — Therapy (Signed)
[facility] [place] [address] [place], [place], [address] Phone: [phone]   Fax:  [phone]  Physical Therapy Treatment  Patient Details  Name: BRAEDYN KAUK MRN: [id number] Date of Birth: [birth year] Referring Provider: Luiz Blare   Encounter Date: 10/31/2017  PT End of Session - 10/31/17 0854    Visit Number  16    Date for PT Re-Evaluation  11/10/17    PT Start Time  0815    PT Stop Time  0915    PT Time Calculation (min)  60 min       Past Medical History:  Diagnosis Date  . Arthritis   . Bipolar 1 disorder ([facility])   . Hyperlipidemia   . Hypertension   . Stress headaches   . Type 2 diabetes mellitus ([facility])    followed by pcp  . Wears glasses     Past Surgical History:  Procedure Laterality Date  . KNEE ARTHROSCOPY Right ?date  . TOTAL KNEE ARTHROPLASTY Right 06/07/2017   Procedure: RIGHT TOTAL KNEE ARTHROPLASTY, INJECTION LEFT KNEE WITH FLUID ASPIRATION;  Surgeon: Jodi Geralds, MD;  [facility];  Service: Orthopedics;  Laterality: Right;  . TOTAL KNEE ARTHROPLASTY Left 08/23/2017   Procedure: LEFT TOTAL KNEE ARTHROPLASTY;  Surgeon: Jodi Geralds, MD;  [facility];  Service: Orthopedics;  Laterality: Left;    There were no vitals filed for this visit.  Subjective Assessment - 10/31/17 0814    Subjective  just sore and stiff    Currently in Pain?  Yes    Pain Score  2     Pain Orientation  Left                       [facility] Adult PT Treatment/Exercise - 10/31/17 0001      Knee/Hip Exercises: Aerobic   Elliptical  53fwd/3back R=6 I=10    Recumbent Bike  6 min full rev      Knee/Hip Exercises: Machines for Strengthening   Cybex Knee Extension  10lb 3x10  focus on full TKE    Cybex Knee Flexion  20# 2 sets 15, 15# left leg only    Cybex Leg Press  50# 2x15 cued for full TKE      Knee/Hip Exercises: Seated   Sit to Sand  without UE support;3 sets;10 reps various ht object  lowest he could get was 16 inch       Vasopneumatic   Number Minutes Vasopneumatic   15 minutes    Vasopnuematic Location   Knee    Vasopneumatic Pressure  Medium    Vasopneumatic Temperature   33      Manual Therapy   Manual Therapy  Passive ROM;Soft tissue mobilization    Soft tissue mobilization  knee and quad    Passive ROM  ext               PT Short Term Goals - 09/17/17 0843      PT SHORT TERM GOAL #1   Title  assure independent with HEP    Status  Achieved        PT Long Term Goals - 10/29/17 0844      PT LONG TERM GOAL #1   Title  increase AROM to 5-114 degrees flexion    Status  Achieved      PT LONG TERM GOAL #2   Title  walk without deviation all community distances    Status  Achieved      PT LONG TERM GOAL #3   Title  decrease pain 50%    Status  Achieved      PT LONG TERM GOAL #4   Title  go up and down stairs step over step    Status  Achieved            Plan - 10/31/17 0855    Clinical Impression Statement  pt still c/o soreness and explained ot pt he is overdoing ( mowing and all household chores as GF had stroke and unable) pt wants to garden but unabel to get up and down from ground so worked on low seating so he could weed with bench- lowest was 16 inch with difficulty. Cuing with ex for TKE    PT Treatment/Interventions  ADLs/Self Care Home Management;Cryotherapy;Electrical Stimulation;Gait training;Neuromuscular re-education;Balance training;Therapeutic exercise;Therapeutic activities;Functional mobility training;Stair training;Patient/family education;Manual techniques;Vasopneumatic Device    PT Next Visit Plan  D/C next week       Patient will benefit from skilled therapeutic intervention in order to improve the following deficits and impairments:  Abnormal gait, Decreased activity tolerance, Decreased balance, Decreased mobility, Decreased strength, Increased edema, Impaired flexibility, Pain, Cardiopulmonary status limiting  activity, Decreased endurance, Decreased range of motion, Difficulty walking  Visit Diagnosis: Acute pain of left knee  Localized edema  Difficulty in walking, not elsewhere classified  Stiffness of left knee, not elsewhere classified     Problem List Patient Active Problem List   Diagnosis Date Noted  . Primary osteoarthritis of right knee 06/07/2017  . Primary osteoarthritis of left knee 06/07/2017  . Effusion, left knee 06/07/2017    Barbra Miner,ANGIE PTA 10/31/2017, 9:02 AM  [facility] [place] [address] [place], [place], [address] Phone: [phone]   Fax:  [phone]  Name: Garth SchlatterChester K Swails MRN: [id number] Date of Birth: [birth year]

## 2017-11-05 ENCOUNTER — Ambulatory Visit: Payer: Medicare Other | Admitting: Physical Therapy

## 2017-11-05 DIAGNOSIS — M25562 Pain in left knee: Secondary | ICD-10-CM

## 2017-11-05 DIAGNOSIS — M25662 Stiffness of left knee, not elsewhere classified: Secondary | ICD-10-CM

## 2017-11-05 DIAGNOSIS — R262 Difficulty in walking, not elsewhere classified: Secondary | ICD-10-CM

## 2017-11-05 NOTE — Therapy (Signed)
[facility] [place] [address] [place], [place], [address] Phone: [phone]   Fax:  [phone]  Physical Therapy Treatment  Patient Details  Name: Peter Roth MRN: [id number] Date of Birth: 1961-03-28 Referring Provider: Luiz Blare   Encounter Date: 11/05/2017  PT End of Session - 11/05/17 1147    Visit Number  17    Date for PT Re-Evaluation  11/10/17    PT Start Time  0915    PT Stop Time  1015    PT Time Calculation (min)  60 min       Past Medical History:  Diagnosis Date  . Arthritis   . Bipolar 1 disorder ([facility])   . Hyperlipidemia   . Hypertension   . Stress headaches   . Type 2 diabetes mellitus ([facility])    followed by pcp  . Wears glasses     Past Surgical History:  Procedure Laterality Date  . KNEE ARTHROSCOPY Right ?date  . TOTAL KNEE ARTHROPLASTY Right 06/07/2017   Procedure: RIGHT TOTAL KNEE ARTHROPLASTY, INJECTION LEFT KNEE WITH FLUID ASPIRATION;  Surgeon: Jodi Geralds, MD;  [facility];  Service: Orthopedics;  Laterality: Right;  . TOTAL KNEE ARTHROPLASTY Left 08/23/2017   Procedure: LEFT TOTAL KNEE ARTHROPLASTY;  Surgeon: Jodi Geralds, MD;  [facility];  Service: Orthopedics;  Laterality: Left;    There were no vitals filed for this visit.  Subjective Assessment - 11/05/17 0920    Subjective  knees are sore but I stay busy    Currently in Pain?  Yes    Pain Score  2     Pain Location  Knee    Pain Orientation  Left;Right                       [facility] Adult PT Treatment/Exercise - 11/05/17 0001      Knee/Hip Exercises: Aerobic   Recumbent Bike  6 min full rev    Nustep  L 5 6 min      Knee/Hip Exercises: Machines for Strengthening   Cybex Knee Extension  10lb 3x10     Cybex Knee Flexion  20# 2 sets 15, 15# left leg only    Cybex Leg Press  50# 2x15      Knee/Hip Exercises: Standing   Heel Raises  Both;20 reps black bar    Forward Step Up  Both;10 reps;Hand  Hold: 2;Step Height: 6" up/over and back    Wall Squat  10 reps;3 seconds    Walking with Sports Cord  Black TB in hallway     Other Standing Knee Exercises  dead lift on airex 2 sets 10 5#      Vasopneumatic   Number Minutes Vasopneumatic   --    Vasopnuematic Location   --    Vasopneumatic Pressure  --    Vasopneumatic Temperature   --      Manual Therapy   Manual Therapy  Passive ROM;Soft tissue mobilization    Soft tissue mobilization  knee and quad    Passive ROM  ext               PT Short Term Goals - 09/17/17 0843      PT SHORT TERM GOAL #1   Title  assure independent with HEP    Status  Achieved        PT Long Term Goals - 10/29/17 0844      PT LONG TERM  GOAL #1   Title  increase AROM to 5-114 degrees flexion    Status  Achieved      PT LONG TERM GOAL #2   Title  walk without deviation all community distances    Status  Achieved      PT LONG TERM GOAL #3   Title  decrease pain 50%    Status  Achieved      PT LONG TERM GOAL #4   Title  go up and down stairs step over step    Status  Achieved            Plan - 11/05/17 1147    Clinical Impression Statement  increased func. strengtha nd ex today. pt declined vaso as he was headed OOT and would ice later    PT Treatment/Interventions  ADLs/Self Care Home Management;Cryotherapy;Electrical Stimulation;Gait training;Neuromuscular re-education;Balance training;Therapeutic exercise;Therapeutic activities;Functional mobility training;Stair training;Patient/family education;Manual techniques;Vasopneumatic Device    PT Next Visit Plan  D/C next visit       Patient will benefit from skilled therapeutic intervention in order to improve the following deficits and impairments:  Abnormal gait, Decreased activity tolerance, Decreased balance, Decreased mobility, Decreased strength, Increased edema, Impaired flexibility, Pain, Cardiopulmonary status limiting activity, Decreased endurance, Decreased range of  motion, Difficulty walking  Visit Diagnosis: Acute pain of left knee  Difficulty in walking, not elsewhere classified  Stiffness of left knee, not elsewhere classified     Problem List Patient Active Problem List   Diagnosis Date Noted  . Primary osteoarthritis of right knee 06/07/2017  . Primary osteoarthritis of left knee 06/07/2017  . Effusion, left knee 06/07/2017    Damondre Pfeifle,ANGIE PTA 11/05/2017, 11:55 AM  [facility] [place] [address] [place], [place], [address] Phone: [phone]   Fax:  [phone]  Name: AVRY ROEDL MRN: [id number] Date of Birth: 1960/10/13

## 2017-11-08 ENCOUNTER — Ambulatory Visit: Payer: Medicare Other | Attending: Orthopedic Surgery | Admitting: Physical Therapy

## 2017-11-08 DIAGNOSIS — M25662 Stiffness of left knee, not elsewhere classified: Secondary | ICD-10-CM | POA: Insufficient documentation

## 2017-11-08 DIAGNOSIS — R600 Localized edema: Secondary | ICD-10-CM | POA: Insufficient documentation

## 2017-11-08 DIAGNOSIS — R262 Difficulty in walking, not elsewhere classified: Secondary | ICD-10-CM | POA: Diagnosis present

## 2017-11-08 DIAGNOSIS — M25562 Pain in left knee: Secondary | ICD-10-CM | POA: Diagnosis not present

## 2017-11-08 NOTE — Therapy (Signed)
[address] [place], [place], [address] Phone: [phone]   Fax:  [phone]  Physical Therapy Treatment  Patient Details  Name: Peter Roth MRN: [id number] Date of Birth: 11-Aug-1960 Referring Provider: Berenice Primas   Encounter Date: 11/08/2017  PT End of Session - 11/08/17 0852    Visit Number  18    Date for PT Re-Evaluation  11/10/17    PT Start Time  0900    PT Stop Time  0950    PT Time Calculation (min)  50 min       Past Medical History:  Diagnosis Date  . Arthritis   . Bipolar 1 disorder ([place])   . Hyperlipidemia   . Hypertension   . Stress headaches   . Type 2 diabetes mellitus ([place])    followed by pcp  . Wears glasses     Past Surgical History:  Procedure Laterality Date  . KNEE ARTHROSCOPY Right ?date  . TOTAL KNEE ARTHROPLASTY Right 06/07/2017   Procedure: RIGHT TOTAL KNEE ARTHROPLASTY, INJECTION LEFT KNEE WITH FLUID ASPIRATION;  Surgeon: Dorna Leitz, MD;  Location: [facility];  Service: Orthopedics;  Laterality: Right;  . TOTAL KNEE ARTHROPLASTY Left 08/23/2017   Procedure: LEFT TOTAL KNEE ARTHROPLASTY;  Surgeon: Dorna Leitz, MD;  Location: [facility];  Service: Orthopedics;  Laterality: Left;    There were no vitals filed for this visit.  Subjective Assessment - 11/08/17 0851    Subjective  15 min late. pt reports sore but has been at beach    Currently in Pain?  Yes    Pain Score  3     Pain Location  Knee                       [facility] Adult PT Treatment/Exercise - 11/08/17 0001      Knee/Hip Exercises: Aerobic   Recumbent Bike  6 min full rev    Nustep  L 5 6 min      Knee/Hip Exercises: Machines for Strengthening   Cybex Knee Extension  10lb 3x10     Cybex Knee Flexion  20# 2 sets 15, 15# left leg only    Cybex Leg Press  50# 2x15      Knee/Hip Exercises: Standing   Heel Raises  Both;20 reps      Vasopneumatic   Number Minutes Vasopneumatic   15  minutes    Vasopnuematic Location   Knee    Vasopneumatic Pressure  Medium    Vasopneumatic Temperature   33               PT Short Term Goals - 09/17/17 0843      PT SHORT TERM GOAL #1   Title  assure independent with HEP    Status  Achieved        PT Long Term Goals - 10/29/17 0844      PT LONG TERM GOAL #1   Title  increase AROM to 5-114 degrees flexion    Status  Achieved      PT LONG TERM GOAL #2   Title  walk without deviation all community distances    Status  Achieved      PT LONG TERM GOAL #3   Title  decrease pain 50%    Status  Achieved      PT LONG TERM GOAL #4   Title  go up and down stairs step over step  Status  Achieved            Plan - 11/08/17 0853    Clinical Impression Statement  all goals met. pt independent with HEP    PT Treatment/Interventions  ADLs/Self Care Home Management;Cryotherapy;Electrical Stimulation;Gait training;Neuromuscular re-education;Balance training;Therapeutic exercise;Therapeutic activities;Functional mobility training;Stair training;Patient/family education;Manual techniques;Vasopneumatic Device    PT Next Visit Plan  D/C       Patient will benefit from skilled therapeutic intervention in order to improve the following deficits and impairments:  Abnormal gait, Decreased activity tolerance, Decreased balance, Decreased mobility, Decreased strength, Increased edema, Impaired flexibility, Pain, Cardiopulmonary status limiting activity, Decreased endurance, Decreased range of motion, Difficulty walking  Visit Diagnosis: Acute pain of left knee  Difficulty in walking, not elsewhere classified  Stiffness of left knee, not elsewhere classified  Localized edema     Problem List Patient Active Problem List   Diagnosis Date Noted  . Primary osteoarthritis of right knee 06/07/2017  . Primary osteoarthritis of left knee 06/07/2017  . Effusion, left knee 06/07/2017   PHYSICAL THERAPY DISCHARGE  SUMMARY   Plan: Patient agrees to discharge.  Patient goals were met. Patient is being discharged due to meeting the stated rehab goals.  ?????      Jatasia Gundrum,ANGIE PTA 11/08/2017, 8:54 AM  [address] [place], [place], [address] Phone: [phone]   Fax:  [phone]  Name: SHAMEEK NYQUIST MRN: [id number] Date of Birth: 1960-09-30

## 2017-11-30 ENCOUNTER — Encounter (HOSPITAL_COMMUNITY): Payer: Self-pay | Admitting: Emergency Medicine

## 2017-11-30 ENCOUNTER — Other Ambulatory Visit: Payer: Self-pay

## 2017-11-30 ENCOUNTER — Emergency Department (HOSPITAL_COMMUNITY)
Admission: EM | Admit: 2017-11-30 | Discharge: 2017-11-30 | Disposition: A | Discharge status: Home / Self Care | Payer: Medicare Other | Attending: Emergency Medicine | Admitting: Emergency Medicine

## 2017-11-30 DIAGNOSIS — Z7982 Long term (current) use of aspirin: Secondary | ICD-10-CM | POA: Insufficient documentation

## 2017-11-30 DIAGNOSIS — R202 Paresthesia of skin: Secondary | ICD-10-CM | POA: Diagnosis not present

## 2017-11-30 DIAGNOSIS — I10 Essential (primary) hypertension: Secondary | ICD-10-CM | POA: Diagnosis not present

## 2017-11-30 DIAGNOSIS — E119 Type 2 diabetes mellitus without complications: Secondary | ICD-10-CM | POA: Insufficient documentation

## 2017-11-30 DIAGNOSIS — F17210 Nicotine dependence, cigarettes, uncomplicated: Secondary | ICD-10-CM | POA: Diagnosis not present

## 2017-11-30 DIAGNOSIS — Z7984 Long term (current) use of oral hypoglycemic drugs: Secondary | ICD-10-CM | POA: Insufficient documentation

## 2017-11-30 DIAGNOSIS — Z79899 Other long term (current) drug therapy: Secondary | ICD-10-CM | POA: Insufficient documentation

## 2017-11-30 DIAGNOSIS — M79642 Pain in left hand: Secondary | ICD-10-CM | POA: Diagnosis not present

## 2017-11-30 MED ORDER — DOXYCYCLINE HYCLATE 100 MG PO TABS
100.0000 mg | ORAL_TABLET | Freq: Once | ORAL | Status: AC
  Administered 2017-11-30: 100 mg via ORAL
  Filled 2017-11-30: qty 1

## 2017-11-30 MED ORDER — DOXYCYCLINE HYCLATE 100 MG PO CAPS: 100.0000 mg | ORAL_CAPSULE | Freq: Two times a day (BID) | ORAL | 0 refills | Status: DC

## 2017-11-30 MED ORDER — DOXYCYCLINE HYCLATE 100 MG PO CAPS: 100.0000 mg | ORAL_CAPSULE | Freq: Two times a day (BID) | ORAL | 0 refills | Status: AC

## 2017-11-30 NOTE — ED Triage Notes (Addendum)
Pt states Thursday his left wrist started to bother him and since then the pain has moved down into his hand and now it is swollen  Pt states it is hard to move his fingers

## 2017-11-30 NOTE — ED Provider Notes (Signed)
[facility] Provider Note: Peter Dell, MD, FACEP  CSN: [id number] MRN: [id number] ARRIVAL: 11/30/17 at 0548 ROOM: WA10/WA10   CHIEF COMPLAINT  Hand Pain   HISTORY OF PRESENT ILLNESS  11/30/17 6:33 AM Peter Roth is a 57 y.o. male thinks he may have been bitten by something on the back of his left hand 2 days ago.  He is having pain and swelling of the left wrist and dorsal left hand overlying the second and third metacarpals.  He is having some difficulty moving his second and third fingers.  He describes the pain as mild and tingling in nature.  He has been wearing a wrist splint for comfort.  He has not had fever or chills.  He has a sister with gout and is concerned he may be having a gout attack.   Past Medical History:  Diagnosis Date  . Arthritis   . Bipolar 1 disorder ([facility])   . Hyperlipidemia   . Hypertension   . Stress headaches   . Type 2 diabetes mellitus ([facility])    followed by pcp  . Wears glasses     Past Surgical History:  Procedure Laterality Date  . KNEE ARTHROSCOPY Right ?date  . TOTAL KNEE ARTHROPLASTY Right 06/07/2017   Procedure: RIGHT TOTAL KNEE ARTHROPLASTY, INJECTION LEFT KNEE WITH FLUID ASPIRATION;  Surgeon: Jodi Geralds, MD;  [facility];  Service: Orthopedics;  Laterality: Right;  . TOTAL KNEE ARTHROPLASTY Left 08/23/2017   Procedure: LEFT TOTAL KNEE ARTHROPLASTY;  Surgeon: Jodi Geralds, MD;  Location: [facility];  Service: Orthopedics;  Laterality: Left;    History reviewed. No pertinent family history.  Social History   Tobacco Use  . Smoking status: Current Every Day Smoker    Packs/day: 0.50    Years: 40.00    Pack years: 20.00    Types: Cigarettes  . Smokeless tobacco: Never Used  Substance Use Topics  . Alcohol use: No  . Drug use: No    Prior to Admission medications   Medication Sig Start Date End Date Taking? Authorizing Provider  aspirin EC 325 MG tablet Take 1 tablet (325 mg total) by mouth 2 (two) times daily  after a meal. Take x 1 month post op to decrease risk of blood clots. 08/23/17   Marshia Ly, PA-C  atorvastatin (LIPITOR) 40 MG tablet Take 40 mg by mouth daily. 06/23/15   [provider]  divalproex (DEPAKOTE ER) 500 MG 24 hr tablet Take 1,000 mg by mouth at bedtime.    [provider]  docusate sodium (COLACE) 100 MG capsule Take 1 capsule (100 mg total) by mouth 2 (two) times daily. Patient taking differently: Take 100 mg by mouth 2 (two) times daily as needed for mild constipation.  06/07/17   Marshia Ly, PA-C  lisinopril (PRINIVIL,ZESTRIL) 10 MG tablet Take 10 mg daily by mouth.    [provider]  omeprazole (PRILOSEC) 40 MG capsule Take 40 mg by mouth daily as needed (acid reflux).     [provider]  oxyCODONE-acetaminophen (PERCOCET/ROXICET) 5-325 MG tablet Take 1-2 tablets by mouth every 4 (four) hours as needed for severe pain. 08/23/17   Marshia Ly, PA-C  SitaGLIPtin-MetFORMIN HCl (JANUMET XR) 50-500 MG TB24 Take 1 tablet by mouth daily.    [provider]  tiZANidine (ZANAFLEX) 2 MG tablet Take 1 tablet (2 mg total) by mouth every 8 (eight) hours as needed for muscle spasms. 08/23/17   Marshia Ly, PA-C    Allergies Sulfa  antibiotics and Cephalosporins   REVIEW OF SYSTEMS  Negative except as noted here or in the History of Present Illness.   PHYSICAL EXAMINATION  Initial Vital Signs Blood pressure (!) 154/99, pulse 91, temperature 98.1 F (36.7 C), temperature source Oral, resp. rate 16, SpO2 99 %.  Examination General: Well-developed, well-nourished male in no acute distress; appearance consistent with age of record HENT: normocephalic; atraumatic Eyes: Normal appearance Neck: supple Heart: regular rate and rhythm Lungs: clear to auscultation bilaterally Abdomen: soft; nondistended Extremities: No deformity; full range of motion except mildly decreased range of motion at the left second and third MCP joints;  mild tenderness and swelling of the dorsal left hand without significant erythema or warmth, minimal pain on palpation or passive range of motion of left wrist; pulses normal Neurologic: Awake, alert and oriented; motor function intact in all extremities and symmetric; no facial droop Skin: Warm and dry Psychiatric: Normal mood and affect   RESULTS  Summary of this visit's results, reviewed by myself:   EKG Interpretation  Date/Time:    Ventricular Rate:    PR Interval:    QRS Duration:   QT Interval:    QTC Calculation:   R Axis:     Text Interpretation:        Laboratory Studies: No results found for this or any previous visit (from the past 24 hour(s)). Imaging Studies: No results found.  ED COURSE and MDM  Nursing notes and initial vitals signs, including pulse oximetry, reviewed.  Vitals:   11/30/17 0607  BP: (!) 154/99  Pulse: 91  Resp: 16  Temp: 98.1 F (36.7 C)  TempSrc: Oral  SpO2: 99%   The patient's examination shows no significant pain on movement of the left wrist.  I do not believe this is gout or septic joint.  There is some swelling and tenderness of the dorsal left hand which could represent an early cellulitis status post an insect bite.  We will treat with doxycycline and he was advised to take over-the-counter NSAIDs for pain and swelling.  PROCEDURES    ED DIAGNOSES     ICD-10-CM   1. Left hand pain M79.642        Paula Libra, MD 11/30/17 [phone]

## 2018-01-23 ENCOUNTER — Other Ambulatory Visit: Payer: Self-pay

## 2018-01-23 ENCOUNTER — Ambulatory Visit: Payer: Medicare Other | Attending: Orthopedic Surgery | Admitting: Physical Therapy

## 2018-01-23 ENCOUNTER — Encounter: Payer: Self-pay | Admitting: Physical Therapy

## 2018-01-23 DIAGNOSIS — M25662 Stiffness of left knee, not elsewhere classified: Secondary | ICD-10-CM

## 2018-01-23 DIAGNOSIS — M25562 Pain in left knee: Secondary | ICD-10-CM | POA: Diagnosis not present

## 2018-01-23 DIAGNOSIS — R262 Difficulty in walking, not elsewhere classified: Secondary | ICD-10-CM | POA: Diagnosis present

## 2018-01-23 DIAGNOSIS — M25661 Stiffness of right knee, not elsewhere classified: Secondary | ICD-10-CM

## 2018-01-23 DIAGNOSIS — R600 Localized edema: Secondary | ICD-10-CM

## 2018-01-23 DIAGNOSIS — M25561 Pain in right knee: Secondary | ICD-10-CM

## 2018-01-23 NOTE — Therapy (Signed)
[address] [place], [place], [address] Phone: [phone]   Fax:  [phone]  Physical Therapy Evaluation  Patient Details  Name: Peter Roth MRN: [id number] Date of Birth: 1960-10-07 Referring Provider: Berenice Primas   Encounter Date: 01/23/2018  PT End of Session - 01/23/18 1554    Visit Number  1    Date for PT Re-Evaluation  03/26/18    PT Start Time  1520    PT Stop Time  1610    PT Time Calculation (min)  50 min    Activity Tolerance  Patient tolerated treatment well    Behavior During Therapy  [facility] for tasks assessed/performed       Past Medical History:  Diagnosis Date  . Arthritis   . Bipolar 1 disorder ([place])   . Hyperlipidemia   . Hypertension   . Stress headaches   . Type 2 diabetes mellitus ([place])    followed by pcp  . Wears glasses     Past Surgical History:  Procedure Laterality Date  . KNEE ARTHROSCOPY Right ?date  . TOTAL KNEE ARTHROPLASTY Right 06/07/2017   Procedure: RIGHT TOTAL KNEE ARTHROPLASTY, INJECTION LEFT KNEE WITH FLUID ASPIRATION;  Surgeon: Dorna Leitz, MD;  [facility];  Service: Orthopedics;  Laterality: Right;  . TOTAL KNEE ARTHROPLASTY Left 08/23/2017   Procedure: LEFT TOTAL KNEE ARTHROPLASTY;  Surgeon: Dorna Leitz, MD;  [facility];  Service: Orthopedics;  Laterality: Left;    There were no vitals filed for this visit.   Subjective Assessment - 01/23/18 1523    Subjective  Patient has history of Right TKR 9 months ago and left TKR 5 months ago.  He did great after PT here with good ROM and walking and less pain.  Patient reports to me that his pain just never went away, reports that he feels that the pain has been getting worse.  Reports mowing yard causes pain    Limitations  Walking    Patient Stated Goals  have less pain, walk better    Currently in Pain?  Yes    Pain Score  5     Pain Location  Knee    Pain Orientation  Right;Left    Pain  Descriptors / Indicators  Tingling    Pain Type  Acute pain    Pain Onset  More than a month ago    Pain Frequency  Constant    Aggravating Factors   difficulty walking, difficulty getting up from chair, mowing yard pain up to 8/10    Pain Relieving Factors  rest  ibuprofen pain can be down to a 2-3/10    Effect of Pain on Daily Activities  diffiuclty stairs, mowing yard, getting up from sitting         [facility] PT Assessment - 01/23/18 0001      Assessment   Medical Diagnosis  bilateral knee pain    Referring Provider  Graves    Onset Date/Surgical Date  12/24/17    Prior Therapy  PT here in the past most recent 3 months ago      Precautions   Precautions  None      Balance Screen   Has the patient fallen in the past 6 months  No    Has the patient had a decrease in activity level because of a fear of falling?   No    Is the patient reluctant to leave their home because of  a fear of falling?   No      Home Environment   Additional Comments  no stairs, did housework, did yardwork      Prior Function   Level of Independence  Independent    Vocation  On disability    Leisure  no exercise      Circumferential Edema   Circumferential - Right  41 cm    Circumferential - Left   42 cm      AROM   Right Knee Extension  7    Right Knee Flexion  90    Left Knee Extension  11    Left Knee Flexion  100      PROM   Right Knee Extension  2    Right Knee Flexion  98    Left Knee Extension  8    Left Knee Flexion  105      Strength   Overall Strength Comments  4/5      Palpation   Palpation comment  mild warmth to both knees, left knee seems to have some increased swelling and puffiness      Ambulation/Gait   Gait Comments  no device, has an antalgic gait on the right, leans to the right iwth weight bearing.  On stairs he does one at a time going down, can do step over step going up                Objective measurements completed on examination: See above findings.       [facility] Adult PT Treatment/Exercise - 01/23/18 0001      Knee/Hip Exercises: Aerobic   Recumbent Bike  5 min full rev, very stiff with a lot of compensation    Nustep  L 4 6 min      Cryotherapy   Number Minutes Cryotherapy  15 Minutes    Cryotherapy Location  Knee    Type of Cryotherapy  Ice pack               PT Short Term Goals - 01/23/18 1556      PT SHORT TERM GOAL #1   Title  assure independent with HEP    Time  2    Period  Weeks    Status  Achieved        PT Long Term Goals - 01/23/18 1556      PT LONG TERM GOAL #1   Title  increase AROM to 5-115 degrees flexion    Time  8    Period  Weeks    Status  New      PT LONG TERM GOAL #2   Title  walk without deviation all community distances    Time  8    Period  Weeks    Status  New      PT LONG TERM GOAL #3   Title  decrease pain 50%    Time  8    Period  Weeks    Status  New      PT LONG TERM GOAL #4   Title  go up and down stairs step over step    Time  8    Period  Weeks    Status  New      PT LONG TERM GOAL #5   Title  mow yard without difficulty    Time  8    Period  Weeks    Status  New  Plan - 01/23/18 1554    Clinical Impression Statement  Patient had the right knee replaced 9 months ago and the left knee replaced 5 months ago, he had PT here, all goals were met and we discharged him.  He returns with c/o stiffness and pain.  His ROM is definitely limited compared to when we discharged him, he goes down stairs one at a time and at d/c he was going step over step.      Clinical Presentation  Stable    Clinical Decision Making  Low    Rehab Potential  Good    PT Frequency  2x / week    PT Duration  8 weeks    PT Treatment/Interventions  ADLs/Self Care Home Management;Cryotherapy;Electrical Stimulation;Gait training;Neuromuscular re-education;Balance training;Therapeutic exercise;Therapeutic activities;Functional mobility training;Stair training;Patient/family  education;Manual techniques;Vasopneumatic Device    PT Next Visit Plan  return to exercises to loosen up the knees and see if this helps his pain and function    Consulted and Agree with Plan of Care  Patient       Patient will benefit from skilled therapeutic intervention in order to improve the following deficits and impairments:  Abnormal gait, Decreased activity tolerance, Decreased balance, Decreased mobility, Decreased strength, Increased edema, Impaired flexibility, Pain, Cardiopulmonary status limiting activity, Decreased endurance, Decreased range of motion, Difficulty walking  Visit Diagnosis: Acute pain of left knee - Plan: PT plan of care cert/re-cert  Difficulty in walking, not elsewhere classified - Plan: PT plan of care cert/re-cert  Stiffness of left knee, not elsewhere classified - Plan: PT plan of care cert/re-cert  Localized edema - Plan: PT plan of care cert/re-cert  Acute pain of right knee - Plan: PT plan of care cert/re-cert  Stiffness of right knee, not elsewhere classified - Plan: PT plan of care cert/re-cert     Problem List Patient Active Problem List   Diagnosis Date Noted  . Primary osteoarthritis of right knee 06/07/2017  . Primary osteoarthritis of left knee 06/07/2017  . Effusion, left knee 06/07/2017    Sumner Boast., PT 01/23/2018, 3:59 PM  [address] [place], [place], [address] Phone: [phone]   Fax:  [phone]  Name: TRAMAR BRUECKNER MRN: [id number] Date of Birth: [birth year]

## 2018-01-28 ENCOUNTER — Ambulatory Visit: Payer: Medicare Other | Admitting: Physical Therapy

## 2018-01-28 ENCOUNTER — Encounter: Payer: Self-pay | Admitting: Physical Therapy

## 2018-01-28 DIAGNOSIS — M25562 Pain in left knee: Secondary | ICD-10-CM | POA: Diagnosis not present

## 2018-01-28 DIAGNOSIS — M25662 Stiffness of left knee, not elsewhere classified: Secondary | ICD-10-CM

## 2018-01-28 DIAGNOSIS — M25661 Stiffness of right knee, not elsewhere classified: Secondary | ICD-10-CM

## 2018-01-28 DIAGNOSIS — R600 Localized edema: Secondary | ICD-10-CM

## 2018-01-28 DIAGNOSIS — M25561 Pain in right knee: Secondary | ICD-10-CM

## 2018-01-28 DIAGNOSIS — R262 Difficulty in walking, not elsewhere classified: Secondary | ICD-10-CM

## 2018-01-28 NOTE — Therapy (Signed)
[facility] [address] [place], [place], [address] Phone: [phone]   Fax:  [phone]  Physical Therapy Treatment  Patient Details  Name: Peter SchlatterChester K Roth MRN: [id number] Date of Birth: 10/19/1960 Referring Provider: Luiz BlareGraves   Encounter Date: 01/28/2018  PT End of Session - 01/28/18 1117    Visit Number  2    Date for PT Re-Evaluation  03/26/18    PT Start Time  1015    PT Stop Time  1115    PT Time Calculation (min)  60 min       Past Medical History:  Diagnosis Date  . Arthritis   . Bipolar 1 disorder ([facility])   . Hyperlipidemia   . Hypertension   . Stress headaches   . Type 2 diabetes mellitus ([facility])    followed by pcp  . Wears glasses     Past Surgical History:  Procedure Laterality Date  . KNEE ARTHROSCOPY Right ?date  . TOTAL KNEE ARTHROPLASTY Right 06/07/2017   Procedure: RIGHT TOTAL KNEE ARTHROPLASTY, INJECTION LEFT KNEE WITH FLUID ASPIRATION;  Surgeon: Jodi GeraldsGraves, John, MD;  Location: [facility];  Service: Orthopedics;  Laterality: Right;  . TOTAL KNEE ARTHROPLASTY Left 08/23/2017   Procedure: LEFT TOTAL KNEE ARTHROPLASTY;  Surgeon: Jodi GeraldsGraves, John, MD;  Location: [facility];  Service: Orthopedics;  Laterality: Left;    There were no vitals filed for this visit.  Subjective Assessment - 01/28/18 1017    Subjective  sore and achey this morning esp with the rain    Currently in Pain?  Yes    Pain Score  4     Pain Location  Knee    Pain Orientation  Right;Left                       [facility] Adult PT Treatment/Exercise - 01/28/18 0001      Knee/Hip Exercises: Aerobic   Recumbent Bike  6 min full rev, very stiff with a lot of compensation    Nustep  L 4 6 min LE only      Knee/Hip Exercises: Machines for Strengthening   Cybex Knee Extension  10lb 3x10     Cybex Knee Flexion  20# 2 sets 15    Cybex Leg Press  40# 2 sets 15      Knee/Hip Exercises: Standing   Heel Raises  Both;20 reps  black bar      Cryotherapy   Number Minutes Cryotherapy  15 Minutes    Cryotherapy Location  Knee    Type of Cryotherapy  Ice massage;Ice pack      Teacher, early years/prelectrical Stimulation   Electrical Stimulation Location  BIL knee    Electrical Stimulation Action  premod    Electrical Stimulation Goals  Pain      Manual Therapy   Manual Therapy  Passive ROM;Joint mobilization    Joint Mobilization  flex and ext to increase ROM    Passive ROM  flex/ext               PT Short Term Goals - 01/23/18 1556      PT SHORT TERM GOAL #1   Title  assure independent with HEP    Time  2    Period  Weeks    Status  Achieved        PT Long Term Goals - 01/23/18 1556      PT LONG TERM GOAL #1   Title  increase  AROM to 5-115 degrees flexion    Time  8    Period  Weeks    Status  New      PT LONG TERM GOAL #2   Title  walk without deviation all community distances    Time  8    Period  Weeks    Status  New      PT LONG TERM GOAL #3   Title  decrease pain 50%    Time  8    Period  Weeks    Status  New      PT LONG TERM GOAL #4   Title  go up and down stairs step over step    Time  8    Period  Weeks    Status  New      PT LONG TERM GOAL #5   Title  mow yard without difficulty    Time  8    Period  Weeks    Status  New            Plan - 01/28/18 1118    Clinical Impression Statement  pt tolerated interventions well, but needed cuing for full ROM and control of mvmt with ex. increased ROM with MT ( RT tighter than LEft)    PT Treatment/Interventions  ADLs/Self Care Home Management;Cryotherapy;Electrical Stimulation;Gait training;Neuromuscular re-education;Balance training;Therapeutic exercise;Therapeutic activities;Functional mobility training;Stair training;Patient/family education;Manual techniques;Vasopneumatic Device    PT Next Visit Plan  return to exercises to loosen up the knees and see if this helps his pain and function       Patient will benefit from skilled  therapeutic intervention in order to improve the following deficits and impairments:  Abnormal gait, Decreased activity tolerance, Decreased balance, Decreased mobility, Decreased strength, Increased edema, Impaired flexibility, Pain, Cardiopulmonary status limiting activity, Decreased endurance, Decreased range of motion, Difficulty walking  Visit Diagnosis: Acute pain of left knee  Difficulty in walking, not elsewhere classified  Stiffness of left knee, not elsewhere classified  Localized edema  Acute pain of right knee  Stiffness of right knee, not elsewhere classified     Problem List Patient Active Problem List   Diagnosis Date Noted  . Primary osteoarthritis of right knee 06/07/2017  . Primary osteoarthritis of left knee 06/07/2017  . Effusion, left knee 06/07/2017    Peter Roth,ANGIE PTA 01/28/2018, 11:19 AM  [facility] [place] [address] [place], [place], [address] Phone: [phone]   Fax:  [phone]  Name: Peter Roth MRN: [id number] Date of Birth: 02-Aug-1960

## 2018-01-30 ENCOUNTER — Encounter: Payer: Self-pay | Admitting: Physical Therapy

## 2018-01-30 ENCOUNTER — Ambulatory Visit: Payer: Medicare Other | Admitting: Physical Therapy

## 2018-01-30 DIAGNOSIS — M25562 Pain in left knee: Secondary | ICD-10-CM

## 2018-01-30 DIAGNOSIS — M25661 Stiffness of right knee, not elsewhere classified: Secondary | ICD-10-CM

## 2018-01-30 DIAGNOSIS — R262 Difficulty in walking, not elsewhere classified: Secondary | ICD-10-CM

## 2018-01-30 DIAGNOSIS — R600 Localized edema: Secondary | ICD-10-CM

## 2018-01-30 DIAGNOSIS — M25561 Pain in right knee: Secondary | ICD-10-CM

## 2018-01-30 DIAGNOSIS — M25662 Stiffness of left knee, not elsewhere classified: Secondary | ICD-10-CM

## 2018-01-30 NOTE — Therapy (Signed)
[facility] [address] [place], [place], [address] Phone: [phone]   Fax:  [phone]  Physical Therapy Treatment  Patient Details  Name: Peter Roth MRN: [id number] Date of Birth: 11/09/1960 Referring Provider: Luiz Blare   Encounter Date: 01/30/2018  PT End of Session - 01/30/18 0907    Visit Number  3    Date for PT Re-Evaluation  03/26/18    PT Start Time  0800    PT Stop Time  0900    PT Time Calculation (min)  60 min       Past Medical History:  Diagnosis Date  . Arthritis   . Bipolar 1 disorder (HCC)   . Hyperlipidemia   . Hypertension   . Stress headaches   . Type 2 diabetes mellitus (HCC)    followed by pcp  . Wears glasses     Past Surgical History:  Procedure Laterality Date  . KNEE ARTHROSCOPY Right ?date  . TOTAL KNEE ARTHROPLASTY Right 06/07/2017   Procedure: RIGHT TOTAL KNEE ARTHROPLASTY, INJECTION LEFT KNEE WITH FLUID ASPIRATION;  Surgeon: Jodi Geralds, MD;  Location: [facility];  Service: Orthopedics;  Laterality: Right;  . TOTAL KNEE ARTHROPLASTY Left 08/23/2017   Procedure: LEFT TOTAL KNEE ARTHROPLASTY;  Surgeon: Jodi Geralds, MD;  Location: [facility];  Service: Orthopedics;  Laterality: Left;    There were no vitals filed for this visit.  Subjective Assessment - 01/30/18 0903    Subjective  felt good for alittle while after last session then tightened back up    Pain Score  5     Pain Location  Knee    Pain Orientation  Right;Left                       [facility] Adult PT Treatment/Exercise - 01/30/18 0001      Knee/Hip Exercises: Aerobic   Recumbent Bike  6 min full rev    Nustep  L 4 6 min LE only      Knee/Hip Exercises: Machines for Strengthening   Cybex Knee Extension  10lb 2 sets 10, SL 5 # 10 each    Cybex Knee Flexion  20# 2 sets 10 , SL 15# 10 each     Cybex Leg Press  40# 2 sets 15 calf raises 40# 2 sets 15      Knee/Hip Exercises: Standing   Lateral  Step Up  Both;10 reps;Hand Hold: 1;Step Height: 6"    Forward Step Up  Both;1 set;10 reps;Hand Hold: 2;Step Height: 6"      Electrical Stimulation   Electrical Stimulation Location  BIL knee with ice pack    Electrical Stimulation Action  premod    Electrical Stimulation Goals  Pain      Manual Therapy   Manual Therapy  Passive ROM;Joint mobilization    Joint Mobilization  flex and ext to increase ROM    Soft tissue mobilization  to quad to decrease tightness to increase ROM    Passive ROM  flex/ext               PT Short Term Goals - 01/23/18 1556      PT SHORT TERM GOAL #1   Title  assure independent with HEP    Time  2    Period  Weeks    Status  Achieved        PT Long Term Goals - 01/30/18 6962  PT LONG TERM GOAL #1   Title  increase AROM to 5-115 degrees flexion    Status  On-going      PT LONG TERM GOAL #2   Title  walk without deviation all community distances    Status  On-going      PT LONG TERM GOAL #3   Title  decrease pain 50%    Status  On-going      PT LONG TERM GOAL #4   Title  go up and down stairs step over step    Status  On-going      PT LONG TERM GOAL #5   Title  mow yard without difficulty    Status  On-going            Plan - 01/30/18 0908    Clinical Impression Statement  pt with increased func ROm as noted but ease on bike as well as less end range tightness on machines. cuing to control mvmt and increase full ROM. progressing towards all goals    PT Treatment/Interventions  ADLs/Self Care Home Management;Cryotherapy;Electrical Stimulation;Gait training;Neuromuscular re-education;Balance training;Therapeutic exercise;Therapeutic activities;Functional mobility training;Stair training;Patient/family education;Manual techniques;Vasopneumatic Device    PT Next Visit Plan  return to exercises to loosen up the knees and see if this helps his pain and function       Patient will benefit from skilled therapeutic intervention  in order to improve the following deficits and impairments:  Abnormal gait, Decreased activity tolerance, Decreased balance, Decreased mobility, Decreased strength, Increased edema, Impaired flexibility, Pain, Cardiopulmonary status limiting activity, Decreased endurance, Decreased range of motion, Difficulty walking  Visit Diagnosis: Acute pain of left knee  Difficulty in walking, not elsewhere classified  Stiffness of left knee, not elsewhere classified  Localized edema  Acute pain of right knee  Stiffness of right knee, not elsewhere classified     Problem List Patient Active Problem List   Diagnosis Date Noted  . Primary osteoarthritis of right knee 06/07/2017  . Primary osteoarthritis of left knee 06/07/2017  . Effusion, left knee 06/07/2017    Laddie Math,ANGIE PTA 01/30/2018, 9:10 AM  [facility] [address] [place], [place], [address] Phone: [phone]   Fax:  [phone]  Name: Peter Roth MRN: [id number] Date of Birth: 04-Apr-1961

## 2018-02-04 ENCOUNTER — Ambulatory Visit: Payer: Medicare Other | Admitting: Physical Therapy

## 2018-02-04 DIAGNOSIS — R262 Difficulty in walking, not elsewhere classified: Secondary | ICD-10-CM

## 2018-02-04 DIAGNOSIS — M25562 Pain in left knee: Secondary | ICD-10-CM | POA: Diagnosis not present

## 2018-02-04 DIAGNOSIS — M25662 Stiffness of left knee, not elsewhere classified: Secondary | ICD-10-CM

## 2018-02-04 DIAGNOSIS — M25561 Pain in right knee: Secondary | ICD-10-CM

## 2018-02-04 DIAGNOSIS — R600 Localized edema: Secondary | ICD-10-CM

## 2018-02-04 DIAGNOSIS — M25661 Stiffness of right knee, not elsewhere classified: Secondary | ICD-10-CM

## 2018-02-04 NOTE — Therapy (Signed)
[facility] [address] [place], [place], [address] Phone: [phone]   Fax:  [phone]  Physical Therapy Treatment  Patient Details  Name: Peter SchlatterChester K Roth MRN: [id number] Date of Birth: 1960/10/18 Referring Provider: Luiz BlareGraves   Encounter Date: 02/04/2018  PT End of Session - 02/04/18 0845    Visit Number  4    Date for PT Re-Evaluation  03/26/18    PT Start Time  0759    PT Stop Time  0857    PT Time Calculation (min)  58 min    Activity Tolerance  Patient tolerated treatment well    Behavior During Therapy  [facility] for tasks assessed/performed       Past Medical History:  Diagnosis Date  . Arthritis   . Bipolar 1 disorder (HCC)   . Hyperlipidemia   . Hypertension   . Stress headaches   . Type 2 diabetes mellitus (HCC)    followed by pcp  . Wears glasses     Past Surgical History:  Procedure Laterality Date  . KNEE ARTHROSCOPY Right ?date  . TOTAL KNEE ARTHROPLASTY Right 06/07/2017   Procedure: RIGHT TOTAL KNEE ARTHROPLASTY, INJECTION LEFT KNEE WITH FLUID ASPIRATION;  Surgeon: Jodi GeraldsGraves, John, MD;  [facility];  Service: Orthopedics;  Laterality: Right;  . TOTAL KNEE ARTHROPLASTY Left 08/23/2017   Procedure: LEFT TOTAL KNEE ARTHROPLASTY;  Surgeon: Jodi GeraldsGraves, John, MD;  [facility];  Service: Orthopedics;  Laterality: Left;    There were no vitals filed for this visit.  Subjective Assessment - 02/04/18 0803    Subjective  Pt reports doing okay and having had a good weekend.     Currently in Pain?  No/denies                       [facility]/Exercise - 02/04/18 0001      Knee/Hip Exercises: Aerobic   Recumbent Bike  6 min full rev    Nustep  L 4 6 min      Knee/Hip Exercises: Machines for Strengthening   Cybex Knee Extension  10lb 2 sets 10, SL 5 # 10 each    Cybex Knee Flexion  20# 2 sets 10 , SL 15# 10 each     Cybex Leg Press  40# 2 sets 15      Knee/Hip  Exercises: Standing   Lateral Step Up  Both;10 reps;Hand Hold: 1;Step Height: 6"    Forward Step Up  Both;1 set;10 reps;Hand Hold: 2;Step Height: 6"      Electrical Stimulation   Electrical Stimulation Location  BIL knee    Electrical Stimulation Action  premod    Electrical Stimulation Goals  Pain      Manual Therapy   Manual Therapy  Passive ROM;Joint mobilization    Joint Mobilization  flex and ext to increase ROM    Soft tissue mobilization  to quad to decrease tightness to increase ROM    Passive ROM  flex/ext               PT Short Term Goals - 01/23/18 1556      PT SHORT TERM GOAL #1   Title  assure independent with HEP    Time  2    Period  Weeks    Status  Achieved        PT Long Term Goals - 01/30/18 0907      PT LONG TERM GOAL #1  Title  increase AROM to 5-115 degrees flexion    Status  On-going      PT LONG TERM GOAL #2   Title  walk without deviation all community distances    Status  On-going      PT LONG TERM GOAL #3   Title  decrease pain 50%    Status  On-going      PT LONG TERM GOAL #4   Title  go up and down stairs step over step    Status  On-going      PT LONG TERM GOAL #5   Title  mow yard without difficulty    Status  On-going            Plan - 02/04/18 0847    Clinical Impression Statement  Pt tolerated interventions well. Pt stated he feels like "therapy has really been helping him this time around." Pt Pt required verbal cues to achieve full ROM on resisted LE strength exercises. Pt tolerated mobs and PROM well but guards at the onset of treatment.     Rehab Potential  Good    PT Frequency  2x / week    PT Duration  8 weeks    PT Treatment/Interventions  ADLs/Self Care Home Management;Cryotherapy;Electrical Stimulation;Gait training;Neuromuscular re-education;Balance training;Therapeutic exercise;Therapeutic activities;Functional mobility training;Stair training;Patient/family education;Manual techniques;Vasopneumatic  Device    PT Next Visit Plan  return to exercises to loosen up the knees and see if this helps his pain and function       Patient will benefit from skilled therapeutic intervention in order to improve the following deficits and impairments:  Abnormal gait, Decreased activity tolerance, Decreased balance, Decreased mobility, Decreased strength, Increased edema, Impaired flexibility, Pain, Cardiopulmonary status limiting activity, Decreased endurance, Decreased range of motion, Difficulty walking  Visit Diagnosis: Acute pain of left knee  Difficulty in walking, not elsewhere classified  Stiffness of left knee, not elsewhere classified  Localized edema  Acute pain of right knee  Stiffness of right knee, not elsewhere classified     Problem List Patient Active Problem List   Diagnosis Date Noted  . Primary osteoarthritis of right knee 06/07/2017  . Primary osteoarthritis of left knee 06/07/2017  . Effusion, left knee 06/07/2017    Margarita Mail, SPTA 02/04/2018, 8:51 AM  [facility] [address] [place], [place], [address] Phone: [phone]   Fax:  [phone]  Name: Peter Roth MRN: [id number] Date of Birth: [birth year]

## 2018-02-04 NOTE — Therapy (Signed)
[facility] [address] [place], [place], [address] Phone: [phone]   Fax:  [phone]  Patient Details  Name: Peter SchlatterChester K Roth MRN: [id number] Date of Birth: 1960/12/07 Referring Provider:  [facility]  Encounter Date: 02/04/2018   [facility] 02/04/2018, 9:33 AM  [facility] [address] [place], [place], [address] Phone: [phone]   Fax:  [phone]

## 2018-02-06 ENCOUNTER — Ambulatory Visit: Payer: Medicare Other | Attending: Orthopedic Surgery | Admitting: Physical Therapy

## 2018-02-06 DIAGNOSIS — M25661 Stiffness of right knee, not elsewhere classified: Secondary | ICD-10-CM | POA: Diagnosis present

## 2018-02-06 DIAGNOSIS — M25662 Stiffness of left knee, not elsewhere classified: Secondary | ICD-10-CM | POA: Diagnosis present

## 2018-02-06 DIAGNOSIS — M25562 Pain in left knee: Secondary | ICD-10-CM

## 2018-02-06 DIAGNOSIS — R600 Localized edema: Secondary | ICD-10-CM

## 2018-02-06 DIAGNOSIS — M25561 Pain in right knee: Secondary | ICD-10-CM | POA: Diagnosis present

## 2018-02-06 DIAGNOSIS — R262 Difficulty in walking, not elsewhere classified: Secondary | ICD-10-CM

## 2018-02-06 NOTE — Therapy (Signed)
[place], [place], [address] Phone: [phone]   Fax:  [phone]  Physical Therapy Treatment  Patient Details  Name: Peter Roth MRN: [id number] Date of Birth: 1961-02-15 Referring Provider: Berenice Primas   Encounter Date: 02/06/2018  PT End of Session - 02/06/18 0914    Visit Number  5    Date for PT Re-Evaluation  03/26/18    PT Start Time  0823    PT Stop Time  0928    PT Time Calculation (min)  65 min       Past Medical History:  Diagnosis Date  . Arthritis   . Bipolar 1 disorder ([place])   . Hyperlipidemia   . Hypertension   . Stress headaches   . Type 2 diabetes mellitus ([place])    followed by pcp  . Wears glasses     Past Surgical History:  Procedure Laterality Date  . KNEE ARTHROSCOPY Right ?date  . TOTAL KNEE ARTHROPLASTY Right 06/07/2017   Procedure: RIGHT TOTAL KNEE ARTHROPLASTY, INJECTION LEFT KNEE WITH FLUID ASPIRATION;  Surgeon: Dorna Leitz, MD;  [facility];  Service: Orthopedics;  Laterality: Right;  . TOTAL KNEE ARTHROPLASTY Left 08/23/2017   Procedure: LEFT TOTAL KNEE ARTHROPLASTY;  Surgeon: Dorna Leitz, MD;  [facility];  Service: Orthopedics;  Laterality: Left;    There were no vitals filed for this visit.  Subjective Assessment - 02/06/18 0825    Subjective  stiff, PT is helping    Currently in Pain?  Yes    Pain Score  3     Pain Location  Knee    Pain Orientation  Right;Left         [facility] PT Assessment - 02/06/18 0001      AROM   Right Knee Extension  3    Right Knee Flexion  94    Left Knee Extension  9    Left Knee Flexion  105                   [facility] Adult PT Treatment/Exercise - 02/06/18 0001      Knee/Hip Exercises: Aerobic   Tread Mill  TM push for ext 20 BIL    Recumbent Bike  6 min full rev    Nustep  L 4 6 min LE only      Knee/Hip Exercises: Standing   Heel Raises  Both;20 reps black bar    Forward Step Up   Both;15 reps;Hand Hold: 1;Step Height: 6"      Knee/Hip Exercises: Seated   Long Arc Quad  Strengthening;Both;2 sets;10 reps red tband hold 3 sec TKE    Other Seated Knee/Hip Exercises  fitter flex and ext 2 sets 10      Cryotherapy   Number Minutes Cryotherapy  15 Minutes    Cryotherapy Location  Knee    Type of Cryotherapy  Ice pack      Electrical Stimulation   Electrical Stimulation Location  BIL knee    Electrical Stimulation Action  premod    Electrical Stimulation Goals  Pain      Manual Therapy   Manual Therapy  Passive ROM;Joint mobilization    Joint Mobilization  flex and ext to increase ROM    Soft tissue mobilization  to quad to decrease tightness to increase ROM    Passive ROM  flex/ext               PT  Short Term Goals - 01/23/18 1556      PT SHORT TERM GOAL #1   Title  assure independent with HEP    Time  2    Period  Weeks    Status  Achieved        PT Long Term Goals - 02/06/18 0900      PT LONG TERM GOAL #1   Title  increase AROM to 5-115 degrees flexion    Status  Partially Met      PT LONG TERM GOAL #2   Title  walk without deviation all community distances    Status  Partially Met      PT LONG TERM GOAL #3   Title  decrease pain 50%    Status  On-going      PT LONG TERM GOAL #4   Title  go up and down stairs step over step    Status  On-going      PT LONG TERM GOAL #5   Title  mow yard without difficulty    Status  On-going            Plan - 02/06/18 0914    Clinical Impression Statement  slight increase in AROM, focus ex today on TKE and end range hold- cuing needed. decreased antalgic gait as ROM increases. progressing with all goals    PT Treatment/Interventions  ADLs/Self Care Home Management;Cryotherapy;Electrical Stimulation;Gait training;Neuromuscular re-education;Balance training;Therapeutic exercise;Therapeutic activities;Functional mobility training;Stair training;Patient/family education;Manual  techniques;Vasopneumatic Device    PT Next Visit Plan  end range ROM and strength       Patient will benefit from skilled therapeutic intervention in order to improve the following deficits and impairments:  Abnormal gait, Decreased activity tolerance, Decreased balance, Decreased mobility, Decreased strength, Increased edema, Impaired flexibility, Pain, Cardiopulmonary status limiting activity, Decreased endurance, Decreased range of motion, Difficulty walking  Visit Diagnosis: Acute pain of left knee  Difficulty in walking, not elsewhere classified  Stiffness of left knee, not elsewhere classified  Localized edema  Acute pain of right knee  Stiffness of right knee, not elsewhere classified     Problem List Patient Active Problem List   Diagnosis Date Noted  . Primary osteoarthritis of right knee 06/07/2017  . Primary osteoarthritis of left knee 06/07/2017  . Effusion, left knee 06/07/2017    Mahayla Haddaway,ANGIE PTA 02/06/2018, 9:16 AM  [address] [place], [place], [address] Phone: [phone]   Fax:  [phone]  Name: EPHRIAM TURMAN MRN: [id number] Date of Birth: 08/31/1960

## 2018-02-11 ENCOUNTER — Encounter: Payer: Self-pay | Admitting: Physical Therapy

## 2018-02-11 ENCOUNTER — Ambulatory Visit: Payer: Medicare Other | Admitting: Physical Therapy

## 2018-02-11 DIAGNOSIS — M25562 Pain in left knee: Secondary | ICD-10-CM

## 2018-02-11 DIAGNOSIS — M25662 Stiffness of left knee, not elsewhere classified: Secondary | ICD-10-CM

## 2018-02-11 DIAGNOSIS — M25561 Pain in right knee: Secondary | ICD-10-CM

## 2018-02-11 DIAGNOSIS — M25661 Stiffness of right knee, not elsewhere classified: Secondary | ICD-10-CM

## 2018-02-11 DIAGNOSIS — R262 Difficulty in walking, not elsewhere classified: Secondary | ICD-10-CM

## 2018-02-11 DIAGNOSIS — R600 Localized edema: Secondary | ICD-10-CM

## 2018-02-11 NOTE — Therapy (Signed)
[place], [place], [address] Phone: [phone]   Fax:  [phone]  Physical Therapy Treatment  Patient Details  Name: Peter Roth MRN: [id number] Date of Birth: December 30, 1960 Referring Provider: Berenice Primas   Encounter Date: 02/11/2018  PT End of Session - 02/11/18 1000    Visit Number  6    Date for PT Re-Evaluation  03/26/18    PT Start Time  0915    PT Stop Time  1015    PT Time Calculation (min)  60 min       Past Medical History:  Diagnosis Date  . Arthritis   . Bipolar 1 disorder ([place])   . Hyperlipidemia   . Hypertension   . Stress headaches   . Type 2 diabetes mellitus ([place])    followed by pcp  . Wears glasses     Past Surgical History:  Procedure Laterality Date  . KNEE ARTHROSCOPY Right ?date  . TOTAL KNEE ARTHROPLASTY Right 06/07/2017   Procedure: RIGHT TOTAL KNEE ARTHROPLASTY, INJECTION LEFT KNEE WITH FLUID ASPIRATION;  Surgeon: Dorna Leitz, MD;  [facility];  Service: Orthopedics;  Laterality: Right;  . TOTAL KNEE ARTHROPLASTY Left 08/23/2017   Procedure: LEFT TOTAL KNEE ARTHROPLASTY;  Surgeon: Dorna Leitz, MD;  [facility];  Service: Orthopedics;  Laterality: Left;    There were no vitals filed for this visit.  Subjective Assessment - 02/11/18 0922    Subjective  knees are doing okay    Currently in Pain?  Yes    Pain Score  2     Pain Location  Knee    Pain Orientation  Right;Left                       [facility] Adult PT Treatment/Exercise - 02/11/18 0001      Knee/Hip Exercises: Aerobic   Tread Mill  TM push for ext 20 BIL    Recumbent Bike  6 min full rev    Nustep  L 5 6 min LE only      Knee/Hip Exercises: Machines for Strengthening   Cybex Knee Extension  10lb 2 sets 10, SL 5 # 10 each    Cybex Knee Flexion  20# 2 sets 10 , SL 15# 10 each       Knee/Hip Exercises: Standing   Heel Raises  Both;20 reps heel raise and toe raise-  black bar    Other Standing Knee Exercises  cable press down 50# 20 times each worked on ROM and Occupational hygienist Location  BIL knee    Electrical Stimulation Action  premod    Electrical Stimulation Goals  Pain               PT Short Term Goals - 01/23/18 1556      PT SHORT TERM GOAL #1   Title  assure independent with HEP    Time  2    Period  Weeks    Status  Achieved        PT Long Term Goals - 02/06/18 0900      PT LONG TERM GOAL #1   Title  increase AROM to 5-115 degrees flexion    Status  Partially Met      PT LONG TERM GOAL #2   Title  walk without deviation all community distances    Status  Partially Met  PT LONG TERM GOAL #3   Title  decrease pain 50%    Status  On-going      PT LONG TERM GOAL #4   Title  go up and down stairs step over step    Status  On-going      PT LONG TERM GOAL #5   Title  mow yard without difficulty    Status  On-going            Plan - 02/11/18 1000    Clinical Impression Statement  focus session on full control ROM with each ex- cuing needed    PT Treatment/Interventions  ADLs/Self Care Home Management;Cryotherapy;Electrical Stimulation;Gait training;Neuromuscular re-education;Balance training;Therapeutic exercise;Therapeutic activities;Functional mobility training;Stair training;Patient/family education;Manual techniques;Vasopneumatic Device    PT Next Visit Plan  end range ROM and strength       Patient will benefit from skilled therapeutic intervention in order to improve the following deficits and impairments:  Abnormal gait, Decreased activity tolerance, Decreased balance, Decreased mobility, Decreased strength, Increased edema, Impaired flexibility, Pain, Cardiopulmonary status limiting activity, Decreased endurance, Decreased range of motion, Difficulty walking  Visit Diagnosis: Acute pain of left knee  Difficulty in walking, not elsewhere  classified  Stiffness of left knee, not elsewhere classified  Acute pain of right knee  Stiffness of right knee, not elsewhere classified  Localized edema     Problem List Patient Active Problem List   Diagnosis Date Noted  . Primary osteoarthritis of right knee 06/07/2017  . Primary osteoarthritis of left knee 06/07/2017  . Effusion, left knee 06/07/2017    PAYSEUR,ANGIE PTA 02/11/2018, 10:01 AM  [place] [address] [place], [place], [address] Phone: [phone]   Fax:  [phone]  Name: Peter Roth MRN: [id number] Date of Birth: 06-19-1961

## 2018-02-13 ENCOUNTER — Ambulatory Visit: Payer: Medicare Other | Admitting: Physical Therapy

## 2018-02-13 DIAGNOSIS — M25561 Pain in right knee: Secondary | ICD-10-CM

## 2018-02-13 DIAGNOSIS — M25662 Stiffness of left knee, not elsewhere classified: Secondary | ICD-10-CM

## 2018-02-13 DIAGNOSIS — M25562 Pain in left knee: Secondary | ICD-10-CM

## 2018-02-13 DIAGNOSIS — M25661 Stiffness of right knee, not elsewhere classified: Secondary | ICD-10-CM

## 2018-02-13 DIAGNOSIS — R262 Difficulty in walking, not elsewhere classified: Secondary | ICD-10-CM

## 2018-02-13 DIAGNOSIS — R600 Localized edema: Secondary | ICD-10-CM

## 2018-02-13 NOTE — Therapy (Signed)
[address] [place], [place], [address] Phone: [phone]   Fax:  [phone]  Physical Therapy Treatment  Patient Details  Name: Peter Roth MRN: [id number] Date of Birth: [birth year] Referring Provider: Berenice Primas   Encounter Date: 02/13/2018  PT End of Session - 02/13/18 1005    Visit Number  7    Date for PT Re-Evaluation  03/26/18    PT Start Time  0930    PT Stop Time  1030    PT Time Calculation (min)  60 min       Past Medical History:  Diagnosis Date  . Arthritis   . Bipolar 1 disorder ([place])   . Hyperlipidemia   . Hypertension   . Stress headaches   . Type 2 diabetes mellitus ([place])    followed by pcp  . Wears glasses     Past Surgical History:  Procedure Laterality Date  . KNEE ARTHROSCOPY Right ?date  . TOTAL KNEE ARTHROPLASTY Right 06/07/2017   Procedure: RIGHT TOTAL KNEE ARTHROPLASTY, INJECTION LEFT KNEE WITH FLUID ASPIRATION;  Surgeon: Dorna Leitz, MD;  Location: [facility];  Service: Orthopedics;  Laterality: Right;  . TOTAL KNEE ARTHROPLASTY Left 08/23/2017   Procedure: LEFT TOTAL KNEE ARTHROPLASTY;  Surgeon: Dorna Leitz, MD;  Location: [facility];  Service: Orthopedics;  Laterality: Left;    There were no vitals filed for this visit.  Subjective Assessment - 02/13/18 0932    Subjective  therapy is really helping knees    Currently in Pain?  Yes    Pain Score  2     Pain Location  Knee    Pain Orientation  Right;Left         [facility] PT Assessment - 02/13/18 0001      AROM   Right Knee Extension  0    Right Knee Flexion  111    Left Knee Extension  3    Left Knee Flexion  113                   [facility] Adult PT Treatment/Exercise - 02/13/18 0001      Knee/Hip Exercises: Aerobic   Recumbent Bike  6 min full rev    Nustep  L 5 7 min      Knee/Hip Exercises: Machines for Strengthening   Cybex Knee Extension  10lb 2 sets 10 hold TKE 3 sec, SL 10 # 8 each    Cybex Knee Flexion  25# 3 sets 10    Cybex Leg Press  40# 2 sets 15      Knee/Hip Exercises: Standing   Lateral Step Up  Both;15 reps;Hand Hold: 1;Step Height: 6"    Forward Step Up  Both;15 reps;Hand Hold: 1;Step Height: 6"    Other Standing Knee Exercises  cable press down 50# 20 times each worked on ROM and control      Knee/Hip Exercises: Seated   Long Arc Quad  Strengthening;Both;2 sets;10 reps    Other Seated Knee/Hip Exercises  fitter flex and ext 2 sets 10      Cryotherapy   Number Minutes Cryotherapy  15 Minutes    Cryotherapy Location  Knee    Type of Cryotherapy  Ice pack      Electrical Stimulation   Electrical Stimulation Location  BIL knee    Electrical Stimulation Action  premod    Electrical Stimulation Goals  Pain  PT Long Term Goals - 02/13/18 1005      PT LONG TERM GOAL #1   Title  increase AROM to 5-115 degrees flexion    Status  Partially Met      PT LONG TERM GOAL #2   Title  walk without deviation all community distances    Status  Partially Met      PT LONG TERM GOAL #3   Title  decrease pain 50%    Status  Partially Met      PT LONG TERM GOAL #4   Title  go up and down stairs step over step    Status  Partially Met      PT LONG TERM GOAL #5   Title  mow yard without difficulty    Status  Partially Met            Plan - 02/13/18 1005    Clinical Impression Statement  excellent increase in ROM and func, still cuing for full ROM and muscles isolation.    PT Treatment/Interventions  ADLs/Self Care Home Management;Cryotherapy;Electrical Stimulation;Gait training;Neuromuscular re-education;Balance training;Therapeutic exercise;Therapeutic activities;Functional mobility training;Stair training;Patient/family education;Manual techniques;Vasopneumatic Device    PT Next Visit Plan  end range ROM and strength       Patient will benefit from skilled therapeutic intervention in order to improve the following deficits  and impairments:  Abnormal gait, Decreased activity tolerance, Decreased balance, Decreased mobility, Decreased strength, Increased edema, Impaired flexibility, Pain, Cardiopulmonary status limiting activity, Decreased endurance, Decreased range of motion, Difficulty walking  Visit Diagnosis: Acute pain of left knee  Difficulty in walking, not elsewhere classified  Stiffness of left knee, not elsewhere classified  Acute pain of right knee  Stiffness of right knee, not elsewhere classified  Localized edema     Problem List Patient Active Problem List   Diagnosis Date Noted  . Primary osteoarthritis of right knee 06/07/2017  . Primary osteoarthritis of left knee 06/07/2017  . Effusion, left knee 06/07/2017    PAYSEUR,ANGIE PTA 02/13/2018, 10:07 AM  [address] [place], [place], [address] Phone: [phone]   Fax:  [phone]  Name: Peter Roth MRN: [id number] Date of Birth: 1960/10/06

## 2018-02-18 ENCOUNTER — Encounter: Payer: Self-pay | Admitting: Physical Therapy

## 2018-02-18 ENCOUNTER — Ambulatory Visit: Payer: Medicare Other | Admitting: Physical Therapy

## 2018-02-18 DIAGNOSIS — M25661 Stiffness of right knee, not elsewhere classified: Secondary | ICD-10-CM

## 2018-02-18 DIAGNOSIS — R262 Difficulty in walking, not elsewhere classified: Secondary | ICD-10-CM

## 2018-02-18 DIAGNOSIS — M25561 Pain in right knee: Secondary | ICD-10-CM

## 2018-02-18 DIAGNOSIS — M25662 Stiffness of left knee, not elsewhere classified: Secondary | ICD-10-CM

## 2018-02-18 DIAGNOSIS — M25562 Pain in left knee: Secondary | ICD-10-CM | POA: Diagnosis not present

## 2018-02-18 DIAGNOSIS — R600 Localized edema: Secondary | ICD-10-CM

## 2018-02-18 NOTE — Therapy (Signed)
[address] [place], [place], [address] Phone: [phone]   Fax:  [phone]  Physical Therapy Treatment  Patient Details  Name: Peter Roth MRN: [id number] Date of Birth: 12-17-1960 Referring Provider: Berenice Primas   Encounter Date: 02/18/2018  PT End of Session - 02/18/18 0912    Visit Number  8    Date for PT Re-Evaluation  03/26/18    PT Start Time  0840    PT Stop Time  0940    PT Time Calculation (min)  60 min       Past Medical History:  Diagnosis Date  . Arthritis   . Bipolar 1 disorder ([place])   . Hyperlipidemia   . Hypertension   . Stress headaches   . Type 2 diabetes mellitus ([place])    followed by pcp  . Wears glasses     Past Surgical History:  Procedure Laterality Date  . KNEE ARTHROSCOPY Right ?date  . TOTAL KNEE ARTHROPLASTY Right 06/07/2017   Procedure: RIGHT TOTAL KNEE ARTHROPLASTY, INJECTION LEFT KNEE WITH FLUID ASPIRATION;  Surgeon: Dorna Leitz, MD;  [facility];  Service: Orthopedics;  Laterality: Right;  . TOTAL KNEE ARTHROPLASTY Left 08/23/2017   Procedure: LEFT TOTAL KNEE ARTHROPLASTY;  Surgeon: Dorna Leitz, MD;  [facility];  Service: Orthopedics;  Laterality: Left;    There were no vitals filed for this visit.  Subjective Assessment - 02/18/18 0841    Subjective  still stiff but pain better    Currently in Pain?  Yes    Pain Score  2     Pain Location  Knee    Pain Orientation  Right;Left                       [facility] Adult PT Treatment/Exercise - 02/18/18 0001      Knee/Hip Exercises: Aerobic   Elliptical  3 fwd/3 back I 10 R 5    Tread Mill  TM push for ext 20 BIL    Nustep  L 5 8 min   LE only     Knee/Hip Exercises: Machines for Strengthening   Cybex Knee Extension  10lb 2 sets 10 hold TKE 3 sec,    10#SL 10 each   Cybex Knee Flexion  25# 3 sets 10    Cybex Leg Press  50# 2 sets 15   calf raises 50# 2 sets 15     Knee/Hip  Exercises: Standing   Other Standing Knee Exercises  cable press down 50# 20 times each worked on ROM and control    Other Standing Knee Exercises  TKE red tband 2 sets 15      Cryotherapy   Number Minutes Cryotherapy  15 Minutes    Cryotherapy Location  Knee    Type of Cryotherapy  Ice pack      Electrical Stimulation   Electrical Stimulation Location  BIL knee    Electrical Stimulation Action  premod    Electrical Stimulation Goals  Pain               PT Short Term Goals - 01/23/18 1556      PT SHORT TERM GOAL #1   Title  assure independent with HEP    Time  2    Period  Weeks    Status  Achieved        PT Long Term Goals - 02/13/18 1005  PT LONG TERM GOAL #1   Title  increase AROM to 5-115 degrees flexion    Status  Partially Met      PT LONG TERM GOAL #2   Title  walk without deviation all community distances    Status  Partially Met      PT LONG TERM GOAL #3   Title  decrease pain 50%    Status  Partially Met      PT LONG TERM GOAL #4   Title  go up and down stairs step over step    Status  Partially Met      PT LONG TERM GOAL #5   Title  mow yard without difficulty    Status  Partially Met            Plan - 02/18/18 0912    Clinical Impression Statement  pt needing less cuing with ther ex and achieving increased ROM esp ext. pt did fatigue quickly with addition of hip strengthening. pt responding well to tx and continues to get relief with estim and ice    PT Treatment/Interventions  ADLs/Self Care Home Management;Cryotherapy;Electrical Stimulation;Gait training;Neuromuscular re-education;Balance training;Therapeutic exercise;Therapeutic activities;Functional mobility training;Stair training;Patient/family education;Manual techniques;Vasopneumatic Device    PT Next Visit Plan  end range ROM and strengthening of hips       Patient will benefit from skilled therapeutic intervention in order to improve the following deficits and  impairments:  Abnormal gait, Decreased activity tolerance, Decreased balance, Decreased mobility, Decreased strength, Increased edema, Impaired flexibility, Pain, Cardiopulmonary status limiting activity, Decreased endurance, Decreased range of motion, Difficulty walking  Visit Diagnosis: Acute pain of left knee  Difficulty in walking, not elsewhere classified  Stiffness of left knee, not elsewhere classified  Acute pain of right knee  Stiffness of right knee, not elsewhere classified  Localized edema     Problem List Patient Active Problem List   Diagnosis Date Noted  . Primary osteoarthritis of right knee 06/07/2017  . Primary osteoarthritis of left knee 06/07/2017  . Effusion, left knee 06/07/2017    Vearl Allbaugh,ANGIE PTA 02/18/2018, 9:14 AM  [address] [place], [place], [address] Phone: [phone]   Fax:  [phone]  Name: Peter Roth MRN: [id number] Date of Birth: 02/08/1961

## 2018-02-20 ENCOUNTER — Ambulatory Visit: Payer: Medicare Other | Admitting: Physical Therapy

## 2018-02-20 ENCOUNTER — Encounter: Payer: Self-pay | Admitting: Physical Therapy

## 2018-02-20 DIAGNOSIS — M25662 Stiffness of left knee, not elsewhere classified: Secondary | ICD-10-CM

## 2018-02-20 DIAGNOSIS — M25561 Pain in right knee: Secondary | ICD-10-CM

## 2018-02-20 DIAGNOSIS — M25562 Pain in left knee: Secondary | ICD-10-CM | POA: Diagnosis not present

## 2018-02-20 DIAGNOSIS — R600 Localized edema: Secondary | ICD-10-CM

## 2018-02-20 DIAGNOSIS — M25661 Stiffness of right knee, not elsewhere classified: Secondary | ICD-10-CM

## 2018-02-20 DIAGNOSIS — R262 Difficulty in walking, not elsewhere classified: Secondary | ICD-10-CM

## 2018-02-20 NOTE — Therapy (Signed)
[address] [place], [place], [address] Phone: [phone]   Fax:  [phone]  Physical Therapy Treatment  Patient Details  Name: Peter Roth MRN: [id number] Date of Birth: 04/27/1961 Referring Provider: Berenice Primas   Encounter Date: 02/20/2018  PT End of Session - 02/20/18 0927    Visit Number  9    Date for PT Re-Evaluation  03/26/18    PT Start Time  0845    PT Stop Time  0945    PT Time Calculation (min)  60 min       Past Medical History:  Diagnosis Date  . Arthritis   . Bipolar 1 disorder ([place])   . Hyperlipidemia   . Hypertension   . Stress headaches   . Type 2 diabetes mellitus ([place])    followed by pcp  . Wears glasses     Past Surgical History:  Procedure Laterality Date  . KNEE ARTHROSCOPY Right ?date  . TOTAL KNEE ARTHROPLASTY Right 06/07/2017   Procedure: RIGHT TOTAL KNEE ARTHROPLASTY, INJECTION LEFT KNEE WITH FLUID ASPIRATION;  Surgeon: Dorna Leitz, MD;  [facility];  Service: Orthopedics;  Laterality: Right;  . TOTAL KNEE ARTHROPLASTY Left 08/23/2017   Procedure: LEFT TOTAL KNEE ARTHROPLASTY;  Surgeon: Dorna Leitz, MD;  [facility];  Service: Orthopedics;  Laterality: Left;    There were no vitals filed for this visit.  Subjective Assessment - 02/20/18 0847    Subjective  saw MD yesterday, he gave me sleeves to wear on knees and it helped. he said continue PT. 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 Stiff and Sore    Currently in Pain?  Yes    Pain Score  2     Pain Location  Knee    Pain Orientation  Right;Left         [facility] PT Assessment - 02/20/18 0001      AROM   Right Knee Extension  0    Right Knee Flexion  110    Left Knee Extension  2    Left Knee Flexion  115                   [facility] Adult PT Treatment/Exercise - 02/20/18 0001      Knee/Hip Exercises: Aerobic   Elliptical  3 fwd/3 back I 10 R 5    Tread Mill  TM push for  ext 20 BIL    Nustep  L 5 8 min   LE only     Knee/Hip Exercises: Machines for Strengthening   Cybex Knee Extension  10lb 2 sets 10 hold TKE 3 sec,    SL 10 each btwn above sets   Cybex Knee Flexion  25# 3 sets 10    Cybex Leg Press  50# 2 sets 10, 30# SL 10 each   50# calf raises 2 sets 15     Knee/Hip Exercises: Standing   Other Standing Knee Exercises  squat down to knees on foam cushion with UE support 2 sets 5   needs UE for getting up/down     Knee/Hip Exercises: Seated   Sit to Sand  3 sets;5 reps;without UE support   from 12 inches to increase knee flexion for RTW activities     Cryotherapy   Number Minutes Cryotherapy  15 Minutes    Cryotherapy Location  Knee    Type of Cryotherapy  Ice pack  Acupuncturist Location  BIL knee    Electrical Stimulation Action  premod    Electrical Stimulation Goals  Pain               PT Short Term Goals - 01/23/18 1556      PT SHORT TERM GOAL #1   Title  assure independent with HEP    Time  2    Period  Weeks    Status  Achieved        PT Long Term Goals - 02/20/18 4128      PT LONG TERM GOAL #1   Title  increase AROM to 5-115 degrees flexion    Status  Partially Met      PT LONG TERM GOAL #2   Title  walk without deviation all community distances    Status  Partially Met      PT LONG TERM GOAL #3   Title  decrease pain 50%    Status  Partially Met      PT LONG TERM GOAL #4   Title  go up and down stairs step over step    Baseline  achieved but painful at times    Status  Achieved      PT LONG TERM GOAL #5   Title  mow yard without difficulty    Status  Achieved            Plan - 02/20/18 0928    Clinical Impression Statement  added ex to increase flexion with activity ie STS 12 inches needed UE for eccentric lowering, kneeling on foam and needed UE for up and down with pain plus lowered leg press. Pt with decreased strength and some pian in increased loaded  flexion. progressing with goals.    PT Treatment/Interventions  ADLs/Self Care Home Management;Cryotherapy;Electrical Stimulation;Gait training;Neuromuscular re-education;Balance training;Therapeutic exercise;Therapeutic activities;Functional mobility training;Stair training;Patient/family education;Manual techniques;Vasopneumatic Device    PT Next Visit Plan  end range ROM and strengthening of hips       Patient will benefit from skilled therapeutic intervention in order to improve the following deficits and impairments:  Abnormal gait, Decreased activity tolerance, Decreased balance, Decreased mobility, Decreased strength, Increased edema, Impaired flexibility, Pain, Cardiopulmonary status limiting activity, Decreased endurance, Decreased range of motion, Difficulty walking  Visit Diagnosis: Acute pain of left knee  Difficulty in walking, not elsewhere classified  Stiffness of left knee, not elsewhere classified  Acute pain of right knee  Stiffness of right knee, not elsewhere classified  Localized edema     Problem List Patient Active Problem List   Diagnosis Date Noted  . Primary osteoarthritis of right knee 06/07/2017  . Primary osteoarthritis of left knee 06/07/2017  . Effusion, left knee 06/07/2017    PAYSEUR,ANGIE PTA 02/20/2018, 9:31 AM  [address] [place], [place], [address] Phone: [phone]   Fax:  [phone]  Name: Peter Roth MRN: [id number] Date of Birth: 1960-07-27

## 2018-02-25 ENCOUNTER — Encounter: Payer: Self-pay | Admitting: Physical Therapy

## 2018-02-25 ENCOUNTER — Ambulatory Visit: Payer: Medicare Other | Admitting: Physical Therapy

## 2018-02-25 DIAGNOSIS — M25662 Stiffness of left knee, not elsewhere classified: Secondary | ICD-10-CM

## 2018-02-25 DIAGNOSIS — R600 Localized edema: Secondary | ICD-10-CM

## 2018-02-25 DIAGNOSIS — R262 Difficulty in walking, not elsewhere classified: Secondary | ICD-10-CM

## 2018-02-25 DIAGNOSIS — M25661 Stiffness of right knee, not elsewhere classified: Secondary | ICD-10-CM

## 2018-02-25 DIAGNOSIS — M25562 Pain in left knee: Secondary | ICD-10-CM | POA: Diagnosis not present

## 2018-02-25 DIAGNOSIS — M25561 Pain in right knee: Secondary | ICD-10-CM

## 2018-02-25 NOTE — Therapy (Signed)
[address] [place], [place], [address] Phone: [phone]   Fax:  [phone] Progress Note Reporting Period 01/23/2018 to 02/25/18 for the first 10 visits  See note below for Objective Data and Assessment of Progress/Goals.      Physical Therapy Treatment  Patient Details  Name: Peter Roth MRN: [id number] Date of Birth: 20-Jul-1960 Referring Provider: Berenice Primas   Encounter Date: 02/25/2018  PT End of Session - 02/25/18 1038    Visit Number  10    Date for PT Re-Evaluation  03/26/18    PT Start Time  1011    PT Stop Time  1110    PT Time Calculation (min)  59 min       Past Medical History:  Diagnosis Date  . Arthritis   . Bipolar 1 disorder ([place])   . Hyperlipidemia   . Hypertension   . Stress headaches   . Type 2 diabetes mellitus ([place])    followed by pcp  . Wears glasses     Past Surgical History:  Procedure Laterality Date  . KNEE ARTHROSCOPY Right ?date  . TOTAL KNEE ARTHROPLASTY Right 06/07/2017   Procedure: RIGHT TOTAL KNEE ARTHROPLASTY, INJECTION LEFT KNEE WITH FLUID ASPIRATION;  Surgeon: Dorna Leitz, MD;  [facility];  Service: Orthopedics;  Laterality: Right;  . TOTAL KNEE ARTHROPLASTY Left 08/23/2017   Procedure: LEFT TOTAL KNEE ARTHROPLASTY;  Surgeon: Dorna Leitz, MD;  [facility];  Service: Orthopedics;  Laterality: Left;    There were no vitals filed for this visit.  Subjective Assessment - 02/25/18 1011    Subjective  staying stiff but pan is better    Currently in Pain?  Yes    Pain Score  2     Pain Location  Knee    Pain Orientation  Right;Left                       [facility] Adult PT Treatment/Exercise - 02/25/18 0001      Exercises   Exercises  Knee/Hip      Knee/Hip Exercises: Aerobic   Elliptical  3 fwd/3 back I 10 R 5    Tread Mill  TM push for ext 20 BIL    Nustep  L 5 8 min   LE only     Knee/Hip Exercises: Machines for  Strengthening   Cybex Knee Extension  10lb 2 sets 10 hold TKE 3 sec,     Cybex Knee Flexion  25# 3 sets 10    Cybex Leg Press  50# 2 sets 10, 30# SL 10 each      Knee/Hip Exercises: Standing   Heel Raises  Both;20 reps   black bar   Functional Squat  15 reps   on BOSU   Other Standing Knee Exercises  BIL hip pulley 4 way 12 each 10 #      Cryotherapy   Number Minutes Cryotherapy  15 Minutes    Cryotherapy Location  Knee    Type of Cryotherapy  Ice pack      Electrical Stimulation   Electrical Stimulation Location  BIL knee    Electrical Stimulation Action  premod    Electrical Stimulation Goals  Pain               PT Short Term Goals - 01/23/18 1556      PT SHORT TERM GOAL #1   Title  assure independent with HEP  Time  2    Period  Weeks    Status  Achieved        PT Long Term Goals - 02/20/18 4715      PT LONG TERM GOAL #1   Title  increase AROM to 5-115 degrees flexion    Status  Partially Met      PT LONG TERM GOAL #2   Title  walk without deviation all community distances    Status  Partially Met      PT LONG TERM GOAL #3   Title  decrease pain 50%    Status  Partially Met      PT LONG TERM GOAL #4   Title  go up and down stairs step over step    Baseline  achieved but painful at times    Status  Achieved      PT LONG TERM GOAL #5   Title  mow yard without difficulty    Status  Achieved            Plan - 02/25/18 1039    Clinical Impression Statement  progressing with all goals. chief complaint is stiffness but overall decreased pain and mobility. hip weakness noted with cable pulleys    PT Treatment/Interventions  ADLs/Self Care Home Management;Cryotherapy;Electrical Stimulation;Gait training;Neuromuscular re-education;Balance training;Therapeutic exercise;Therapeutic activities;Functional mobility training;Stair training;Patient/family education;Manual techniques;Vasopneumatic Device    PT Next Visit Plan  end range ROM and  strengthening of hips       Patient will benefit from skilled therapeutic intervention in order to improve the following deficits and impairments:  Abnormal gait, Decreased activity tolerance, Decreased balance, Decreased mobility, Decreased strength, Increased edema, Impaired flexibility, Pain, Cardiopulmonary status limiting activity, Decreased endurance, Decreased range of motion, Difficulty walking  Visit Diagnosis: Acute pain of left knee  Difficulty in walking, not elsewhere classified  Stiffness of left knee, not elsewhere classified  Acute pain of right knee  Stiffness of right knee, not elsewhere classified  Localized edema     Problem List Patient Active Problem List   Diagnosis Date Noted  . Primary osteoarthritis of right knee 06/07/2017  . Primary osteoarthritis of left knee 06/07/2017  . Effusion, left knee 06/07/2017    Peter Roth,ANGIE PTA 02/25/2018, 10:42 AM  [address] [place], [place], [address] Phone: [phone]   Fax:  [phone]  Name: Peter Roth MRN: [id number] Date of Birth: [birth year]

## 2018-02-27 ENCOUNTER — Encounter: Payer: Self-pay | Admitting: Physical Therapy

## 2018-02-27 ENCOUNTER — Ambulatory Visit: Payer: Medicare Other | Admitting: Physical Therapy

## 2018-02-27 DIAGNOSIS — M25561 Pain in right knee: Secondary | ICD-10-CM

## 2018-02-27 DIAGNOSIS — M25562 Pain in left knee: Secondary | ICD-10-CM | POA: Diagnosis not present

## 2018-02-27 DIAGNOSIS — M25661 Stiffness of right knee, not elsewhere classified: Secondary | ICD-10-CM

## 2018-02-27 DIAGNOSIS — R262 Difficulty in walking, not elsewhere classified: Secondary | ICD-10-CM

## 2018-02-27 DIAGNOSIS — R600 Localized edema: Secondary | ICD-10-CM

## 2018-02-27 DIAGNOSIS — M25662 Stiffness of left knee, not elsewhere classified: Secondary | ICD-10-CM

## 2018-02-27 NOTE — Therapy (Signed)
[address] [place], [place], [address] Phone: [phone]   Fax:  [phone]  Physical Therapy Treatment  Patient Details  Name: Peter Roth MRN: [id number] Date of Birth: 10-May-1961 Referring Provider: Berenice Primas   Encounter Date: 02/27/2018  PT End of Session - 02/27/18 0956    Visit Number  11    Date for PT Re-Evaluation  03/26/18    PT Start Time  0938    PT Stop Time  1035    PT Time Calculation (min)  57 min       Past Medical History:  Diagnosis Date  . Arthritis   . Bipolar 1 disorder ([place])   . Hyperlipidemia   . Hypertension   . Stress headaches   . Type 2 diabetes mellitus ([place])    followed by pcp  . Wears glasses     Past Surgical History:  Procedure Laterality Date  . KNEE ARTHROSCOPY Right ?date  . TOTAL KNEE ARTHROPLASTY Right 06/07/2017   Procedure: RIGHT TOTAL KNEE ARTHROPLASTY, INJECTION LEFT KNEE WITH FLUID ASPIRATION;  Surgeon: Dorna Leitz, MD;  Location: [facility];  Service: Orthopedics;  Laterality: Right;  . TOTAL KNEE ARTHROPLASTY Left 08/23/2017   Procedure: LEFT TOTAL KNEE ARTHROPLASTY;  Surgeon: Dorna Leitz, MD;  Location: [facility];  Service: Orthopedics;  Laterality: Left;    There were no vitals filed for this visit.  Subjective Assessment - 02/27/18 0939    Subjective  hurt bad yesterday with rain, stiffness is biggest issue    Currently in Pain?  Yes    Pain Score  2     Pain Location  Knee    Pain Orientation  Right;Left         [facility] PT Assessment - 02/27/18 0001      AROM   Right Knee Flexion  110    Left Knee Flexion  115                   [facility] Adult PT Treatment/Exercise - 02/27/18 0001      Exercises   Exercises  Knee/Hip      Knee/Hip Exercises: Aerobic   Tread Mill  TM push for ext 20 BIL    Recumbent Bike   80mn full rev   stiffness noted   Nustep  L 5 6 min   LE only     Knee/Hip Exercises: Machines for  Strengthening   Cybex Knee Extension  10lb 2 sets 10 hold TKE 3 sec,     Cybex Knee Flexion  25# 3 sets 10    Cybex Leg Press  50# 2 sets 10, 30# SL 10 each   worked to get deeper into flexion     Knee/Hip Exercises: Standing   Lateral Step Up  Both;15 reps;Hand Hold: 1;Step Height: 6"    Forward Step Up  Both;15 reps;Hand Hold: 1;Step Height: 6"    Functional Squat  15 reps    Wall Squat  10 reps;3 seconds    Other Standing Knee Exercises  BIL hip pulley 4 way 12 each 10 #      Cryotherapy   Number Minutes Cryotherapy  15 Minutes    Cryotherapy Location  Knee    Type of Cryotherapy  Ice pack      Electrical Stimulation   Electrical Stimulation Location  BIL knee    Electrical Stimulation Action  premod    Electrical Stimulation Goals  Pain  PT Short Term Goals - 01/23/18 1556      PT SHORT TERM GOAL #1   Title  assure independent with HEP    Time  2    Period  Weeks    Status  Achieved        PT Long Term Goals - 02/27/18 8466      PT LONG TERM GOAL #1   Title  increase AROM to 5-115 degrees flexion    Baseline  ROM remaining consistant this week. stiffness noted with bike and func flexion    Status  Partially Met      PT LONG TERM GOAL #2   Title  walk without deviation all community distances    Baseline  as pt fatigues deficiets increase    Status  Partially Met      PT LONG TERM GOAL #3   Title  decrease pain 50%    Status  Achieved      PT LONG TERM GOAL #4   Title  go up and down stairs step over step    Status  Achieved      PT LONG TERM GOAL #5   Title  mow yard without difficulty    Status  Achieved            Plan - 02/27/18 0956    Clinical Impression Statement  progressing towards all goals. pt resuming all activities with less pain but c/o stiffness and stiffness noted at end range and with func activities. cuing note to compensate when squating    PT Treatment/Interventions  ADLs/Self Care Home  Management;Cryotherapy;Electrical Stimulation;Gait training;Neuromuscular re-education;Balance training;Therapeutic exercise;Therapeutic activities;Functional mobility training;Stair training;Patient/family education;Manual techniques;Vasopneumatic Device    PT Next Visit Plan  end range ROM and strengthening of hips       Patient will benefit from skilled therapeutic intervention in order to improve the following deficits and impairments:  Abnormal gait, Decreased activity tolerance, Decreased balance, Decreased mobility, Decreased strength, Increased edema, Impaired flexibility, Pain, Cardiopulmonary status limiting activity, Decreased endurance, Decreased range of motion, Difficulty walking  Visit Diagnosis: Acute pain of left knee  Difficulty in walking, not elsewhere classified  Stiffness of left knee, not elsewhere classified  Acute pain of right knee  Stiffness of right knee, not elsewhere classified  Localized edema     Problem List Patient Active Problem List   Diagnosis Date Noted  . Primary osteoarthritis of right knee 06/07/2017  . Primary osteoarthritis of left knee 06/07/2017  . Effusion, left knee 06/07/2017    Lizabeth Fellner,ANGIE PTA 02/27/2018, 10:00 AM  [address] [place], [place], [address] Phone: [phone]   Fax:  [phone]  Name: Peter Roth MRN: [id number] Date of Birth: 26-Jan-1961

## 2018-03-04 ENCOUNTER — Ambulatory Visit: Payer: Medicare Other | Admitting: Physical Therapy

## 2018-03-06 ENCOUNTER — Encounter: Payer: Self-pay | Admitting: Physical Therapy

## 2018-03-06 ENCOUNTER — Ambulatory Visit: Payer: Medicare Other | Admitting: Physical Therapy

## 2018-03-06 DIAGNOSIS — M25661 Stiffness of right knee, not elsewhere classified: Secondary | ICD-10-CM

## 2018-03-06 DIAGNOSIS — M25561 Pain in right knee: Secondary | ICD-10-CM

## 2018-03-06 DIAGNOSIS — M25662 Stiffness of left knee, not elsewhere classified: Secondary | ICD-10-CM

## 2018-03-06 DIAGNOSIS — R262 Difficulty in walking, not elsewhere classified: Secondary | ICD-10-CM

## 2018-03-06 DIAGNOSIS — M25562 Pain in left knee: Secondary | ICD-10-CM | POA: Diagnosis not present

## 2018-03-06 DIAGNOSIS — R600 Localized edema: Secondary | ICD-10-CM

## 2018-03-06 NOTE — Therapy (Signed)
[address] [place], [place], [address] Phone: [phone]   Fax:  [phone]  Physical Therapy Treatment  Patient Details  Name: Peter Roth MRN: [id number] Date of Birth: 04-15-1961 Referring Provider: Berenice Primas   Encounter Date: 03/06/2018  PT End of Session - 03/06/18 0816    Visit Number  12    Date for PT Re-Evaluation  03/26/18    PT Start Time  0810    PT Stop Time  0905    PT Time Calculation (min)  55 min       Past Medical History:  Diagnosis Date  . Arthritis   . Bipolar 1 disorder ([place])   . Hyperlipidemia   . Hypertension   . Stress headaches   . Type 2 diabetes mellitus ([place])    followed by pcp  . Wears glasses     Past Surgical History:  Procedure Laterality Date  . KNEE ARTHROSCOPY Right ?date  . TOTAL KNEE ARTHROPLASTY Right 06/07/2017   Procedure: RIGHT TOTAL KNEE ARTHROPLASTY, INJECTION LEFT KNEE WITH FLUID ASPIRATION;  Surgeon: Dorna Leitz, MD;  [facility];  Service: Orthopedics;  Laterality: Right;  . TOTAL KNEE ARTHROPLASTY Left 08/23/2017   Procedure: LEFT TOTAL KNEE ARTHROPLASTY;  Surgeon: Dorna Leitz, MD;  [facility];  Service: Orthopedics;  Laterality: Left;    There were no vitals filed for this visit.  Subjective Assessment - 03/06/18 0811    Subjective  physically helping GF so knees are sore    Currently in Pain?  Yes    Pain Score  3     Pain Location  Knee    Pain Orientation  Right;Left         [facility] PT Assessment - 03/06/18 0001      AROM   Right Knee Flexion  109    Left Knee Flexion  113                   [facility] Adult PT Treatment/Exercise - 03/06/18 0001      Knee/Hip Exercises: Aerobic   Recumbent Bike   67mn full rev    Nustep  L 5 6 min   LE only     Knee/Hip Exercises: Machines for Strengthening   Cybex Knee Extension  10lb 2 sets 10 hold TKE 3 sec,     Cybex Knee Flexion  25# 2 sets 15    Cybex Leg  Press  50# 2 sets 10, 30# SL 10 each      Knee/Hip Exercises: Standing   Heel Raises  Both;20 reps   black bar   Lateral Step Up  Both;15 reps;Hand Hold: 1;Step Height: 8"    Forward Step Up  Both;15 reps;Hand Hold: 1;Step Height: 8"    Other Standing Knee Exercises  BIL hip pulley 4 way 12 each 10 #      Cryotherapy   Number Minutes Cryotherapy  15 Minutes    Cryotherapy Location  Knee    Type of Cryotherapy  Ice pack      Electrical Stimulation   Electrical Stimulation Location  BIL knee    Electrical Stimulation Action  premod    Electrical Stimulation Goals  Pain      Manual Therapy   Manual Therapy  Joint mobilization;Soft tissue mobilization;Passive ROM    Joint Mobilization  increase ROM    Soft tissue mobilization  quads    Passive ROM  flex/ext BIL  PT Short Term Goals - 01/23/18 1556      PT SHORT TERM GOAL #1   Title  assure independent with HEP    Time  2    Period  Weeks    Status  Achieved        PT Long Term Goals - 03/06/18 0815      PT LONG TERM GOAL #1   Title  increase AROM to 5-115 degrees flexion    Baseline  slight decrease in flexion but pt reports more physically active    Status  Partially Met      PT LONG TERM GOAL #2   Title  walk without deviation all community distances    Status  Partially Met      PT LONG TERM GOAL #3   Title  decrease pain 50%    Status  Achieved      PT LONG TERM GOAL #4   Title  go up and down stairs step over step    Status  Achieved      PT LONG TERM GOAL #5   Title  mow yard without difficulty    Status  Achieved            Plan - 03/06/18 0816    Clinical Impression Statement  pt missed PT earlier this week and 10 min late today- reports alot going on at home( took down a tree and increased physical help to GF) Pt more stiff and tight today. Limited goal progress thsi week. Stiff with ex but loosened with tx    PT Treatment/Interventions  ADLs/Self Care Home  Management;Cryotherapy;Electrical Stimulation;Gait training;Neuromuscular re-education;Balance training;Therapeutic exercise;Therapeutic activities;Functional mobility training;Stair training;Patient/family education;Manual techniques;Vasopneumatic Device    PT Next Visit Plan  func strength and ROM       Patient will benefit from skilled therapeutic intervention in order to improve the following deficits and impairments:  Abnormal gait, Decreased activity tolerance, Decreased balance, Decreased mobility, Decreased strength, Increased edema, Impaired flexibility, Pain, Cardiopulmonary status limiting activity, Decreased endurance, Decreased range of motion, Difficulty walking  Visit Diagnosis: Acute pain of left knee  Difficulty in walking, not elsewhere classified  Stiffness of left knee, not elsewhere classified  Acute pain of right knee  Stiffness of right knee, not elsewhere classified  Localized edema     Problem List Patient Active Problem List   Diagnosis Date Noted  . Primary osteoarthritis of right knee 06/07/2017  . Primary osteoarthritis of left knee 06/07/2017  . Effusion, left knee 06/07/2017    Sylwia Cuervo,ANGIE PTA 03/06/2018, 8:33 AM  [address] [place], [place], [address] Phone: [phone]   Fax:  [phone]  Name: Peter Roth MRN: [id number] Date of Birth: [birth year]

## 2018-03-11 ENCOUNTER — Ambulatory Visit: Payer: Medicare Other | Admitting: Physical Therapy

## 2018-03-13 ENCOUNTER — Encounter: Payer: Self-pay | Admitting: Physical Therapy

## 2018-03-13 ENCOUNTER — Ambulatory Visit: Payer: Medicare Other | Attending: Orthopedic Surgery | Admitting: Physical Therapy

## 2018-03-13 DIAGNOSIS — R600 Localized edema: Secondary | ICD-10-CM | POA: Insufficient documentation

## 2018-03-13 DIAGNOSIS — R262 Difficulty in walking, not elsewhere classified: Secondary | ICD-10-CM | POA: Diagnosis present

## 2018-03-13 DIAGNOSIS — M25662 Stiffness of left knee, not elsewhere classified: Secondary | ICD-10-CM | POA: Diagnosis present

## 2018-03-13 DIAGNOSIS — M25562 Pain in left knee: Secondary | ICD-10-CM

## 2018-03-13 DIAGNOSIS — M25561 Pain in right knee: Secondary | ICD-10-CM | POA: Insufficient documentation

## 2018-03-13 DIAGNOSIS — M25661 Stiffness of right knee, not elsewhere classified: Secondary | ICD-10-CM | POA: Insufficient documentation

## 2018-03-13 NOTE — Therapy (Signed)
[address] [place], [place], [address] Phone: [phone]   Fax:  [phone]  Physical Therapy Treatment  Patient Details  Name: Peter Roth MRN: [id number] Date of Birth: 1960/09/05 Referring Provider: Berenice Roth   Encounter Date: 03/13/2018  PT End of Session - 03/13/18 0912    Visit Number  13    Date for PT Re-Evaluation  03/26/18    PT Start Time  0845    PT Stop Time  0940    PT Time Calculation (min)  55 min       Past Medical History:  Diagnosis Date  . Arthritis   . Bipolar 1 disorder ([place])   . Hyperlipidemia   . Hypertension   . Stress headaches   . Type 2 diabetes mellitus ([place])    followed by pcp  . Wears glasses     Past Surgical History:  Procedure Laterality Date  . KNEE ARTHROSCOPY Right ?date  . TOTAL KNEE ARTHROPLASTY Right 06/07/2017   Procedure: RIGHT TOTAL KNEE ARTHROPLASTY, INJECTION LEFT KNEE WITH FLUID ASPIRATION;  Surgeon: Dorna Leitz, MD;  Location: [facility];  Service: Orthopedics;  Laterality: Right;  . TOTAL KNEE ARTHROPLASTY Left 08/23/2017   Procedure: LEFT TOTAL KNEE ARTHROPLASTY;  Surgeon: Dorna Leitz, MD;  Location: [facility];  Service: Orthopedics;  Laterality: Left;    There were no vitals filed for this visit.  Subjective Assessment - 03/13/18 0849    Subjective  knees are getting better, I cut down a tree.  ( pt has missed a couple appts mixing up appt days and times)    Currently in Pain?  Yes    Pain Score  2     Pain Location  Knee    Pain Orientation  Left;Right         [facility] PT Assessment - 03/13/18 0001      AROM   Right Knee Flexion  110    Left Knee Flexion  114                   [facility] Adult PT Treatment/Exercise - 03/13/18 0001      Ambulation/Gait   Stairs  Yes    Stairs Assistance  6: Modified independent (Device/Increase time)    Stair Management Technique  Alternating pattern    Number of Stairs  20    Gait  Comments  amb 1000 feet all surfaces mod I. some deviations with incline and decline      Exercises   Exercises  Knee/Hip      Knee/Hip Exercises: Aerobic   Recumbent Bike   68mn full rev    Nustep  L 5 6 min   LE only     Knee/Hip Exercises: Machines for Strengthening   Cybex Knee Extension  10lb 2 sets 10 hold TKE 3 sec,    SL 10 # 10ea   Cybex Knee Flexion  25# 2 sets 15    Cybex Leg Press  50# 2 sets 10, 30# SL 10 each      Knee/Hip Exercises: Standing   Heel Raises  Both;20 reps    Lateral Step Up  Both;15 reps;Hand Hold: 1;Step Height: 8"    Forward Step Up  Both;15 reps;Hand Hold: 1;Step Height: 8"    Functional Squat  15 reps    Wall Squat  10 reps;3 seconds      Cryotherapy   Number Minutes Cryotherapy  15 Minutes    Cryotherapy  Location  Knee    Type of Cryotherapy  Ice pack      Electrical Stimulation   Electrical Stimulation Location  BIL knee    Electrical Stimulation Action  premod    Electrical Stimulation Goals  Pain               PT Short Term Goals - 01/23/18 1556      PT SHORT TERM GOAL #1   Title  assure independent with HEP    Time  2    Period  Weeks    Status  Achieved        PT Long Term Goals - 03/13/18 0902      PT LONG TERM GOAL #1   Title  increase AROM to 5-115 degrees flexion    Status  Partially Met      PT LONG TERM GOAL #2   Title  walk without deviation all community distances    Status  Partially Met      PT LONG TERM GOAL #3   Title  decrease pain 50%    Status  Achieved      PT LONG TERM GOAL #4   Title  go up and down stairs step over step    Status  Achieved      PT LONG TERM GOAL #5   Title  mow yard without difficulty    Status  Achieved            Plan - 03/13/18 0912    Clinical Impression Statement  progressing with goals. ROM remaining consistant over the past few weeks. pt reports increased func at home iwth less pain an dless deviations noted with walking and increased func flexion     PT Treatment/Interventions  ADLs/Self Care Home Management;Cryotherapy;Electrical Stimulation;Gait training;Neuromuscular re-education;Balance training;Therapeutic exercise;Therapeutic activities;Functional mobility training;Stair training;Patient/family education;Manual techniques;Vasopneumatic Device    PT Next Visit Plan  func strength and ROM       Patient will benefit from skilled therapeutic intervention in order to improve the following deficits and impairments:  Abnormal gait, Decreased activity tolerance, Decreased balance, Decreased mobility, Decreased strength, Increased edema, Impaired flexibility, Pain, Cardiopulmonary status limiting activity, Decreased endurance, Decreased range of motion, Difficulty walking  Visit Diagnosis: Acute pain of left knee  Difficulty in walking, not elsewhere classified  Stiffness of left knee, not elsewhere classified  Acute pain of right knee  Stiffness of right knee, not elsewhere classified  Localized edema     Problem List Patient Active Problem List   Diagnosis Date Noted  . Primary osteoarthritis of right knee 06/07/2017  . Primary osteoarthritis of left knee 06/07/2017  . Effusion, left knee 06/07/2017    Peter Roth,ANGIE PTA 03/13/2018, 9:14 AM  [address] [place], [place], [address] Phone: [phone]   Fax:  [phone]  Name: Peter Roth MRN: [id number] Date of Birth: 04-17-1961

## 2018-03-19 ENCOUNTER — Encounter: Payer: Self-pay | Admitting: Physical Therapy

## 2018-03-19 ENCOUNTER — Ambulatory Visit: Payer: Medicare Other | Admitting: Physical Therapy

## 2018-03-19 DIAGNOSIS — M25662 Stiffness of left knee, not elsewhere classified: Secondary | ICD-10-CM

## 2018-03-19 DIAGNOSIS — R600 Localized edema: Secondary | ICD-10-CM

## 2018-03-19 DIAGNOSIS — M25562 Pain in left knee: Secondary | ICD-10-CM

## 2018-03-19 DIAGNOSIS — M25661 Stiffness of right knee, not elsewhere classified: Secondary | ICD-10-CM

## 2018-03-19 DIAGNOSIS — R262 Difficulty in walking, not elsewhere classified: Secondary | ICD-10-CM

## 2018-03-19 DIAGNOSIS — M25561 Pain in right knee: Secondary | ICD-10-CM

## 2018-03-19 NOTE — Therapy (Signed)
[address] [place], [place], [address] Phone: [phone]   Fax:  [phone]  Physical Therapy Treatment  Patient Details  Name: Peter Roth MRN: [id number] Date of Birth: [birth year] Referring Provider: Berenice Primas   Encounter Date: 03/19/2018  PT End of Session - 03/19/18 0909    Visit Number  14    Date for PT Re-Evaluation  03/26/18    PT Start Time  0833    PT Stop Time  0930    PT Time Calculation (min)  57 min    Activity Tolerance  Patient tolerated treatment well    Behavior During Therapy  [facility] for tasks assessed/performed       Past Medical History:  Diagnosis Date  . Arthritis   . Bipolar 1 disorder ([place])   . Hyperlipidemia   . Hypertension   . Stress headaches   . Type 2 diabetes mellitus ([place])    followed by pcp  . Wears glasses     Past Surgical History:  Procedure Laterality Date  . KNEE ARTHROSCOPY Right ?date  . TOTAL KNEE ARTHROPLASTY Right 06/07/2017   Procedure: RIGHT TOTAL KNEE ARTHROPLASTY, INJECTION LEFT KNEE WITH FLUID ASPIRATION;  Surgeon: Dorna Leitz, MD;  [facility];  Service: Orthopedics;  Laterality: Right;  . TOTAL KNEE ARTHROPLASTY Left 08/23/2017   Procedure: LEFT TOTAL KNEE ARTHROPLASTY;  Surgeon: Dorna Leitz, MD;  [facility];  Service: Orthopedics;  Laterality: Left;    There were no vitals filed for this visit.  Subjective Assessment - 03/19/18 0836    Subjective  I am stiff, still working on cutting down tree    Currently in Pain?  Yes    Pain Score  2     Pain Location  Knee    Pain Orientation  Right;Left    Pain Descriptors / Indicators  Tightness    Aggravating Factors   activity, makes me stiff                       [facility] Adult PT Treatment/Exercise - 03/19/18 0001      Knee/Hip Exercises: Aerobic   Recumbent Bike   19mn full rev    Nustep  L 5 7 min      Knee/Hip Exercises: Machines for Strengthening   Cybex Knee Extension  10lb 2 sets 10 hold TKE 3 sec,  then 5# single legs    Cybex Knee Flexion  25# 2 sets 15, single legs 20#    Cybex Leg Press  20 working on low, getting more flexion      Knee/Hip Exercises: Standing   Walking with Sports Cord  Black TB in hallway       Cryotherapy   Cryotherapy Location  Knee    Type of Cryotherapy  Ice pack      Electrical Stimulation   Electrical Stimulation Location  BIL knee    Electrical Stimulation Action  premod    Electrical Stimulation Parameters  supine    Electrical Stimulation Goals  Pain;Edema      Manual Therapy   Manual Therapy  Passive ROM    Passive ROM  working on flexion had in prone and in sitting             PT Education - 03/19/18 0909    Education provided  Yes    Education Details  really reinforced and reissued the low load long sitting stretch to gain flexion  Person(s) Educated  Patient    Methods  Explanation;Demonstration;Tactile cues;Verbal cues;Handout    Comprehension  Verbalized understanding;Returned demonstration;Verbal cues required;Tactile cues required       PT Short Term Goals - 01/23/18 1556      PT SHORT TERM GOAL #1   Title  assure independent with HEP    Time  2    Period  Weeks    Status  Achieved        PT Long Term Goals - 03/19/18 0911      PT LONG TERM GOAL #2   Title  walk without deviation all community distances    Status  Partially Met            Plan - 03/19/18 0910    Clinical Impression Statement  Patient's biggest c/o is stiffness and weakness.  He really did not tolerate the flexion stretches in prone due to c/o pain in the anterior knee, as he relaxed he did c/o quad stretch.  He at times is still stiff with his gait by not bending the knee with some verbal cues he corrects but tends to revert back to the stiff leg gait    PT Next Visit Plan  work on his ROM    Consulted and Agree with Plan of Care  Patient       Patient will benefit from skilled  therapeutic intervention in order to improve the following deficits and impairments:  Abnormal gait, Decreased activity tolerance, Decreased balance, Decreased mobility, Decreased strength, Increased edema, Impaired flexibility, Pain, Cardiopulmonary status limiting activity, Decreased endurance, Decreased range of motion, Difficulty walking  Visit Diagnosis: Acute pain of left knee  Difficulty in walking, not elsewhere classified  Stiffness of left knee, not elsewhere classified  Acute pain of right knee  Stiffness of right knee, not elsewhere classified  Localized edema     Problem List Patient Active Problem List   Diagnosis Date Noted  . Primary osteoarthritis of right knee 06/07/2017  . Primary osteoarthritis of left knee 06/07/2017  . Effusion, left knee 06/07/2017    Sumner Boast., PT 03/19/2018, 9:12 AM  [address] [place], [place], [address] Phone: [phone]   Fax:  [phone]  Name: Peter Roth MRN: [id number] Date of Birth: 26-Nov-1960

## 2018-03-21 ENCOUNTER — Ambulatory Visit: Payer: Medicare Other | Admitting: Physical Therapy

## 2018-03-21 DIAGNOSIS — M25662 Stiffness of left knee, not elsewhere classified: Secondary | ICD-10-CM

## 2018-03-21 DIAGNOSIS — M25562 Pain in left knee: Secondary | ICD-10-CM

## 2018-03-21 DIAGNOSIS — R262 Difficulty in walking, not elsewhere classified: Secondary | ICD-10-CM

## 2018-03-21 NOTE — Therapy (Signed)
[place], [place], [address] Phone: [phone]   Fax:  [phone]  Physical Therapy Treatment  Patient Details  Name: Peter Roth MRN: [id number] Date of Birth: 1961/02/06 Referring Provider: Berenice Primas   Encounter Date: 03/21/2018  PT End of Session - 03/21/18 0850    Visit Number  15    Date for PT Re-Evaluation  03/26/18    PT Start Time  0849    PT Stop Time  0947    PT Time Calculation (min)  58 min    Activity Tolerance  Patient tolerated treatment well    Behavior During Therapy  [facility] for tasks assessed/performed       Past Medical History:  Diagnosis Date  . Arthritis   . Bipolar 1 disorder ([place])   . Hyperlipidemia   . Hypertension   . Stress headaches   . Type 2 diabetes mellitus ([place])    followed by pcp  . Wears glasses     Past Surgical History:  Procedure Laterality Date  . KNEE ARTHROSCOPY Right ?date  . TOTAL KNEE ARTHROPLASTY Right 06/07/2017   Procedure: RIGHT TOTAL KNEE ARTHROPLASTY, INJECTION LEFT KNEE WITH FLUID ASPIRATION;  Surgeon: Dorna Leitz, MD;  [facility]: [facility];  Service: Orthopedics;  Laterality: Right;  . TOTAL KNEE ARTHROPLASTY Left 08/23/2017   Procedure: LEFT TOTAL KNEE ARTHROPLASTY;  Surgeon: Dorna Leitz, MD;  [facility];  Service: Orthopedics;  Laterality: Left;    There were no vitals filed for this visit.  Subjective Assessment - 03/21/18 0850    Subjective  just stiff    Patient Stated Goals  have less pain, walk better    Currently in Pain?  No/denies         [facility] PT Assessment - 03/21/18 0001      AROM   Right Knee Flexion  95    Left Knee Flexion  108      PROM   Right Knee Flexion  101    Left Knee Flexion  115                   [facility] Adult PT Treatment/Exercise - 03/21/18 0001      Exercises   Exercises  Knee/Hip      Knee/Hip Exercises: Stretches   Other Knee/Hip Stretches  passive stretch on leg  press seat 5 2 x 60 sec    Other Knee/Hip Stretches  overpressure into flexion using fitter x 10 each leg      Knee/Hip Exercises: Aerobic   Recumbent Bike   72mn full rev    Nustep  L 5 7 min      Knee/Hip Exercises: Machines for Strengthening   Cybex Knee Extension  10lb 2 sets 10 hold TKE 3 sec,  then 5# single legs    Cybex Knee Flexion  25# 2 sets 15, single legs 20#    Cybex Leg Press  20 working on low, getting more flexion      Modalities   Modalities  ETeacher, English as a foreign languageLocation  bil knee    Electrical Stimulation Action  premod    Electrical Stimulation Parameters  supine    Electrical Stimulation Goals  Pain;Edema               PT Short Term Goals - 01/23/18 1556      PT SHORT TERM GOAL #1  Title  assure independent with HEP    Time  2    Period  Weeks    Status  Achieved        PT Long Term Goals - 03/21/18 1052      PT LONG TERM GOAL #1   Title  increase AROM to 5-115 degrees flexion    Baseline  decrease in flexion but reports compliance with stretching    Time  8    Period  Weeks    Status  Partially Met      PT LONG TERM GOAL #2   Title  walk without deviation all community distances    Time  8    Period  Weeks    Status  Partially Met            Plan - 03/21/18 0934    Clinical Impression Statement  Patient continues to c/o stiffness. He tolerated passive stretching using the fitter and leg press very well today and was able to move forward on bike from seat 7 to 6 to increase flexion. ROM goals are ongoing.    PT Frequency  2x / week    PT Duration  8 weeks    PT Treatment/Interventions  ADLs/Self Care Home Management;Cryotherapy;Electrical Stimulation;Gait training;Neuromuscular re-education;Balance training;Therapeutic exercise;Therapeutic activities;Functional mobility training;Stair training;Patient/family education;Manual techniques;Vasopneumatic Device    PT Next  Visit Plan  work on his ROM and gait pattern    Consulted and Agree with Plan of Care  Patient       Patient will benefit from skilled therapeutic intervention in order to improve the following deficits and impairments:  Abnormal gait, Decreased activity tolerance, Decreased balance, Decreased mobility, Decreased strength, Increased edema, Impaired flexibility, Pain, Cardiopulmonary status limiting activity, Decreased endurance, Decreased range of motion, Difficulty walking  Visit Diagnosis: Acute pain of left knee  Difficulty in walking, not elsewhere classified  Stiffness of left knee, not elsewhere classified     Problem List Patient Active Problem List   Diagnosis Date Noted  . Primary osteoarthritis of right knee 06/07/2017  . Primary osteoarthritis of left knee 06/07/2017  . Effusion, left knee 06/07/2017    Donyae Kilner PT 03/21/2018, 10:53 AM  [place] [address] [place], [place], [address] Phone: [phone]   Fax:  [phone]  Name: MALYK GIROUARD MRN: [id number] Date of Birth: 02/26/1961

## 2018-03-25 ENCOUNTER — Ambulatory Visit: Payer: Medicare Other | Admitting: Physical Therapy

## 2018-03-25 DIAGNOSIS — M25562 Pain in left knee: Secondary | ICD-10-CM

## 2018-03-25 DIAGNOSIS — M25662 Stiffness of left knee, not elsewhere classified: Secondary | ICD-10-CM

## 2018-03-25 DIAGNOSIS — M25561 Pain in right knee: Secondary | ICD-10-CM

## 2018-03-25 DIAGNOSIS — M25661 Stiffness of right knee, not elsewhere classified: Secondary | ICD-10-CM

## 2018-03-25 DIAGNOSIS — R262 Difficulty in walking, not elsewhere classified: Secondary | ICD-10-CM

## 2018-03-25 NOTE — Therapy (Signed)
[place] [address] [address] [place], [place], [address] Phone: [phone]   Fax:  [phone]  Physical Therapy Treatment  Patient Details  Name: Peter Roth MRN: [id number] Date of Birth: 10-03-1960 Referring Provider: Berenice Primas   Encounter Date: 03/25/2018  PT End of Session - 03/25/18 0903    Visit Number  16    Date for PT Re-Evaluation  03/26/18    PT Start Time  0845    PT Stop Time  0945    PT Time Calculation (min)  60 min       Past Medical History:  Diagnosis Date  . Arthritis   . Bipolar 1 disorder ([place])   . Hyperlipidemia   . Hypertension   . Stress headaches   . Type 2 diabetes mellitus ([place])    followed by pcp  . Wears glasses     Past Surgical History:  Procedure Laterality Date  . KNEE ARTHROSCOPY Right ?date  . TOTAL KNEE ARTHROPLASTY Right 06/07/2017   Procedure: RIGHT TOTAL KNEE ARTHROPLASTY, INJECTION LEFT KNEE WITH FLUID ASPIRATION;  Surgeon: Dorna Leitz, MD;  [facility];  Service: Orthopedics;  Laterality: Right;  . TOTAL KNEE ARTHROPLASTY Left 08/23/2017   Procedure: LEFT TOTAL KNEE ARTHROPLASTY;  Surgeon: Dorna Leitz, MD;  [facility];  Service: Orthopedics;  Laterality: Left;    There were no vitals filed for this visit.  Subjective Assessment - 03/25/18 0908    Subjective  sore after lastsession    Currently in Pain?  Yes    Pain Score  1     Pain Location  Knee    Pain Orientation  Right;Left         [facility] PT Assessment - 03/25/18 0001      AROM   Right Knee Flexion  110    Left Knee Flexion  112                   [facility] Adult PT Treatment/Exercise - 03/25/18 0001      Knee/Hip Exercises: Aerobic   Elliptical  3 fwd/3 back I 10 R 5    Recumbent Bike   24mn full rev    Nustep  L 5 7 min      Knee/Hip Exercises: Machines for Strengthening   Cybex Knee Extension  10lb 2 sets 10 hold TKE 3 sec,  then 5# single legs    Cybex Knee Flexion   25# 2 sets 15, single legs 20#      Knee/Hip Exercises: Standing   Lateral Step Up  Both;15 reps;Hand Hold: 1;Step Height: 8"    Forward Step Up  Both;15 reps;Hand Hold: 1;Step Height: 8"    Walking with Sports Cord  Black TB in hallway     Other Standing Knee Exercises  6 inch step down with tap 15 each   working on ecc flexion     Cryotherapy   Number Minutes Cryotherapy  15 Minutes    Cryotherapy Location  Knee    Type of Cryotherapy  Ice pack      Electrical Stimulation   Electrical Stimulation Location  bil knee    Electrical Stimulation Action  premod    Electrical Stimulation Parameters  supine    Electrical Stimulation Goals  Edema;Pain      Manual Therapy   Manual Therapy  Passive ROM    Passive ROM  focus on flexion  PT Education - 03/25/18 [phone]    Education provided  Yes    Education Details  HEP to continue with strength and ROM ( pt with nustep at home)    Person(s) Educated  Patient    Methods  Explanation    Comprehension  Verbalized understanding       PT Short Term Goals - 01/23/18 1556      PT SHORT TERM GOAL #1   Title  assure independent with HEP    Time  2    Period  Weeks    Status  Achieved        PT Long Term Goals - 03/25/18 3662      PT LONG TERM GOAL #1   Title  increase AROM to 5-115 degrees flexion    Status  Partially Met      PT LONG TERM GOAL #2   Title  walk without deviation all community distances    Status  Achieved      PT LONG TERM GOAL #3   Title  decrease pain 50%    Status  Achieved      PT LONG TERM GOAL #4   Title  go up and down stairs step over step    Status  Achieved            Plan - 03/25/18 0904    Clinical Impression Statement  all goals met except flexion goal. flexion varies with paina nd activitiy but overall pt is doing better an dless pain with all func activities    PT Treatment/Interventions  ADLs/Self Care Home Management;Cryotherapy;Electrical Stimulation;Gait  training;Neuromuscular re-education;Balance training;Therapeutic exercise;Therapeutic activities;Functional mobility training;Stair training;Patient/family education;Manual techniques;Vasopneumatic Device    PT Next Visit Plan  D/C       Patient will benefit from skilled therapeutic intervention in order to improve the following deficits and impairments:  Abnormal gait, Decreased activity tolerance, Decreased balance, Decreased mobility, Decreased strength, Increased edema, Impaired flexibility, Pain, Cardiopulmonary status limiting activity, Decreased endurance, Decreased range of motion, Difficulty walking  Visit Diagnosis: Acute pain of left knee  Difficulty in walking, not elsewhere classified  Stiffness of left knee, not elsewhere classified  Stiffness of right knee, not elsewhere classified  Acute pain of right knee     Problem List Patient Active Problem List   Diagnosis Date Noted  . Primary osteoarthritis of right knee 06/07/2017  . Primary osteoarthritis of left knee 06/07/2017  . Effusion, left knee 06/07/2017  PHYSICAL THERAPY DISCHARGE SUMMARY   Plan: Patient agrees to discharge.  Patient goals were partially met. Patient is being discharged due to meeting the stated rehab goals.  ?????      Daisey Caloca,ANGIE PTA 03/25/2018, 9:19 AM  [place], [place], [address] Phone: [phone]   Fax:  [phone]  Name: Peter Roth MRN: [id number] Date of Birth: Jul 04, 1961

## 2018-03-27 ENCOUNTER — Ambulatory Visit: Payer: Medicare Other | Admitting: Physical Therapy

## 2018-04-01 ENCOUNTER — Ambulatory Visit: Payer: Medicare Other | Admitting: Physical Therapy

## 2018-04-03 ENCOUNTER — Encounter: Payer: Medicare Other | Admitting: Physical Therapy

## 2019-03-22 IMAGING — DX DG KNEE COMPLETE 4+V*R*
4 series · 4 of 4 positions shown · non-contrast
Comparison: Right knee series dated November 23, 2015

CLINICAL DATA: Several month history of right knee pain. The
patient has undergone previous arthroscopy and arthrocentesis.
Persistent instability and false.

EXAM:
RIGHT KNEE - COMPLETE 4+ VIEW

[knee ap]
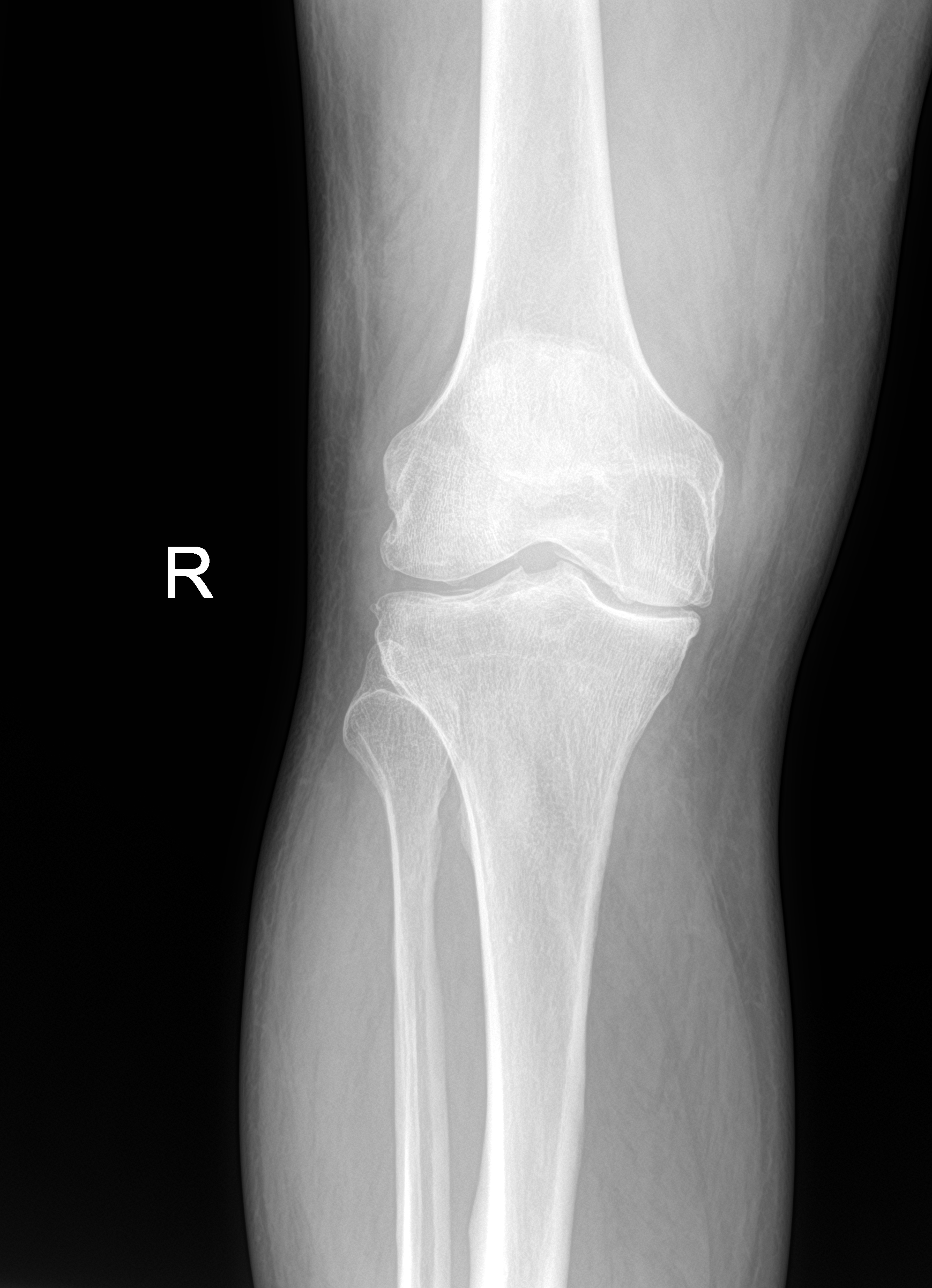

[knee lat]
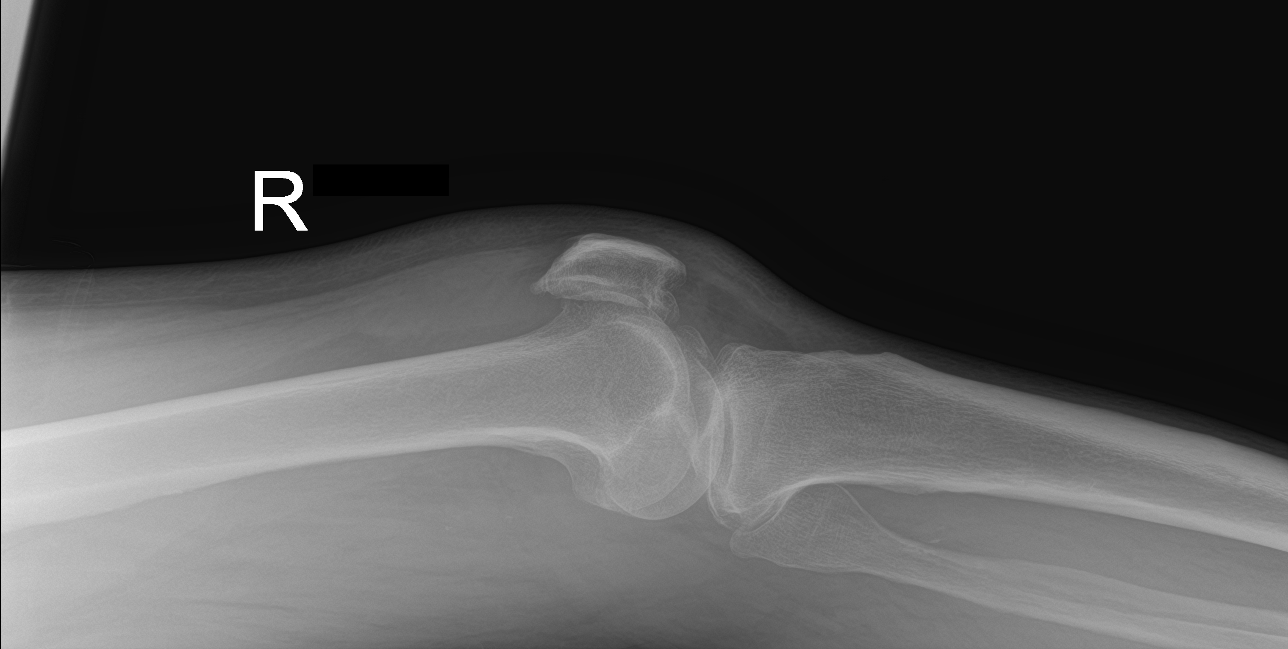

[knee obl (1 of 2)]
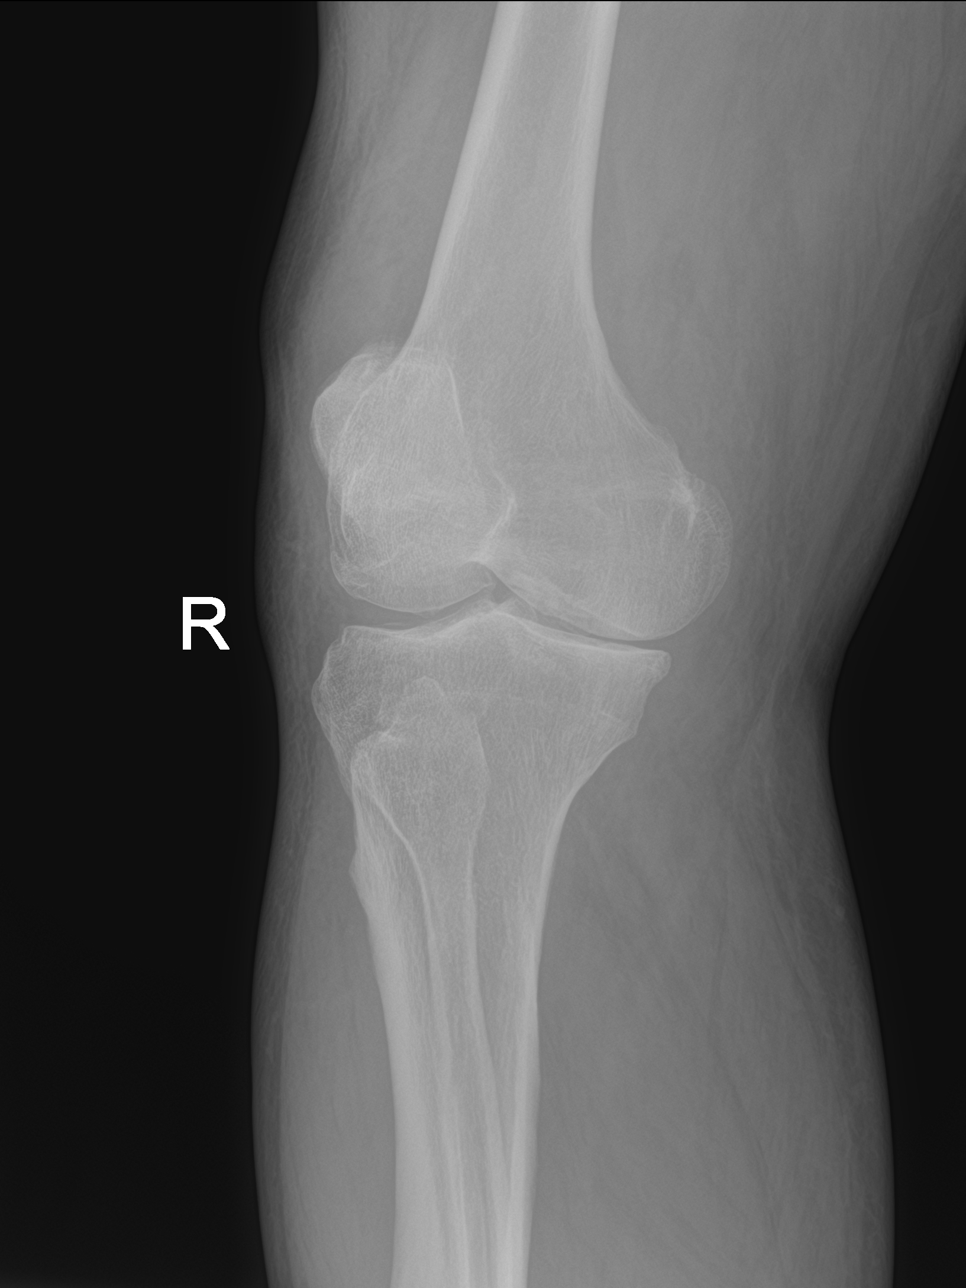

[knee obl (2 of 2)]
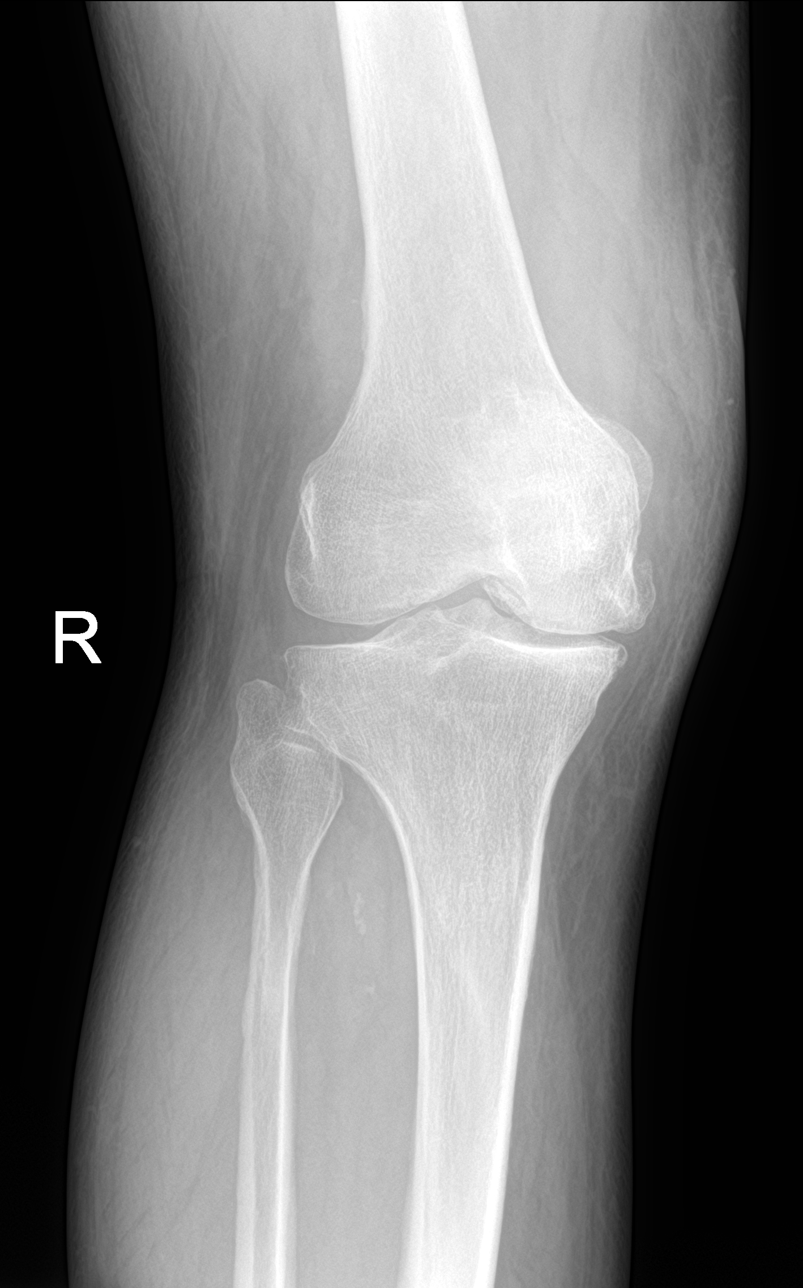

[4 of 4 positions shown; findings below may reference images not displayed]

FINDINGS: The bones are subjectively adequately mineralized. There is moderate
narrowing of the medial joint space with milder narrowing laterally
and of the patellofemoral joint. There is beaking of the tibial
spines. Spurs arise from the articular margins of the patella. There
is no acute fracture or dislocation. There is a small suprapatellar
effusion.
IMPRESSION: Moderate osteoarthritic change centered on the medial compartment
with milder changes laterally and of the patellofemoral compartment.
There is no acute fracture. There is a small suprapatellar effusion.

## 2019-04-03 IMAGING — DX DG CHEST 2V
2 series · 2 of 2 positions shown · non-contrast
Comparison: 11/21/2015

CLINICAL DATA: Pre-op respiratory exam for knee arthroplasty.
Hypertension.

EXAM:
CHEST  2 VIEW

[chest pa]
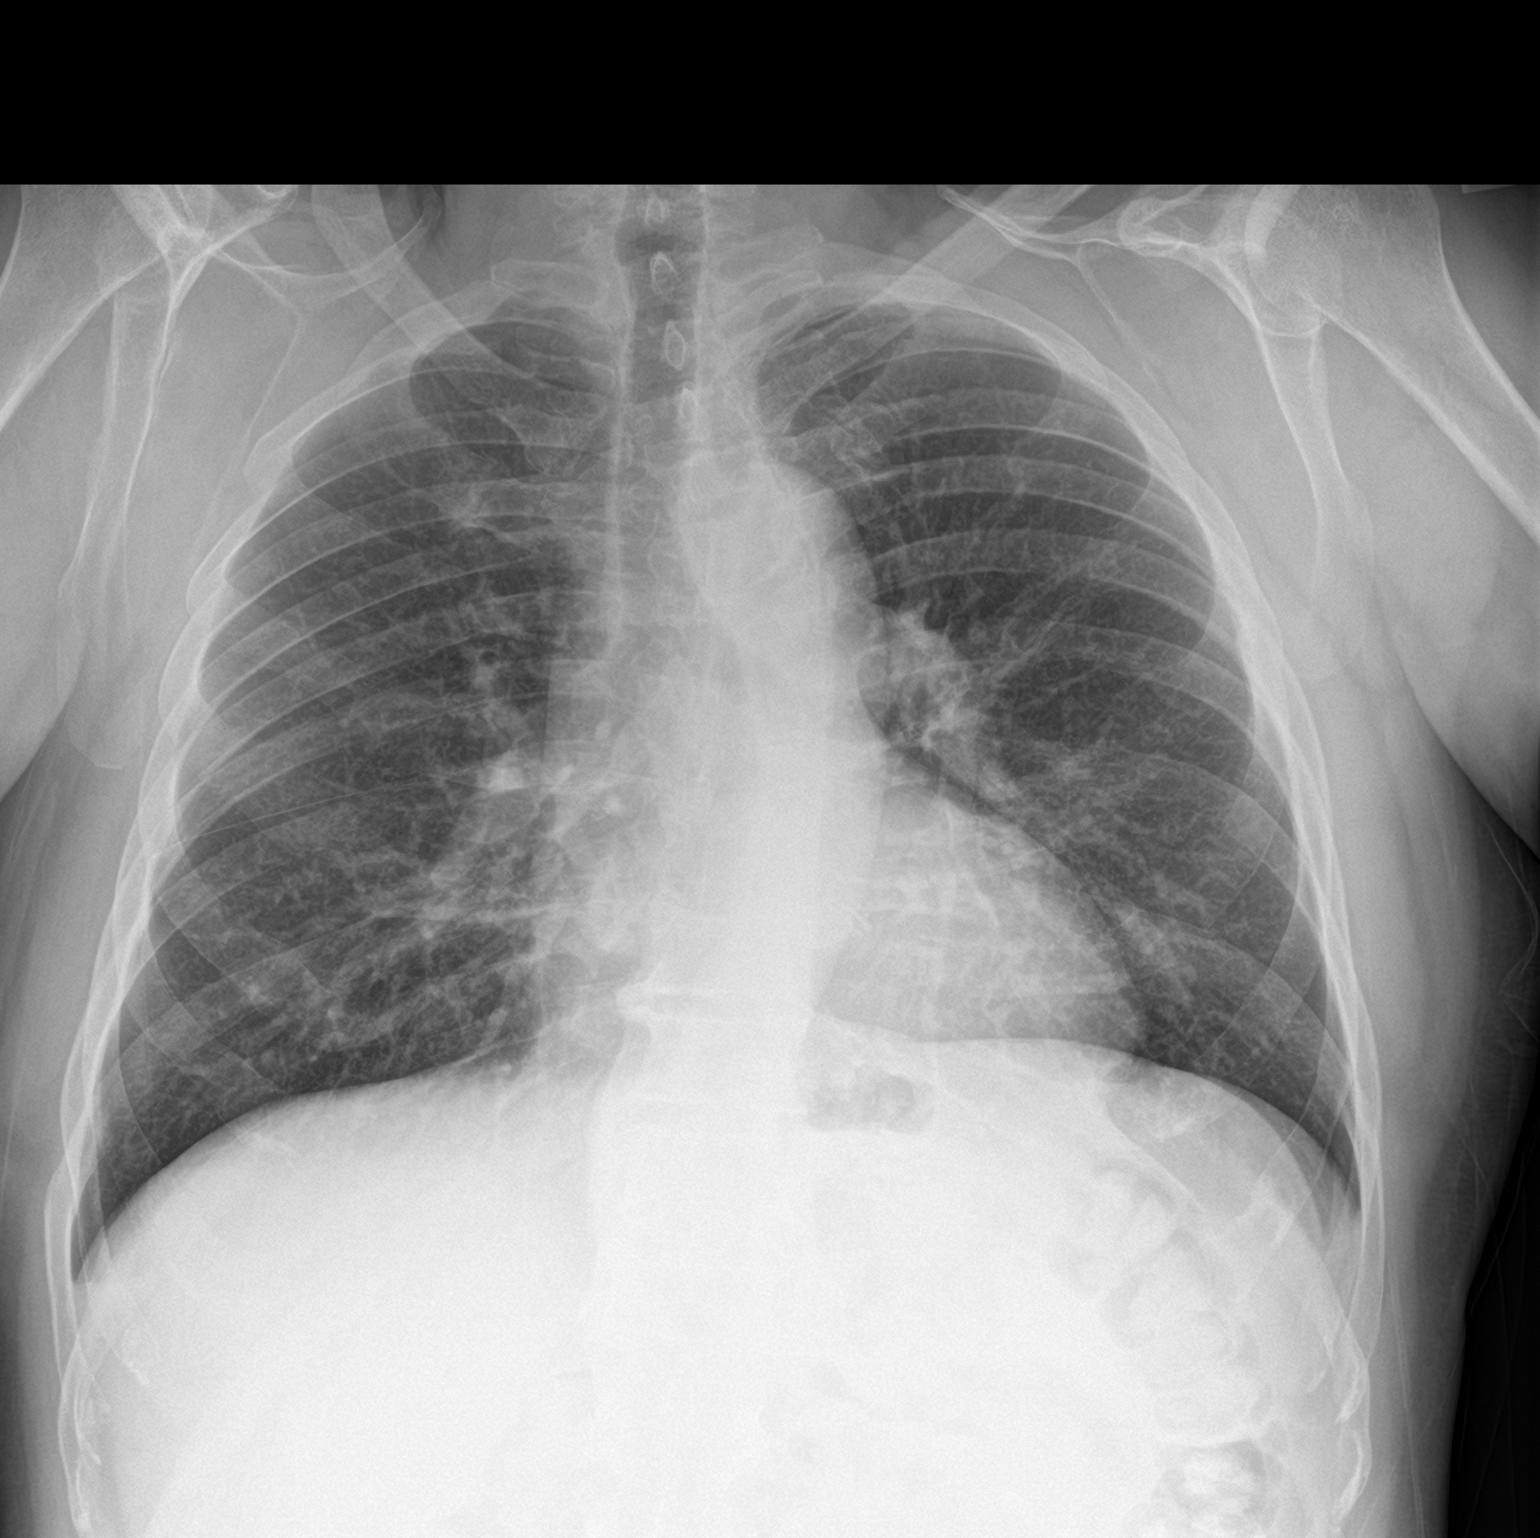

[chest lat]
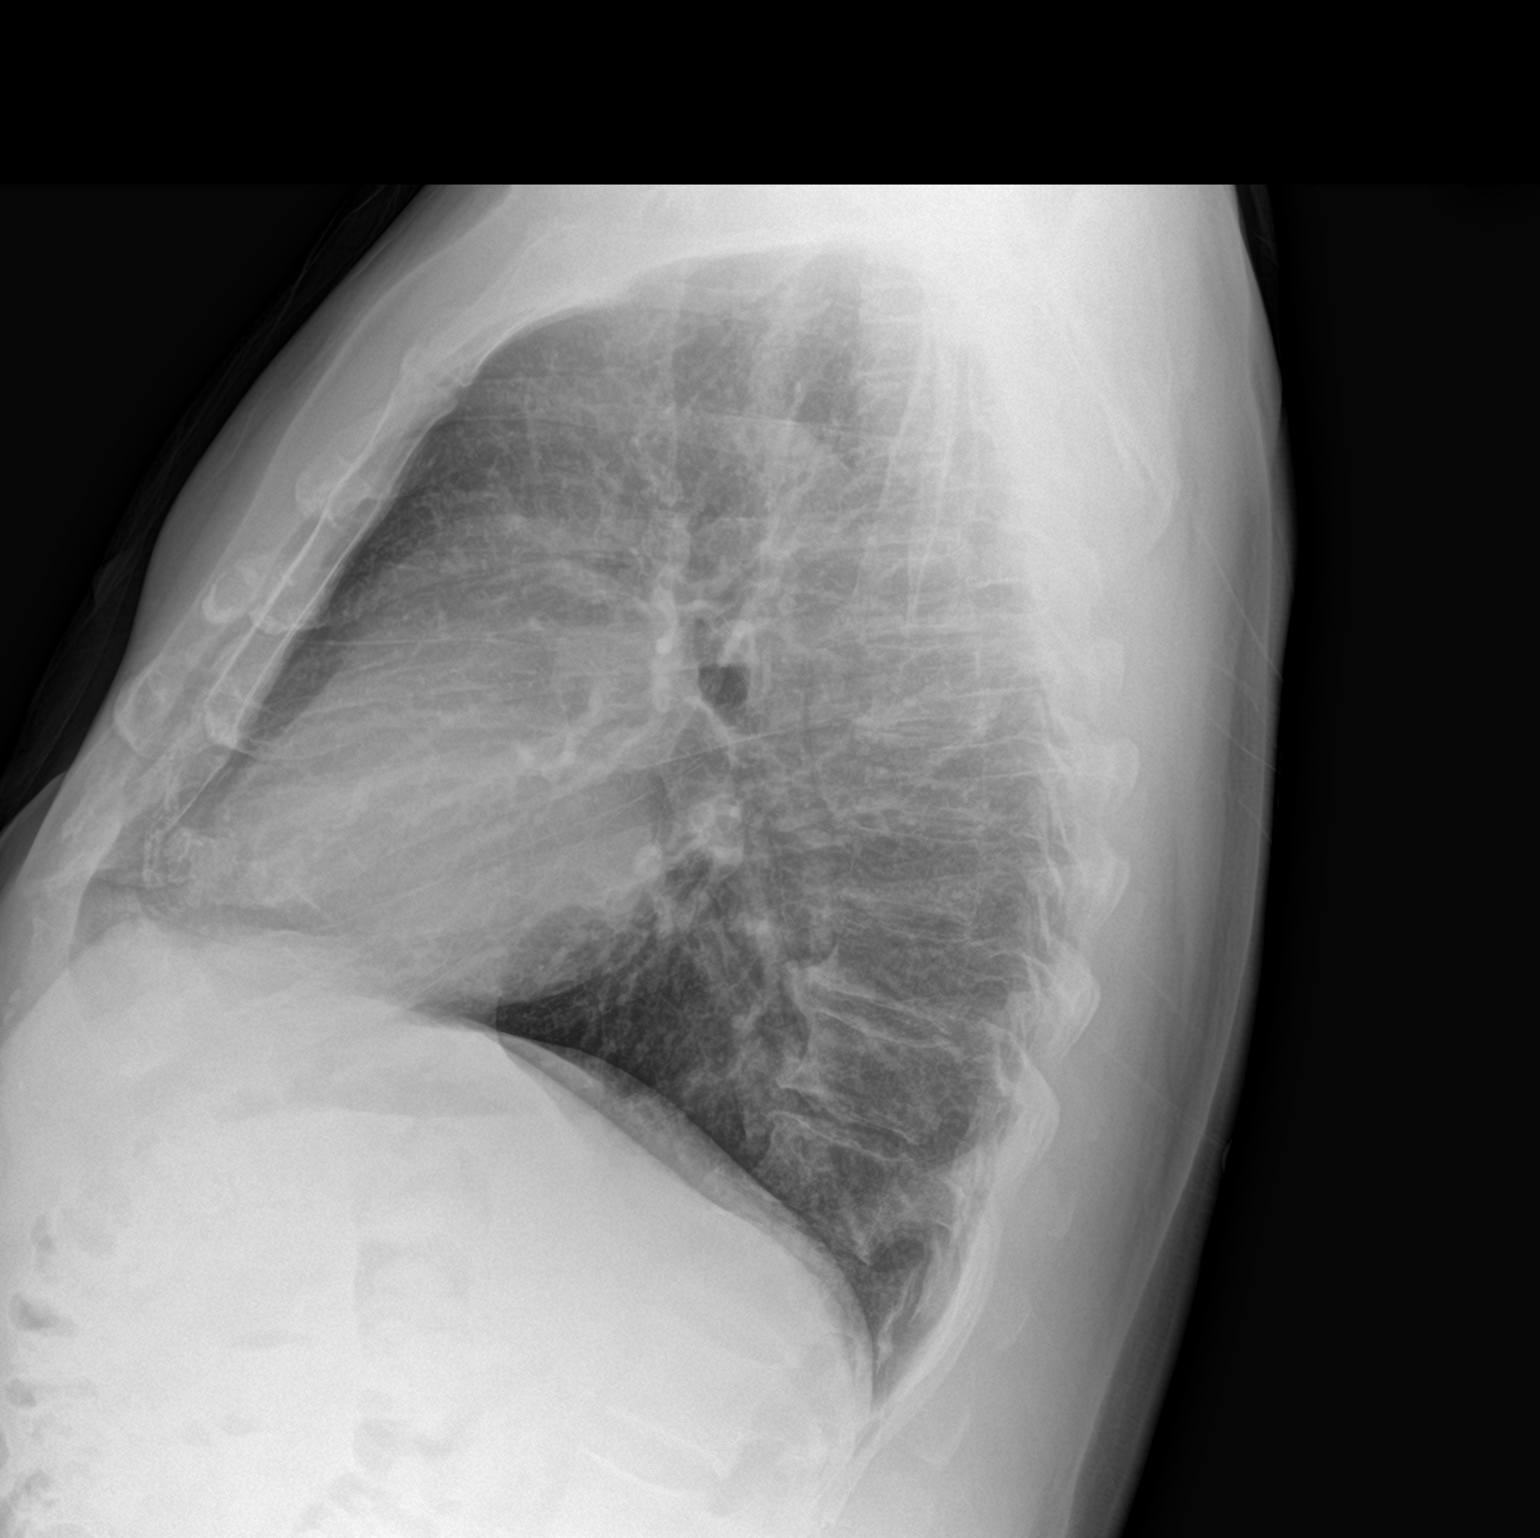

[2 of 2 positions shown; findings below may reference images not displayed]

FINDINGS: The heart size and mediastinal contours are within normal limits.
Both lungs are clear. The visualized skeletal structures are
unremarkable.
IMPRESSION: Stable exam.  No active cardiopulmonary disease.

## 2019-09-10 ENCOUNTER — Other Ambulatory Visit: Payer: Self-pay

## 2019-09-10 ENCOUNTER — Ambulatory Visit: Payer: Medicare Other | Attending: Orthopedic Surgery | Admitting: Physical Therapy

## 2019-09-10 DIAGNOSIS — M6281 Muscle weakness (generalized): Secondary | ICD-10-CM | POA: Insufficient documentation

## 2019-09-10 DIAGNOSIS — R262 Difficulty in walking, not elsewhere classified: Secondary | ICD-10-CM | POA: Diagnosis not present

## 2019-09-10 DIAGNOSIS — R29898 Other symptoms and signs involving the musculoskeletal system: Secondary | ICD-10-CM | POA: Insufficient documentation

## 2019-09-10 DIAGNOSIS — M25662 Stiffness of left knee, not elsewhere classified: Secondary | ICD-10-CM | POA: Insufficient documentation

## 2019-09-10 DIAGNOSIS — M25661 Stiffness of right knee, not elsewhere classified: Secondary | ICD-10-CM | POA: Diagnosis present

## 2019-09-10 NOTE — Therapy (Signed)
[facility] [address] [place], [place], [address] Phone: [phone]   Fax:  [phone]  Physical Therapy Evaluation  Patient Details  Name: Peter Roth MRN: [id number] Date of Birth: [birth year] Referring Provider (PT): Dr Jodi Geralds   Encounter Date: 09/10/2019  PT End of Session - 09/10/19 0940    Visit Number  1    Number of Visits  12    Date for PT Re-Evaluation  10/22/19    Authorization Type  [employer] and [employer] - submitted for initial 3 V    PT Start Time  0940    Activity Tolerance  Patient tolerated treatment well    Behavior During Therapy  [facility] for tasks assessed/performed       Past Medical History:  Diagnosis Date  . Arthritis   . Bipolar 1 disorder ([facility])   . Hyperlipidemia   . Hypertension   . Stress headaches   . Type 2 diabetes mellitus ([facility])    followed by pcp  . Wears glasses     Past Surgical History:  Procedure Laterality Date  . KNEE ARTHROSCOPY Right ?date  . TOTAL KNEE ARTHROPLASTY Right 06/07/2017   Procedure: RIGHT TOTAL KNEE ARTHROPLASTY, INJECTION LEFT KNEE WITH FLUID ASPIRATION;  Surgeon: Jodi Geralds, MD;  [facility];  Service: Orthopedics;  Laterality: Right;  . TOTAL KNEE ARTHROPLASTY Left 08/23/2017   Procedure: LEFT TOTAL KNEE ARTHROPLASTY;  Surgeon: Jodi Geralds, MD;  [facility];  Service: Orthopedics;  Laterality: Left;    There were no vitals filed for this visit.   Subjective Assessment - 09/10/19 0940    Subjective  Pt reports he wants to get stronger and has issues with his knees since surgery. Sometimes when its damp outside his knees and Rt hip bother him    Patient Stated Goals  get stronger, be able to get out more since its getting stronger and return to lawn care    Currently in Pain?  No/denies         [facility] PT Assessment - 09/10/19 0001      Assessment   Medical Diagnosis  Bilat TKAs    Referring Provider (PT)  Dr Jodi Geralds    Onset  Date/Surgical Date  09/10/18    Next MD Visit  10/05/2019    Prior Therapy  yes      Precautions   Precaution Comments  MD took him out of work right now until he is stronger      Balance Screen   Has the patient fallen in the past 6 months  Yes    How many times?  a few times    Has the patient had a decrease in activity level because of a fear of falling?   No    Is the patient reluctant to leave their home because of a fear of falling?   No      Home Environment   Living Environment  Private residence    Home Layout  One level      Prior Function   Level of Independence  Independent    Leisure  wishes to return to work      ROM / Strength   AROM / PROM / Strength  AROM;Strength      AROM   AROM Assessment Site  Hip;Knee;Ankle    Right/Left Knee  Left;Right    Right Knee Extension  0    Right Knee Flexion  110  Left Knee Extension  -5    Left Knee Flexion  100    Right/Left Ankle  --   DR Rt 10, Lt 5     Strength   Strength Assessment Site  Hip;Knee;Ankle    Right/Left Hip  Right;Left    Right Hip Flexion  4+/5    Right Hip Extension  3+/5    Right Hip ABduction  4-/5    Left Hip Flexion  4-/5    Left Hip Extension  4-/5    Left Hip ABduction  4-/5    Right/Left Knee  --   bilat HS 4/5, quads 4+/5   Right/Left Ankle  --   WNL     Flexibility   Soft Tissue Assessment /Muscle Length  --   tight HS and Quads               Objective measurements completed on examination: See above findings.      [facility] Adult PT Treatment/Exercise - 09/10/19 0001      Exercises   Exercises  Knee/Hip      Knee/Hip Exercises: Stretches   Active Hamstring Stretch  Both;30 seconds      Knee/Hip Exercises: Aerobic   Recumbent Bike  x5'    Nustep  L5x5'      Knee/Hip Exercises: Machines for Strengthening   Cybex Knee Extension  25# x 20 reps    Cybex Knee Flexion  25# x 20reps    Cybex Leg Press  70# x 20 reps             PT Education - 09/10/19 0952     Education Details  POC    Person(s) Educated  Patient    Methods  Explanation;Demonstration;Handout    Comprehension  Returned demonstration;Verbalized understanding          PT Long Term Goals - 09/10/19 0954      PT LONG TERM GOAL #1   Title  I with HEP to include a walking program ( 10/22/2019)    Baseline  has stopped exercising since Covid    Time  6    Period  Weeks    Status  New    Target Date  10/22/19      PT LONG TERM GOAL #2   Title  increase bilat hip strength =/> 5-/5 to allow him to transfer floor to stand I'ly ( 10/22/2019)    Baseline  (S) 3+/5 to 4+/5 in his hips,    Time  6    Period  Weeks    Status  New    Target Date  10/22/19      PT LONG TERM GOAL #3   Title  increase felxibility of bilat hamstrings to 90 degrees to decrease pain in hips and low back ( 10/22/2019)    Baseline  tight bilat ~ 75 degrees - pain in hip and back with a lot of activity    Time  6    Period  Weeks    Status  New    Target Date  10/22/19      PT LONG TERM GOAL #4   Title  walk on uneven surfaces / grass without loss of balance 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  Presents for strengthening of bilat knees/hips.  He has stopped doing his exercise during covid. He wished to return to landscaping work and he will need to be able to get down on the ground for this.  He has weakness in bilat hips and knees along with decreased tolerance to physical activity. Would benefit from PT to improve strength and improve safety with walking on uneven surfaces    Personal Factors and Comorbidities  Behavior Pattern;Past/Current Experience;Social Background     Examination-Activity Limitations  Squat;Other    Examination-Participation Restrictions  Community Activity;Yard Work;Other    Stability/Clinical Decision Making  Stable/Uncomplicated    Clinical Decision Making  Low    Rehab Potential  Good    PT Frequency  2x / week    PT Duration  6 weeks    PT Treatment/Interventions  Gait training;Taping;Patient/family education;Functional mobility training;Moist Heat;Therapeutic activities;Therapeutic exercise;Cryotherapy;Electrical Stimulation;Manual techniques;Neuromuscular re-education;Dry needling    PT Next Visit Plan  strengthening and floor to stand transfers    Consulted and Agree with Plan of Care  Patient       Patient will benefit from skilled therapeutic intervention in order to improve the following deficits and impairments:  Difficulty walking, Pain, Decreased activity tolerance, Decreased balance, Decreased strength  Visit Diagnosis: Difficulty in walking, not elsewhere classified - Plan: PT plan of care cert/re-cert  Muscle weakness (generalized) - Plan: PT plan of care cert/re-cert  Other symptoms and signs involving the musculoskeletal system - Plan: PT plan of care cert/re-cert     Problem List Patient Active Problem List   Diagnosis Date Noted  . Primary osteoarthritis of right knee 06/07/2017  . Primary osteoarthritis of left knee 06/07/2017  . Effusion, left knee 06/07/2017    Roderic Scarce PT  09/10/2019, 10:13 AM  [facility] [place] [address] [place], [place], [address] Phone: [phone]   Fax:  [phone]  Name: Peter Roth MRN: [id number] Date of Birth: 10-24-1960

## 2019-09-17 ENCOUNTER — Other Ambulatory Visit: Payer: Self-pay

## 2019-09-17 ENCOUNTER — Encounter: Payer: Self-pay | Admitting: Physical Therapy

## 2019-09-17 ENCOUNTER — Ambulatory Visit: Payer: Medicare Other | Admitting: Physical Therapy

## 2019-09-17 DIAGNOSIS — R262 Difficulty in walking, not elsewhere classified: Secondary | ICD-10-CM

## 2019-09-17 DIAGNOSIS — M6281 Muscle weakness (generalized): Secondary | ICD-10-CM

## 2019-09-17 NOTE — Therapy (Signed)
[facility] [place] [address] [place], [place], [address] Phone: [phone]   Fax:  [phone]  Physical Therapy Treatment  Patient Details  Name: HUSAYN REIM MRN: [id number] Date of Birth: 04-11-1961 Referring Provider (PT): Dr Jodi Geralds   Encounter Date: 09/17/2019  PT End of Session - 09/17/19 0924    Visit Number  2    Date for PT Re-Evaluation  10/22/19    Authorization Type  [employer] and M[employer] - submitted for initial 3 V    PT Start Time  0850    PT Stop Time  0927    PT Time Calculation (min)  37 min    Activity Tolerance  Patient tolerated treatment well       Past Medical History:  Diagnosis Date  . Arthritis   . Bipolar 1 disorder ([facility])   . Hyperlipidemia   . Hypertension   . Stress headaches   . Type 2 diabetes mellitus ([facility])    followed by pcp  . Wears glasses     Past Surgical History:  Procedure Laterality Date  . KNEE ARTHROSCOPY Right ?date  . TOTAL KNEE ARTHROPLASTY Right 06/07/2017   Procedure: RIGHT TOTAL KNEE ARTHROPLASTY, INJECTION LEFT KNEE WITH FLUID ASPIRATION;  Surgeon: Jodi Geralds, MD;  [facility];  Service: Orthopedics;  Laterality: Right;  . TOTAL KNEE ARTHROPLASTY Left 08/23/2017   Procedure: LEFT TOTAL KNEE ARTHROPLASTY;  Surgeon: Jodi Geralds, MD;  [facility];  Service: Orthopedics;  Laterality: Left;    There were no vitals filed for this visit.  Subjective Assessment - 09/17/19 0853    Subjective  R hip has been bothering him off and on since knee surgery    Currently in Pain?  Yes    Pain Score  2     Pain Location  Knee    Pain Orientation  Right                       [facility] Adult PT Treatment/Exercise - 09/17/19 0001      Knee/Hip Exercises: Aerobic   Recumbent Bike  x4'     Nustep  L3 x5'      Knee/Hip Exercises: Machines for Strengthening   Cybex Knee Extension  10lb 2x10    Cybex Knee Flexion  25lb 2x10     Cybex Leg Press  60lb  2x15      Knee/Hip Exercises: Standing   Heel Raises  2 sets;Both;15 reps    Walking with Sports Cord  resisted gait 40lb 4 way x 3 each                  PT Long Term Goals - 09/10/19 1216      PT LONG TERM GOAL #1   Title  I with HEP to include a walking program ( 10/22/2019)      PT LONG TERM GOAL #2   Title  increase bilat hip strength =/> 5-/5 to allow him to transfer floor to stand I'ly ( 10/22/2019)      PT LONG TERM GOAL #3   Title  increase felxibility of bilat hamstrings to 90 degrees to decrease pain in hips and low back ( 10/22/2019)      PT LONG TERM GOAL #4   Title  walk on uneven surfaces / grass without loss of balance 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 He wishes to return to landscaping. He reports some R hip pain at times. He did well with all the machine level interventions. Some hip weakness, pain, and instability during resisted side step. Postural cues needed for heel raises.    Personal Factors and Comorbidities  Behavior Pattern;Past/Current Experience;Social Background    Examination-Activity Limitations  Squat;Other    Examination-Participation Restrictions  Community Activity;Yard Work;Other    Stability/Clinical Decision Making  Stable/Uncomplicated    Rehab Potential  Good    PT Frequency  2x / week    PT Duration  6 weeks    PT Treatment/Interventions  Gait training;Taping;Patient/family education;Functional mobility training;Moist Heat;Therapeutic activities;Therapeutic exercise;Cryotherapy;Electrical Stimulation;Manual techniques;Neuromuscular re-education;Dry needling    PT Next Visit Plan  strengthening and floor to stand transfers       Patient will benefit from skilled therapeutic intervention in order to improve the following deficits and impairments:  Difficulty walking, Pain, Decreased activity tolerance,  Decreased balance, Decreased strength  Visit Diagnosis: Difficulty in walking, not elsewhere classified  Muscle weakness (generalized)     Problem List Patient Active Problem List   Diagnosis Date Noted  . Primary osteoarthritis of right knee 06/07/2017  . Primary osteoarthritis of left knee 06/07/2017  . Effusion, left knee 06/07/2017    Scot Jun, PTA 09/17/2019, 9:27 AM  [place], [place], [address] Phone: [phone]   Fax:  [phone]  Name: LAURO MANLOVE MRN: [id number] Date of Birth: [birth year]

## 2019-09-22 ENCOUNTER — Encounter: Payer: Self-pay | Admitting: Physical Therapy

## 2019-09-22 ENCOUNTER — Ambulatory Visit: Payer: Medicare Other | Admitting: Physical Therapy

## 2019-09-22 ENCOUNTER — Other Ambulatory Visit: Payer: Self-pay

## 2019-09-22 DIAGNOSIS — R262 Difficulty in walking, not elsewhere classified: Secondary | ICD-10-CM

## 2019-09-22 DIAGNOSIS — R29898 Other symptoms and signs involving the musculoskeletal system: Secondary | ICD-10-CM

## 2019-09-22 DIAGNOSIS — M6281 Muscle weakness (generalized): Secondary | ICD-10-CM

## 2019-09-22 NOTE — Therapy (Signed)
[address] [place], [place], [address] Phone: [phone]   Fax:  [phone]  Physical Therapy Treatment  Patient Details  Name: Peter Roth MRN: [id number] Date of Birth: 05/17/1961 Referring Provider (PT): Dr Dorna Leitz   Encounter Date: 09/22/2019  PT End of Session - 09/22/19 1011    Visit Number  3    Number of Visits  12    Date for PT Re-Evaluation  10/22/19    Authorization Type  [employer] and M[employer] - submitted for initial 3 V    PT Start Time  0940    PT Stop Time  1015    PT Time Calculation (min)  35 min    Activity Tolerance  Patient tolerated treatment well    Behavior During Therapy  [facility] for tasks assessed/performed       Past Medical History:  Diagnosis Date  . Arthritis   . Bipolar 1 disorder ([place])   . Hyperlipidemia   . Hypertension   . Stress headaches   . Type 2 diabetes mellitus ([place])    followed by pcp  . Wears glasses     Past Surgical History:  Procedure Laterality Date  . KNEE ARTHROSCOPY Right ?date  . TOTAL KNEE ARTHROPLASTY Right 06/07/2017   Procedure: RIGHT TOTAL KNEE ARTHROPLASTY, INJECTION LEFT KNEE WITH FLUID ASPIRATION;  Surgeon: Dorna Leitz, MD;  Location: [facility];  Service: Orthopedics;  Laterality: Right;  . TOTAL KNEE ARTHROPLASTY Left 08/23/2017   Procedure: LEFT TOTAL KNEE ARTHROPLASTY;  Surgeon: Dorna Leitz, MD;  Location: [facility];  Service: Orthopedics;  Laterality: Left;    There were no vitals filed for this visit.  Subjective Assessment - 09/22/19 0941    Subjective  "Doing pretty good"    Currently in Pain?  Yes    Pain Location  Knee    Pain Orientation  Right    Pain Descriptors / Indicators  --   stiff                      [facility] Adult PT Treatment/Exercise - 09/22/19 0001      Knee/Hip Exercises: Aerobic   Recumbent Bike  x4'     Nustep  L3 x3'      Knee/Hip Exercises: Machines for Strengthening   Cybex Knee  Extension  10lb 2x10, RLE 5lb 2x10     Cybex Knee Flexion  25lb 2x15, RLE 15lb x10     Cybex Leg Press  60lb 2x15      Knee/Hip Exercises: Standing   Forward Step Up  Right;2 sets;10 reps;Hand Hold: 1;Step Height: 6"                  PT Long Term Goals - 09/10/19 1216      PT LONG TERM GOAL #1   Title  I with HEP to include a walking program ( 10/22/2019)      PT LONG TERM GOAL #2   Title  increase bilat hip strength =/> 5-/5 to allow him to transfer floor to stand I'ly ( 10/22/2019)      PT LONG TERM GOAL #3   Title  increase felxibility of bilat hamstrings to 90 degrees to decrease pain in hips and low back ( 10/22/2019)      PT LONG TERM GOAL #4   Title  walk on uneven surfaces / grass without loss of balance 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 Some initial knee stiffness reported on bike. He did well with the machine level interventions. Progressed to some SL strengthening exercises .Cues to complete full ROM.    Personal Factors and Comorbidities  Behavior Pattern;Past/Current Experience;Social Background    Examination-Activity Limitations  Squat;Other    Examination-Participation Restrictions  Community Activity;Yard Work;Other    Stability/Clinical Decision Making  Stable/Uncomplicated    Rehab Potential  Good    PT Frequency  2x / week    PT Treatment/Interventions  Gait training;Taping;Patient/family education;Functional mobility training;Moist Heat;Therapeutic activities;Therapeutic exercise;Cryotherapy;Electrical Stimulation;Manual techniques;Neuromuscular re-education;Dry needling    PT Next Visit Plan  strengthening and floor to stand transfers       Patient will benefit from skilled therapeutic intervention in order to improve the following deficits and impairments:  Difficulty walking, Pain, Decreased activity tolerance, Decreased balance,  Decreased strength  Visit Diagnosis: Other symptoms and signs involving the musculoskeletal system  Muscle weakness (generalized)  Difficulty in walking, not elsewhere classified     Problem List Patient Active Problem List   Diagnosis Date Noted  . Primary osteoarthritis of right knee 06/07/2017  . Primary osteoarthritis of left knee 06/07/2017  . Effusion, left knee 06/07/2017    Grayce Sessions, PTA 09/22/2019, 10:14 AM  [facility] [address] [facility] [address] [place], [place], [address] Phone: [phone]   Fax:  [phone]  Name: Peter Roth MRN: [id number] Date of Birth: April 21, 1961

## 2019-09-24 ENCOUNTER — Ambulatory Visit: Payer: Medicare Other | Admitting: Physical Therapy

## 2019-09-24 ENCOUNTER — Other Ambulatory Visit: Payer: Self-pay

## 2019-09-24 DIAGNOSIS — R262 Difficulty in walking, not elsewhere classified: Secondary | ICD-10-CM | POA: Diagnosis not present

## 2019-09-24 DIAGNOSIS — M25661 Stiffness of right knee, not elsewhere classified: Secondary | ICD-10-CM

## 2019-09-24 DIAGNOSIS — M25662 Stiffness of left knee, not elsewhere classified: Secondary | ICD-10-CM

## 2019-09-24 DIAGNOSIS — M6281 Muscle weakness (generalized): Secondary | ICD-10-CM

## 2019-09-24 NOTE — Therapy (Signed)
[address] [place], [place], [address] Phone: [phone]   Fax:  [phone]  Physical Therapy Treatment  Patient Details  Name: Peter Roth MRN: [id number] Date of Birth: 05/22/1961 Referring Provider (PT): Dr Dorna Leitz   Encounter Date: 09/24/2019  PT End of Session - 09/24/19 0815    Visit Number  4    Number of Visits  12    Date for PT Re-Evaluation  10/22/19    PT Start Time  0800    PT Stop Time  0845    PT Time Calculation (min)  45 min       Past Medical History:  Diagnosis Date  . Arthritis   . Bipolar 1 disorder ([place])   . Hyperlipidemia   . Hypertension   . Stress headaches   . Type 2 diabetes mellitus ([place])    followed by pcp  . Wears glasses     Past Surgical History:  Procedure Laterality Date  . KNEE ARTHROSCOPY Right ?date  . TOTAL KNEE ARTHROPLASTY Right 06/07/2017   Procedure: RIGHT TOTAL KNEE ARTHROPLASTY, INJECTION LEFT KNEE WITH FLUID ASPIRATION;  Surgeon: Dorna Leitz, MD;  [facility];  Service: Orthopedics;  Laterality: Right;  . TOTAL KNEE ARTHROPLASTY Left 08/23/2017   Procedure: LEFT TOTAL KNEE ARTHROPLASTY;  Surgeon: Dorna Leitz, MD;  [facility];  Service: Orthopedics;  Laterality: Left;    There were no vitals filed for this visit.  Subjective Assessment - 09/24/19 0758    Subjective  hurt last night waiting on rain, just stiff this morning    Currently in Pain?  Yes    Pain Score  2     Pain Location  Knee    Pain Orientation  Right;Left         [facility] PT Assessment - 09/24/19 0001      AROM   Right/Left Knee  Right;Left    Right Knee Extension  0    Right Knee Flexion  110    Left Knee Extension  0    Left Knee Flexion  100                   [facility] Adult PT Treatment/Exercise - 09/24/19 0001      Knee/Hip Exercises: Aerobic   Nustep  L 4 6 min LE only      Knee/Hip Exercises: Machines for Strengthening   Cybex  Leg Press  60lb 2x15      Knee/Hip Exercises: Standing   Other Standing Knee Exercises  hip 4 way with cable pulleys 10# BIL 15 each   cuing to stay upright. hip weakness esp abd     Knee/Hip Exercises: Supine   Bridges  Strengthening;15 reps   feet on ball   Bridges with [employer]  Strengthening;Both;20 reps    Other Supine Knee/Hip Exercises  clam shell 2 sets 15- green tband      Knee/Hip Exercises: Sidelying   Hip ABduction  Strengthening;Both;15 reps   red tband              PT Short Term Goals - 01/23/18 1556      PT SHORT TERM GOAL #1   Title  assure independent with HEP    Time  2    Period  Weeks    Status  Achieved        PT Long Term Goals - 09/24/19 8502      PT LONG  TERM GOAL #1   Title  I with HEP to include a walking program ( 10/22/2019)    Status  Partially Met      PT LONG TERM GOAL #2   Title  increase bilat hip strength =/> 5-/5 to allow him to transfer floor to stand I'ly ( 10/22/2019)    Status  Partially Met      PT LONG TERM GOAL #3   Title  increase felxibility of bilat hamstrings to 90 degrees to decrease pain in hips and low back ( 10/22/2019)    Baseline  about 80 BIL    Status  Partially Met      PT LONG TERM GOAL #4   Title  walk on uneven surfaces / grass without loss of balance 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 ROM for flexion same as eval, full BIL ext. noted in session today hips are very weak and some pain ( rest required) and needed cuing for posture and BM with ex    PT Treatment/Interventions  Gait training;Taping;Patient/family education;Functional mobility training;Moist Heat;Therapeutic activities;Therapeutic exercise;Cryotherapy;Electrical Stimulation;Manual techniques;Neuromuscular re-education;Dry needling    PT Next Visit  Plan  strengthening and floor to stand transfers       Patient will benefit from skilled therapeutic intervention in order to improve the following deficits and impairments:  Difficulty walking, Pain, Decreased activity tolerance, Decreased balance, Decreased strength  Visit Diagnosis: Muscle weakness (generalized)  Difficulty in walking, not elsewhere classified  Stiffness of left knee, not elsewhere classified  Stiffness of right knee, not elsewhere classified     Problem List Patient Active Problem List   Diagnosis Date Noted  . Primary osteoarthritis of right knee 06/07/2017  . Primary osteoarthritis of left knee 06/07/2017  . Effusion, left knee 06/07/2017    Drena Ham,ANGIE PTA 09/24/2019, 8:32 AM  [address] [place], [place], [address] Phone: [phone]   Fax:  [phone]  Name: Peter Roth MRN: [id number] Date of Birth: 1961-02-28

## 2019-09-29 ENCOUNTER — Ambulatory Visit: Payer: Medicare Other | Admitting: Physical Therapy

## 2019-09-29 ENCOUNTER — Other Ambulatory Visit: Payer: Self-pay

## 2019-09-29 DIAGNOSIS — M25661 Stiffness of right knee, not elsewhere classified: Secondary | ICD-10-CM

## 2019-09-29 DIAGNOSIS — M25662 Stiffness of left knee, not elsewhere classified: Secondary | ICD-10-CM

## 2019-09-29 DIAGNOSIS — R262 Difficulty in walking, not elsewhere classified: Secondary | ICD-10-CM | POA: Diagnosis not present

## 2019-09-29 DIAGNOSIS — M6281 Muscle weakness (generalized): Secondary | ICD-10-CM

## 2019-09-29 NOTE — Therapy (Signed)
[address] [place], [place], [address] Phone: [phone]   Fax:  [phone]  Physical Therapy Treatment  Patient Details  Name: Peter Roth MRN: [id number] Date of Birth: 1961/02/26 Referring Provider (PT): Dr Dorna Leitz   Encounter Date: 09/29/2019  PT End of Session - 09/29/19 0922    Visit Number  5    Number of Visits  12    Date for PT Re-Evaluation  10/22/19    PT Start Time  0850    PT Stop Time  0930    PT Time Calculation (min)  40 min       Past Medical History:  Diagnosis Date  . Arthritis   . Bipolar 1 disorder ([place])   . Hyperlipidemia   . Hypertension   . Stress headaches   . Type 2 diabetes mellitus ([place])    followed by pcp  . Wears glasses     Past Surgical History:  Procedure Laterality Date  . KNEE ARTHROSCOPY Right ?date  . TOTAL KNEE ARTHROPLASTY Right 06/07/2017   Procedure: RIGHT TOTAL KNEE ARTHROPLASTY, INJECTION LEFT KNEE WITH FLUID ASPIRATION;  Surgeon: Dorna Leitz, MD;  [facility];  Service: Orthopedics;  Laterality: Right;  . TOTAL KNEE ARTHROPLASTY Left 08/23/2017   Procedure: LEFT TOTAL KNEE ARTHROPLASTY;  Surgeon: Dorna Leitz, MD;  [facility];  Service: Orthopedics;  Laterality: Left;    There were no vitals filed for this visit.  Subjective Assessment - 09/29/19 0852    Subjective  "worked me hard last time-but I need it" knee stay stiff all the time    Currently in Pain?  Yes    Pain Score  3     Pain Location  Knee    Pain Orientation  Right;Left                       [facility] Adult PT Treatment/Exercise - 09/29/19 0001      Knee/Hip Exercises: Aerobic   Elliptical  2 min fwd/2 back I 10 R 5    Tread Mill  1.5 min side step each way    Nustep  L 5 5 min LE only      Knee/Hip Exercises: Machines for Strengthening   Cybex Knee Extension  10lb 2x10, 10# SL 10 each    Cybex Knee Flexion  25lb 2x15, 10 times 20# SL     Cybex Leg Press  60lb 2x15      Knee/Hip Exercises: Standing   Other Standing Knee Exercises  stepping over roll 5# ankle wt fwd and back 15 BIL then laterally 15 x    Other Standing Knee Exercises  8 inch step tap 15 BIL 5#               PT Short Term Goals - 01/23/18 1556      PT SHORT TERM GOAL #1   Title  assure independent with HEP    Time  2    Period  Weeks    Status  Achieved        PT Long Term Goals - 09/24/19 1660      PT LONG TERM GOAL #1   Title  I with HEP to include a walking program ( 10/22/2019)    Status  Partially Met      PT LONG TERM GOAL #2   Title  increase bilat hip strength =/> 5-/5 to allow him to transfer  floor to stand I'ly ( 10/22/2019)    Status  Partially Met      PT LONG TERM GOAL #3   Title  increase felxibility of bilat hamstrings to 90 degrees to decrease pain in hips and low back ( 10/22/2019)    Baseline  about 80 BIL    Status  Partially Met      PT LONG TERM GOAL #4   Title  walk on uneven surfaces / grass without loss of 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 cuing needed to avoid compensation    PT Treatment/Interventions  Gait training;Taping;Patient/family education;Functional mobility training;Moist Heat;Therapeutic activities;Therapeutic exercise;Cryotherapy;Electrical Stimulation;Manual techniques;Neuromuscular re-education;Dry needling    PT Next Visit Plan  strengthening and floor to stand transfers.check goals       Patient will benefit from skilled therapeutic intervention in order to improve the following deficits and impairments:  Difficulty walking, Pain, Decreased activity tolerance, Decreased balance, Decreased strength  Visit Diagnosis: Muscle weakness (generalized)  Difficulty in walking, not elsewhere classified  Stiffness of left  knee, not elsewhere classified  Stiffness of right knee, not elsewhere classified     Problem List Patient Active Problem List   Diagnosis Date Noted  . Primary osteoarthritis of right knee 06/07/2017  . Primary osteoarthritis of left knee 06/07/2017  . Effusion, left knee 06/07/2017    ,ANGIE PTA 09/29/2019, 9:25 AM  [address] [place], [place], [address] Phone: [phone]   Fax:  [phone]  Name: Peter Roth MRN: [id number] Date of Birth: 1960/12/27

## 2019-10-01 ENCOUNTER — Ambulatory Visit: Payer: Medicare Other | Admitting: Physical Therapy

## 2019-10-01 ENCOUNTER — Other Ambulatory Visit: Payer: Self-pay

## 2019-10-01 DIAGNOSIS — R262 Difficulty in walking, not elsewhere classified: Secondary | ICD-10-CM

## 2019-10-01 DIAGNOSIS — M25661 Stiffness of right knee, not elsewhere classified: Secondary | ICD-10-CM

## 2019-10-01 DIAGNOSIS — M25662 Stiffness of left knee, not elsewhere classified: Secondary | ICD-10-CM

## 2019-10-01 DIAGNOSIS — M6281 Muscle weakness (generalized): Secondary | ICD-10-CM

## 2019-10-01 NOTE — Therapy (Signed)
[place] [place], [place], [address] Phone: [phone]   Fax:  [phone]  Physical Therapy Treatment  Patient Details  Name: Peter Roth MRN: [id number] Date of Birth: 05/24/1961 Referring Provider (PT): Dr Dorna Leitz   Encounter Date: 10/01/2019  PT End of Session - 10/01/19 0825    Visit Number  6    Number of Visits  12    Date for PT Re-Evaluation  10/22/19    Authorization Type  [employer] and MCD - submitted for initial 3 V    PT Start Time  0751    PT Stop Time  0836    PT Time Calculation (min)  45 min       Past Medical History:  Diagnosis Date  . Arthritis   . Bipolar 1 disorder ([place])   . Hyperlipidemia   . Hypertension   . Stress headaches   . Type 2 diabetes mellitus ([place])    followed by pcp  . Wears glasses     Past Surgical History:  Procedure Laterality Date  . KNEE ARTHROSCOPY Right ?date  . TOTAL KNEE ARTHROPLASTY Right 06/07/2017   Procedure: RIGHT TOTAL KNEE ARTHROPLASTY, INJECTION LEFT KNEE WITH FLUID ASPIRATION;  Surgeon: Dorna Leitz, MD;  [facility];  Service: Orthopedics;  Laterality: Right;  . TOTAL KNEE ARTHROPLASTY Left 08/23/2017   Procedure: LEFT TOTAL KNEE ARTHROPLASTY;  Surgeon: Dorna Leitz, MD;  [facility];  Service: Orthopedics;  Laterality: Left;    There were no vitals filed for this visit.  Subjective Assessment - 10/01/19 0755    Subjective  "stay stiff all the time" " able to do more but hurt after"    Currently in Pain?  Yes    Pain Score  3     Pain Location  Knee    Pain Orientation  Right;Left         [facility] PT Assessment - 10/01/19 0001      AROM   Right/Left Knee  Right;Left    Right Knee Extension  0    Right Knee Flexion  110    Left Knee Extension  0    Left Knee Flexion  105                   [facility] Adult PT Treatment/Exercise - 10/01/19 0001      Ambulation/Gait   Stairs  Yes    Stairs Assistance   6: Modified independent (Device/Increase time)    Stair Management Technique  Alternating pattern    Number of Stairs  20    Gait Comments  pt c/o tightness at end range motion      Knee/Hip Exercises: Aerobic   Elliptical  2 min fwd/2 back I 10 R 5    Tread Mill  1.5 min side step each way   cuing needed and SBA,difficult to keep feet fwd   Recumbent Bike  6 min   no resistsance for ROM     Knee/Hip Exercises: Machines for Strengthening   Cybex Knee Extension  20 # 2 sets 10    Cybex Knee Flexion  35# 2 sets 15    Cybex Leg Press  70# 2x15   seat on #5     Knee/Hip Exercises: Seated   Sit to Sand  15 reps;without UE support   wt ball from mat     Manual Therapy   Passive ROM  knee flex,quad and hip flex  in prone               PT Short Term Goals - 01/23/18 1556      PT SHORT TERM GOAL #1   Title  assure independent with HEP    Time  2    Period  Weeks    Status  Achieved        PT Long Term Goals - 10/01/19 1275      PT LONG TERM GOAL #1   Title  I with HEP to include a walking program ( 10/22/2019)    Status  Partially Met      PT LONG TERM GOAL #2   Title  increase bilat hip strength =/> 5-/5 to allow him to transfer floor to stand I'ly ( 10/22/2019)    Status  Partially Met      PT LONG TERM GOAL #3   Title  increase felxibility of bilat hamstrings to 90 degrees to decrease pain in hips and low back ( 10/22/2019)    Status  Partially Met      PT LONG TERM GOAL #4   Title  walk on uneven surfaces / 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 aggressive PROM and stetching as tightnes sis his cheif complaint. cuing with ex for correct tech esp as he fatigues. pt does fatigue quickly with ex.    PT Treatment/Interventions  Gait training;Taping;Patient/family  education;Functional mobility training;Moist Heat;Therapeutic activities;Therapeutic exercise;Cryotherapy;Electrical Stimulation;Manual techniques;Neuromuscular re-education;Dry needling    PT Next Visit Plan  strength/endurance/ROM       Patient will benefit from skilled therapeutic intervention in order to improve the following deficits and impairments:  Difficulty walking, Pain, Decreased activity tolerance, Decreased balance, Decreased strength  Visit Diagnosis: Muscle weakness (generalized)  Difficulty in walking, not elsewhere classified  Stiffness of left knee, not elsewhere classified  Stiffness of right knee, not elsewhere classified     Problem List Patient Active Problem List   Diagnosis Date Noted  . Primary osteoarthritis of right knee 06/07/2017  . Primary osteoarthritis of left knee 06/07/2017  . Effusion, left knee 06/07/2017    Joaquim Tolen,ANGIE PTA 10/01/2019, 8:28 AM  [address] [place], [place], [address] Phone: [phone]   Fax:  [phone]  Name: JERL MUNYAN MRN: [id number] Date of Birth: 07/17/1960

## 2019-10-06 ENCOUNTER — Ambulatory Visit: Payer: Medicare Other | Admitting: Physical Therapy

## 2019-10-06 ENCOUNTER — Other Ambulatory Visit: Payer: Self-pay

## 2019-10-06 DIAGNOSIS — M25662 Stiffness of left knee, not elsewhere classified: Secondary | ICD-10-CM

## 2019-10-06 DIAGNOSIS — M6281 Muscle weakness (generalized): Secondary | ICD-10-CM

## 2019-10-06 DIAGNOSIS — R262 Difficulty in walking, not elsewhere classified: Secondary | ICD-10-CM | POA: Diagnosis not present

## 2019-10-06 DIAGNOSIS — M25661 Stiffness of right knee, not elsewhere classified: Secondary | ICD-10-CM

## 2019-10-06 NOTE — Therapy (Signed)
[address] [place], [place], [address] Phone: [phone]   Fax:  [phone]  Physical Therapy Treatment  Patient Details  Name: Peter Roth MRN: [id number] Date of Birth: 1961/03/21 Referring Provider (PT): Dr Dorna Leitz   Encounter Date: 10/06/2019  PT End of Session - 10/06/19 0839    Visit Number  7    Date for PT Re-Evaluation  10/22/19    PT Start Time  0806    PT Stop Time  0855    PT Time Calculation (min)  49 min       Past Medical History:  Diagnosis Date  . Arthritis   . Bipolar 1 disorder ([place])   . Hyperlipidemia   . Hypertension   . Stress headaches   . Type 2 diabetes mellitus ([place])    followed by pcp  . Wears glasses     Past Surgical History:  Procedure Laterality Date  . KNEE ARTHROSCOPY Right ?date  . TOTAL KNEE ARTHROPLASTY Right 06/07/2017   Procedure: RIGHT TOTAL KNEE ARTHROPLASTY, INJECTION LEFT KNEE WITH FLUID ASPIRATION;  Surgeon: Dorna Leitz, MD;  [facility];  Service: Orthopedics;  Laterality: Right;  . TOTAL KNEE ARTHROPLASTY Left 08/23/2017   Procedure: LEFT TOTAL KNEE ARTHROPLASTY;  Surgeon: Dorna Leitz, MD;  [facility];  Service: Orthopedics;  Laterality: Left;    There were no vitals filed for this visit.  Subjective Assessment - 10/06/19 0807    Subjective  alittle stiff    Currently in Pain?  Yes    Pain Location  Knee    Pain Orientation  Right;Left                       [facility] Adult PT Treatment/Exercise - 10/06/19 0001      Transfers   Transfers  Floor to Transfer    Floor to Transfer  6: Modified independent (Device/Increase time);With upper extremity assist   with cuing   Number of Reps  Other reps (comment)   3 sets 5 using mat, various stances     Ambulation/Gait   Gait Comments  gait outside all surfaces      High Level Balance   High Level Balance Activities  Side stepping;Marching forwards   red  tband SBA for hip strength     Knee/Hip Exercises: Aerobic   Elliptical  2 min fwd/2 back I 10 R 5    Nustep  L 5 6 min LE only      Knee/Hip Exercises: Machines for Strengthening   Cybex Knee Extension  20# 3 sets 10    Cybex Knee Flexion  35# 2 sets 15    Cybex Leg Press  70# 2x15               PT Short Term Goals - 01/23/18 1556      PT SHORT TERM GOAL #1   Title  assure independent with HEP    Time  2    Period  Weeks    Status  Achieved        PT Long Term Goals - 10/06/19 0820      PT LONG TERM GOAL #1   Title  I with HEP to include a walking program ( 10/22/2019)    Status  Partially Met      PT LONG TERM GOAL #2   Title  increase bilat hip strength =/> 5-/5 to allow him to transfer  floor to stand I'ly ( 10/22/2019)    Status  Partially Met      PT LONG TERM GOAL #3   Title  increase felxibility of bilat hamstrings to 90 degrees to decrease pain in hips and low back ( 10/22/2019)    Status  Partially Met      PT LONG TERM GOAL #4   Title  walk on uneven surfaces / 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 pt did well with floor transfers but needed UE- will need ot progress to no UE use. fatigued with ex and hip weakness is still present. progressing with goals.    PT Treatment/Interventions  Gait training;Taping;Patient/family education;Functional mobility training;Moist Heat;Therapeutic activities;Therapeutic exercise;Cryotherapy;Electrical Stimulation;Manual techniques;Neuromuscular re-education;Dry needling    PT Next Visit Plan  strength/endurance/ROM       Patient will benefit from skilled therapeutic intervention in order to improve the following deficits and impairments:  Difficulty walking,  Pain, Decreased activity tolerance, Decreased balance, Decreased strength  Visit Diagnosis: Muscle weakness (generalized)  Difficulty in walking, not elsewhere classified  Stiffness of right knee, not elsewhere classified  Stiffness of left knee, not elsewhere classified     Problem List Patient Active Problem List   Diagnosis Date Noted  . Primary osteoarthritis of right knee 06/07/2017  . Primary osteoarthritis of left knee 06/07/2017  . Effusion, left knee 06/07/2017    Peter Roth,ANGIE PTA 10/06/2019, 8:55 AM  [address] [place], [place], [address] Phone: [phone]   Fax:  [phone]  Name: Peter Roth MRN: [id number] Date of Birth: [birth year]

## 2019-10-13 ENCOUNTER — Other Ambulatory Visit: Payer: Self-pay

## 2019-10-13 ENCOUNTER — Ambulatory Visit: Payer: Medicare Other | Attending: Orthopedic Surgery | Admitting: Physical Therapy

## 2019-10-13 DIAGNOSIS — R600 Localized edema: Secondary | ICD-10-CM | POA: Insufficient documentation

## 2019-10-13 DIAGNOSIS — M25561 Pain in right knee: Secondary | ICD-10-CM | POA: Insufficient documentation

## 2019-10-13 DIAGNOSIS — R29898 Other symptoms and signs involving the musculoskeletal system: Secondary | ICD-10-CM | POA: Diagnosis present

## 2019-10-13 DIAGNOSIS — M25661 Stiffness of right knee, not elsewhere classified: Secondary | ICD-10-CM | POA: Diagnosis present

## 2019-10-13 DIAGNOSIS — M6281 Muscle weakness (generalized): Secondary | ICD-10-CM | POA: Insufficient documentation

## 2019-10-13 DIAGNOSIS — R262 Difficulty in walking, not elsewhere classified: Secondary | ICD-10-CM | POA: Insufficient documentation

## 2019-10-13 DIAGNOSIS — M25562 Pain in left knee: Secondary | ICD-10-CM | POA: Diagnosis present

## 2019-10-13 DIAGNOSIS — M25662 Stiffness of left knee, not elsewhere classified: Secondary | ICD-10-CM | POA: Insufficient documentation

## 2019-10-13 NOTE — Therapy (Signed)
[address] [place], [place], [address] Phone: [phone]   Fax:  [phone]  Physical Therapy Treatment  Patient Details  Name: Peter Roth MRN: [id number] Date of Birth: [birth year] Referring Provider (PT): Dr Dorna Leitz   Encounter Date: 10/13/2019  PT End of Session - 10/13/19 0920    Visit Number  8    Date for PT Re-Evaluation  10/22/19    PT Start Time  0834    PT Stop Time  0922    PT Time Calculation (min)  48 min       Past Medical History:  Diagnosis Date  . Arthritis   . Bipolar 1 disorder ([place])   . Hyperlipidemia   . Hypertension   . Stress headaches   . Type 2 diabetes mellitus ([place])    followed by pcp  . Wears glasses     Past Surgical History:  Procedure Laterality Date  . KNEE ARTHROSCOPY Right ?date  . TOTAL KNEE ARTHROPLASTY Right 06/07/2017   Procedure: RIGHT TOTAL KNEE ARTHROPLASTY, INJECTION LEFT KNEE WITH FLUID ASPIRATION;  Surgeon: Dorna Leitz, MD;  Location: [facility];  Service: Orthopedics;  Laterality: Right;  . TOTAL KNEE ARTHROPLASTY Left 08/23/2017   Procedure: LEFT TOTAL KNEE ARTHROPLASTY;  Surgeon: Dorna Leitz, MD;  Location[facility];  Service: Orthopedics;  Laterality: Left;    There were no vitals filed for this visit.  Subjective Assessment - 10/13/19 0838    Subjective  alttle stiff    Currently in Pain?  Yes    Pain Score  2     Pain Location  Knee    Pain Orientation  Right         [facility] PT Assessment - 10/13/19 0001      AROM   Right Knee Flexion  110    Left Knee Flexion  100                   [facility] Adult PT Treatment/Exercise - 10/13/19 0001      Knee/Hip Exercises: Aerobic   Tread Mill  1.5 min side step each way   CGA with cuing   Nustep  L 6 7 min LE only      Knee/Hip Exercises: Machines for Strengthening   Cybex Leg Press  70# 2x15   seat #5     Knee/Hip Exercises: Standing   Wall Squat  10 seconds;5 reps     Walking with Sports Cord  resited gait 4 way in hall    Other Standing Knee Exercises  wt ball slams with squat 10 x    Other Standing Knee Exercises  10# dead lift 2 sets 10      Manual Therapy   Passive ROM  knee flex/ext with quad STW               PT Short Term Goals - 01/23/18 1556      PT SHORT TERM GOAL #1   Title  assure independent with HEP    Time  2    Period  Weeks    Status  Achieved        PT Long Term Goals - 10/06/19 0820      PT LONG TERM GOAL #1   Title  I with HEP to include a walking program ( 10/22/2019)    Status  Partially Met      PT LONG TERM GOAL #2   Title  increase  bilat hip strength =/> 5-/5 to allow him to transfer floor to stand I'ly ( 10/22/2019)    Status  Partially Met      PT LONG TERM GOAL #3   Title  increase felxibility of bilat hamstrings to 90 degrees to decrease pain in hips and low back ( 10/22/2019)    Status  Partially Met      PT LONG TERM GOAL #4   Title  walk on uneven surfaces / grass without loss of 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 pt continues to need cuing with ex for correct tech and some guarding. pt has decreased ROM with func ex is lunges and squats.    PT Treatment/Interventions  Gait training;Taping;Patient/family education;Functional mobility training;Moist Heat;Therapeutic activities;Therapeutic exercise;Cryotherapy;Electrical Stimulation;Manual techniques;Neuromuscular re-education;Dry needling    PT Next Visit Plan  strength/endurance/ROM       Patient will benefit from skilled therapeutic intervention in order to improve the following deficits and impairments:  Difficulty walking, Pain, Decreased activity tolerance, Decreased balance, Decreased strength  Visit Diagnosis: Muscle weakness  (generalized)  Difficulty in walking, not elsewhere classified  Stiffness of right knee, not elsewhere classified  Stiffness of left knee, not elsewhere classified     Problem List Patient Active Problem List   Diagnosis Date Noted  . Primary osteoarthritis of right knee 06/07/2017  . Primary osteoarthritis of left knee 06/07/2017  . Effusion, left knee 06/07/2017    K[place]undra Hair,ANGIE PTA 10/13/2019, 9:26 AM  [address] [place], [place], [address] Phone: [phone]   Fax:  [phone]  Name: Peter Roth MRN: [id number] Date of Birth: [birth year]

## 2019-10-15 ENCOUNTER — Ambulatory Visit: Payer: Medicare Other | Admitting: Physical Therapy

## 2019-10-15 ENCOUNTER — Other Ambulatory Visit: Payer: Self-pay

## 2019-10-15 DIAGNOSIS — M6281 Muscle weakness (generalized): Secondary | ICD-10-CM

## 2019-10-15 DIAGNOSIS — R262 Difficulty in walking, not elsewhere classified: Secondary | ICD-10-CM

## 2019-10-15 DIAGNOSIS — M25661 Stiffness of right knee, not elsewhere classified: Secondary | ICD-10-CM

## 2019-10-15 DIAGNOSIS — M25662 Stiffness of left knee, not elsewhere classified: Secondary | ICD-10-CM

## 2019-10-15 NOTE — Therapy (Signed)
[address] [place], [place], [address] Phone: [phone]   Fax:  [phone]  Physical Therapy Treatment  Patient Details  Name: Peter Roth MRN: [id number] Date of Birth: 09/02/1960 Referring Provider (PT): Dr Dorna Leitz   Encounter Date: 10/15/2019  PT End of Session - 10/15/19 0833    Visit Number  9    Date for PT Re-Evaluation  10/22/19    PT Start Time  0800    PT Stop Time  0845    PT Time Calculation (min)  45 min       Past Medical History:  Diagnosis Date  . Arthritis   . Bipolar 1 disorder ([place])   . Hyperlipidemia   . Hypertension   . Stress headaches   . Type 2 diabetes mellitus ([place])    followed by pcp  . Wears glasses     Past Surgical History:  Procedure Laterality Date  . KNEE ARTHROSCOPY Right ?date  . TOTAL KNEE ARTHROPLASTY Right 06/07/2017   Procedure: RIGHT TOTAL KNEE ARTHROPLASTY, INJECTION LEFT KNEE WITH FLUID ASPIRATION;  Surgeon: Dorna Leitz, MD;  [facility];  Service: Orthopedics;  Laterality: Right;  . TOTAL KNEE ARTHROPLASTY Left 08/23/2017   Procedure: LEFT TOTAL KNEE ARTHROPLASTY;  Surgeon: Dorna Leitz, MD;  [facility];  Service: Orthopedics;  Laterality: Left;    There were no vitals filed for this visit.  Subjective Assessment - 10/15/19 0803    Subjective  "just stiff" " not really feeling any changes"    Currently in Pain?  Yes    Pain Score  2     Pain Location  Knee    Pain Orientation  Right;Left                       [facility] Adult PT Treatment/Exercise - 10/15/19 0001      Knee/Hip Exercises: Aerobic   Elliptical  3 min fwd/3 back I 10 R 6   very fatigued   Recumbent Bike  5 min    full rev but compensation noted   Nustep  L 6 6 min LE only      Knee/Hip Exercises: Machines for Strengthening   Cybex Knee Extension  20# 3 sets 10    Cybex Knee Flexion  35# 2 sets 15    Cybex Leg Press  70# 2x15   seat #5      Knee/Hip Exercises: Seated   Sit to Sand  20 reps   10 reps 16 inch, 10 reps 14 inches min A              PT Short Term Goals - 01/23/18 1556      PT SHORT TERM GOAL #1   Title  assure independent with HEP    Time  2    Period  Weeks    Status  Achieved        PT Long Term Goals - 10/15/19 0831      PT LONG TERM GOAL #1   Title  I with HEP to include a walking program ( 10/22/2019)    Baseline  pt reports walking some but not consistant    Status  Partially Met      PT LONG TERM GOAL #2   Title  increase bilat hip strength =/> 5-/5 to allow him to transfer floor to stand I'ly ( 10/22/2019)    Baseline  needs assistance or UE  support to get up and down from floor    Status  Partially Met      PT LONG TERM GOAL #3   Title  increase felxibility of bilat hamstrings to 90 degrees to decrease pain in hips and low back ( 10/22/2019)    Baseline  about 80 BIL    Status  Partially Met      PT LONG TERM GOAL #4   Title  walk on uneven surfaces / grass without loss of balance 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 pt still with limited flexion RT> Left so limited with floor transfers and STS from low surface to allow for ADLs and lawn care services. Pt needed min A from 14 inch and compensated to get full rev on bike. pt is alos deconditioned as noted by cardio stamina    PT Treatment/Interventions  Gait training;Taping;Patient/family education;Functional mobility training;Moist Heat;Therapeutic activities;Therapeutic exercise;Cryotherapy;Electrical Stimulation;Manual techniques;Neuromuscular re-education;Dry needling    PT Next Visit Plan  strength/endurance/ROM       Patient will benefit from skilled therapeutic intervention in order to improve the  following deficits and impairments:  Difficulty walking, Pain, Decreased activity tolerance, Decreased balance, Decreased strength  Visit Diagnosis: Difficulty in walking, not elsewhere classified  Muscle weakness (generalized)  Stiffness of right knee, not elsewhere classified  Stiffness of left knee, not elsewhere classified     Problem List Patient Active Problem List   Diagnosis Date Noted  . Primary osteoarthritis of right knee 06/07/2017  . Primary osteoarthritis of left knee 06/07/2017  . Effusion, left knee 06/07/2017    Waylan Busta,ANGIE PTA 10/15/2019, 8:35 AM  [address] [place], [place], [address] Phone: [phone]   Fax:  [phone]  Name: Peter Roth MRN: [id number] Date of Birth: Feb 10, 1961

## 2019-10-20 ENCOUNTER — Ambulatory Visit: Payer: Medicare Other | Admitting: Physical Therapy

## 2019-10-22 ENCOUNTER — Ambulatory Visit: Payer: Medicare Other | Admitting: Physical Therapy

## 2019-10-27 ENCOUNTER — Ambulatory Visit: Payer: Medicare Other | Admitting: Physical Therapy

## 2019-10-29 ENCOUNTER — Other Ambulatory Visit: Payer: Self-pay

## 2019-10-29 ENCOUNTER — Ambulatory Visit: Payer: Medicare Other | Admitting: Physical Therapy

## 2019-10-29 DIAGNOSIS — R29898 Other symptoms and signs involving the musculoskeletal system: Secondary | ICD-10-CM

## 2019-10-29 DIAGNOSIS — M25561 Pain in right knee: Secondary | ICD-10-CM

## 2019-10-29 DIAGNOSIS — M25661 Stiffness of right knee, not elsewhere classified: Secondary | ICD-10-CM

## 2019-10-29 DIAGNOSIS — M25562 Pain in left knee: Secondary | ICD-10-CM

## 2019-10-29 DIAGNOSIS — M6281 Muscle weakness (generalized): Secondary | ICD-10-CM | POA: Diagnosis not present

## 2019-10-29 DIAGNOSIS — R262 Difficulty in walking, not elsewhere classified: Secondary | ICD-10-CM

## 2019-10-29 DIAGNOSIS — R600 Localized edema: Secondary | ICD-10-CM

## 2019-10-29 DIAGNOSIS — M25662 Stiffness of left knee, not elsewhere classified: Secondary | ICD-10-CM

## 2019-10-29 NOTE — Therapy (Signed)
[address] [place], [place], [address] Phone: [phone]   Fax:  [phone] Progress Note Reporting Period 09/10/19 to 10/29/19 for the first 10 visits  See note below for Objective Data and Assessment of Progress/Goals.      Physical Therapy Treatment  Patient Details  Name: Peter Roth MRN: [id number] Date of Birth: Sep 29, 1960 Referring Provider (PT): Dr Dorna Leitz   Encounter Date: 10/29/2019  PT End of Session - 10/29/19 0849    Visit Number  10    Number of Visits  12    Date for PT Re-Evaluation  10/22/19    PT Start Time  0800    PT Stop Time  0845    PT Time Calculation (min)  45 min    Activity Tolerance  Patient tolerated treatment well    Behavior During Therapy  [facility] for tasks assessed/performed       Past Medical History:  Diagnosis Date  . Arthritis   . Bipolar 1 disorder ([place])   . Hyperlipidemia   . Hypertension   . Stress headaches   . Type 2 diabetes mellitus ([place])    followed by pcp  . Wears glasses     Past Surgical History:  Procedure Laterality Date  . KNEE ARTHROSCOPY Right ?date  . TOTAL KNEE ARTHROPLASTY Right 06/07/2017   Procedure: RIGHT TOTAL KNEE ARTHROPLASTY, INJECTION LEFT KNEE WITH FLUID ASPIRATION;  Surgeon: Dorna Leitz, MD;  [facility];  Service: Orthopedics;  Laterality: Right;  . TOTAL KNEE ARTHROPLASTY Left 08/23/2017   Procedure: LEFT TOTAL KNEE ARTHROPLASTY;  Surgeon: Dorna Leitz, MD;  [facility];  Service: Orthopedics;  Laterality: Left;    There were no vitals filed for this visit.  Subjective Assessment - 10/29/19 0756    Subjective  "knees are still stiff", pt hasn't been here in a week due to family issues.    Currently in Pain?  No/denies    Pain Score  0-No pain         [facility] PT Assessment - 10/29/19 0001      AROM   Right Knee Flexion  105    Left Knee Flexion  109                   [facility] Adult PT  Treatment/Exercise - 10/29/19 0001      Knee/Hip Exercises: Aerobic   Elliptical  I 10, R 6 6 min    Nustep  L 6 6 min LE only      Knee/Hip Exercises: Machines for Strengthening   Cybex Knee Extension  20# 2x10    Cybex Knee Flexion  35# 2x10    Cybex Leg Press  60#, 2x10      Knee/Hip Exercises: Standing   Forward Lunges  Both;2 sets;10 reps   touch knee on BOSU with verb and tactile cuing   Other Standing Knee Exercises  squats 3#, 2x10    Other Standing Knee Exercises  3# deadlift 3x10      Knee/Hip Exercises: Seated   Sit to Sand  --   attempted from treadmill much dificulty                 PT Long Term Goals - 10/29/19 0856      PT LONG TERM GOAL #1   Title  I with HEP to include a walking program ( 10/22/2019)    Status  Partially Met  PT LONG TERM GOAL #2   Title  increase bilat hip strength =/> 5-/5 to allow him to transfer floor to stand I'ly ( 10/22/2019)    Status  Partially Met      PT LONG TERM GOAL #3   Title  increase felxibility of bilat hamstrings to 90 degrees to decrease pain in hips and low back ( 10/22/2019)    Baseline  about 80 BIL    Time  6    Period  Weeks    Status  Partially Met      PT LONG TERM GOAL #4   Title  walk on uneven surfaces / grass without loss of balance 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)    Status  Partially Met            Plan - 10/29/19 0850    Clinical Impression Statement  The patient became fatigued while doing the fwd lounges onto the bosu compensation noted w/ leaning to the opposite side, limitations are mostly in the right knee with bending down, sit to stands were attempted but too difficult d/t knee stiffness       Patient will benefit from skilled therapeutic intervention in order to improve the following deficits and impairments:  Difficulty walking, Pain, Decreased activity tolerance, Decreased balance, Decreased strength  Visit Diagnosis: Difficulty in walking, not  elsewhere classified  Muscle weakness (generalized)  Stiffness of right knee, not elsewhere classified  Other symptoms and signs involving the musculoskeletal system  Acute pain of left knee  Acute pain of right knee  Localized edema  Stiffness of left knee, not elsewhere classified     Problem List Patient Active Problem List   Diagnosis Date Noted  . Primary osteoarthritis of right knee 06/07/2017  . Primary osteoarthritis of left knee 06/07/2017  . Effusion, left knee 06/07/2017   Clark,Braylin SPTA PAYSEUR,ANGIE PTA 10/29/2019, 9:57 AM  [facility] [address] [place], [place], [address] Phone: [phone]   Fax:  [phone]  Name: Peter Roth MRN: [id number] Date of Birth: 01/28/1961   

## 2019-10-29 NOTE — Therapy (Signed)
[facility] [place] [facility] [address] [place], [place], [address] Phone: [phone]   Fax:  [phone]  Physical Therapy Treatment  Patient Details  Name: Peter Roth MRN: [id number] Date of Birth: 1961-01-31 Referring Provider (PT): Dr Jodi Geralds   Encounter Date: 10/29/2019  PT End of Session - 10/29/19 0849    Visit Number  10    Number of Visits  12    Date for PT Re-Evaluation  10/22/19    PT Start Time  0800    PT Stop Time  0845    PT Time Calculation (min)  45 min    Activity Tolerance  Patient tolerated treatment well    Behavior During Therapy  [facility] for tasks assessed/performed       Past Medical History:  Diagnosis Date  . Arthritis   . Bipolar 1 disorder ([facility])   . Hyperlipidemia   . Hypertension   . Stress headaches   . Type 2 diabetes mellitus ([facility])    followed by pcp  . Wears glasses     Past Surgical History:  Procedure Laterality Date  . KNEE ARTHROSCOPY Right ?date  . TOTAL KNEE ARTHROPLASTY Right 06/07/2017   Procedure: RIGHT TOTAL KNEE ARTHROPLASTY, INJECTION LEFT KNEE WITH FLUID ASPIRATION;  Surgeon: Jodi Geralds, MD;  [facility];  Service: Orthopedics;  Laterality: Right;  . TOTAL KNEE ARTHROPLASTY Left 08/23/2017   Procedure: LEFT TOTAL KNEE ARTHROPLASTY;  Surgeon: Jodi Geralds, MD;  [facility];  Service: Orthopedics;  Laterality: Left;    There were no vitals filed for this visit.  Subjective Assessment - 10/29/19 0756    Subjective  "knees are still stiff", pt hasn't been here in a week due to family issues.    Currently in Pain?  No/denies    Pain Score  0-No pain         [facility] PT Assessment - 10/29/19 0001      AROM   Right Knee Flexion  105    Left Knee Flexion  109                   [facility] Adult PT Treatment/Exercise - 10/29/19 0001      Knee/Hip Exercises: Aerobic   Elliptical  I 10, R 6 6 min    Nustep  L 6 6 min LE only      Knee/Hip  Exercises: Machines for Strengthening   Cybex Knee Extension  20# 2x10    Cybex Knee Flexion  35# 2x10    Cybex Leg Press  60#, 2x10      Knee/Hip Exercises: Standing   Forward Lunges  Both;2 sets;10 reps   touch knee on airex   Other Standing Knee Exercises  squats 3#, 2x10    Other Standing Knee Exercises  3# deadlift 3x10      Knee/Hip Exercises: Seated   Sit to Sand  --   attempted from treadmill much dificulty                 PT Long Term Goals - 10/29/19 0856      PT LONG TERM GOAL #3   Title  increase felxibility of bilat hamstrings to 90 degrees to decrease pain in hips and low back ( 10/22/2019)    Baseline  about 80 BIL    Time  6    Period  Weeks    Status  Achieved  Plan - 10/29/19 0850    Clinical Impression Statement  The patient became fatigued while doing the fwd lounges onto the bosu compensation noted w/ leaning to the opposite side, limitations are mostly in the right knee with bending down, sit to stands were attempted but too difficult d/t knee stiffness       Patient will benefit from skilled therapeutic intervention in order to improve the following deficits and impairments:  Difficulty walking, Pain, Decreased activity tolerance, Decreased balance, Decreased strength  Visit Diagnosis: Difficulty in walking, not elsewhere classified  Muscle weakness (generalized)  Stiffness of right knee, not elsewhere classified  Other symptoms and signs involving the musculoskeletal system  Acute pain of left knee  Acute pain of right knee  Localized edema  Stiffness of left knee, not elsewhere classified     Problem List Patient Active Problem List   Diagnosis Date Noted  . Primary osteoarthritis of right knee 06/07/2017  . Primary osteoarthritis of left knee 06/07/2017  . Effusion, left knee 06/07/2017    Clarene Essex, SPTA 10/29/2019, 8:58 AM  [address] [place], [place], [address] Phone: [phone]   Fax:  [phone]  Name: Peter Roth MRN: [id number] Date of Birth: August 06, 1960

## 2019-11-12 ENCOUNTER — Other Ambulatory Visit: Payer: Self-pay

## 2019-11-12 ENCOUNTER — Ambulatory Visit: Payer: Medicare Other | Attending: Orthopedic Surgery | Admitting: Physical Therapy

## 2019-11-12 DIAGNOSIS — R262 Difficulty in walking, not elsewhere classified: Secondary | ICD-10-CM | POA: Insufficient documentation

## 2019-11-12 DIAGNOSIS — R29898 Other symptoms and signs involving the musculoskeletal system: Secondary | ICD-10-CM | POA: Diagnosis present

## 2019-11-12 DIAGNOSIS — M25662 Stiffness of left knee, not elsewhere classified: Secondary | ICD-10-CM | POA: Diagnosis present

## 2019-11-12 DIAGNOSIS — M25661 Stiffness of right knee, not elsewhere classified: Secondary | ICD-10-CM | POA: Insufficient documentation

## 2019-11-12 DIAGNOSIS — M6281 Muscle weakness (generalized): Secondary | ICD-10-CM | POA: Diagnosis present

## 2019-11-12 DIAGNOSIS — M25562 Pain in left knee: Secondary | ICD-10-CM | POA: Diagnosis present

## 2019-11-12 DIAGNOSIS — R600 Localized edema: Secondary | ICD-10-CM | POA: Insufficient documentation

## 2019-11-12 DIAGNOSIS — M25561 Pain in right knee: Secondary | ICD-10-CM | POA: Insufficient documentation

## 2019-11-12 NOTE — Therapy (Signed)
[address] [place], [place], [address] Phone: [phone]   Fax:  [phone]  Physical Therapy Treatment  Patient Details  Name: Peter Roth MRN: [id number] Date of Birth: 09/15/1960 Referring Provider (PT): Dr Dorna Leitz   Encounter Date: 11/12/2019  PT End of Session - 11/12/19 0849    Visit Number  11    Number of Visits  12    Date for PT Re-Evaluation  11/28/19    PT Start Time  0805    PT Stop Time  0848    PT Time Calculation (min)  43 min       Past Medical History:  Diagnosis Date  . Arthritis   . Bipolar 1 disorder ([place])   . Hyperlipidemia   . Hypertension   . Stress headaches   . Type 2 diabetes mellitus ([place])    followed by pcp  . Wears glasses     Past Surgical History:  Procedure Laterality Date  . KNEE ARTHROSCOPY Right ?date  . TOTAL KNEE ARTHROPLASTY Right 06/07/2017   Procedure: RIGHT TOTAL KNEE ARTHROPLASTY, INJECTION LEFT KNEE WITH FLUID ASPIRATION;  Surgeon: Dorna Leitz, MD;  [facility];  Service: Orthopedics;  Laterality: Right;  . TOTAL KNEE ARTHROPLASTY Left 08/23/2017   Procedure: LEFT TOTAL KNEE ARTHROPLASTY;  Surgeon: Dorna Leitz, MD;  [facility];  Service: Orthopedics;  Laterality: Left;    There were no vitals filed for this visit.  Subjective Assessment - 11/12/19 0805    Subjective  knees are stiff, pt hasn't been here in a week because he's been out of town    Currently in Pain?  No/denies    Pain Score  0-No pain         [facility] PT Assessment - 11/12/19 0001      AROM   Right Knee Flexion  102    Left Knee Flexion  109                   [facility] Adult PT Treatment/Exercise - 11/12/19 0001      Knee/Hip Exercises: Aerobic   Elliptical  I 8, R 5, 6 min    Nustep  L5, 6 min LE only      Knee/Hip Exercises: Machines for Strengthening   Cybex Knee Extension  20# 2x10    Cybex Knee Flexion  35# 2x10    Cybex Leg Press   60#, 2x10      Knee/Hip Exercises: Standing   Forward Lunges  2 sets;10 reps   frequent breaks needed   Other Standing Knee Exercises  5# deadlift, 2x10      Knee/Hip Exercises: Seated   Sit to Sand  20 reps;without UE support   from mat; 10 reps w/ 4#                 PT Long Term Goals - 11/12/19 0856      PT LONG TERM GOAL #1   Title  I with HEP to include a walking program ( 10/22/2019)    Status  Partially Met            Plan - 11/12/19 0850    Clinical Impression Statement  Pt hasn't been in a week, frequent breaks needed and cues to prevent trunk flex during the walking lounges, his knee flex ROM has went down a little since his last visit, he still has trouble squatting to low surfaces d/t knee stiffness  PT Treatment/Interventions  Gait training;Taping;Patient/family education;Functional mobility training;Moist Heat;Therapeutic activities;Therapeutic exercise;Cryotherapy;Electrical Stimulation;Manual techniques;Neuromuscular re-education;Dry needling    PT Next Visit Plan  strength/endurance/ROM       Patient will benefit from skilled therapeutic intervention in order to improve the following deficits and impairments:  Difficulty walking, Pain, Decreased activity tolerance, Decreased balance, Decreased strength  Visit Diagnosis: Difficulty in walking, not elsewhere classified  Muscle weakness (generalized)  Stiffness of right knee, not elsewhere classified  Other symptoms and signs involving the musculoskeletal system  Acute pain of left knee  Acute pain of right knee  Localized edema  Stiffness of left knee, not elsewhere classified     Problem List Patient Active Problem List   Diagnosis Date Noted  . Primary osteoarthritis of right knee 06/07/2017  . Primary osteoarthritis of left knee 06/07/2017  . Effusion, left knee 06/07/2017    Clarene Essex, SPTA 11/12/2019, 9:00 AM  [address] [place], [place], [address] Phone: [phone]   Fax:  [phone]  Name: JAMAIL CULLERS MRN: [id number] Date of Birth: 1960/10/15

## 2019-11-17 ENCOUNTER — Other Ambulatory Visit: Payer: Self-pay

## 2019-11-17 ENCOUNTER — Ambulatory Visit: Payer: Medicare Other | Admitting: Physical Therapy

## 2019-11-17 DIAGNOSIS — M25662 Stiffness of left knee, not elsewhere classified: Secondary | ICD-10-CM

## 2019-11-17 DIAGNOSIS — R262 Difficulty in walking, not elsewhere classified: Secondary | ICD-10-CM

## 2019-11-17 DIAGNOSIS — M6281 Muscle weakness (generalized): Secondary | ICD-10-CM

## 2019-11-17 DIAGNOSIS — M25661 Stiffness of right knee, not elsewhere classified: Secondary | ICD-10-CM

## 2019-11-17 NOTE — Therapy (Signed)
[place], [place], [address] Phone: [phone]   Fax:  [phone]  Physical Therapy Treatment  Patient Details  Name: DARAN FAVARO MRN: [id number] Date of Birth: Nov 05, 1960 Referring Provider (PT): Dr Dorna Leitz   Encounter Date: 11/17/2019  PT End of Session - 11/17/19 1348    Visit Number  12    Date for PT Re-Evaluation  11/28/19    Authorization Type  U[employer] and MCD - submitted for initial 3 V    PT Start Time  1315    PT Stop Time  1400    PT Time Calculation (min)  45 min       Past Medical History:  Diagnosis Date  . Arthritis   . Bipolar 1 disorder ([place])   . Hyperlipidemia   . Hypertension   . Stress headaches   . Type 2 diabetes mellitus ([place])    followed by pcp  . Wears glasses     Past Surgical History:  Procedure Laterality Date  . KNEE ARTHROSCOPY Right ?date  . TOTAL KNEE ARTHROPLASTY Right 06/07/2017   Procedure: RIGHT TOTAL KNEE ARTHROPLASTY, INJECTION LEFT KNEE WITH FLUID ASPIRATION;  Surgeon: Dorna Leitz, MD;  Location: [facility];  Service: Orthopedics;  Laterality: Right;  . TOTAL KNEE ARTHROPLASTY Left 08/23/2017   Procedure: LEFT TOTAL KNEE ARTHROPLASTY;  Surgeon: Dorna Leitz, MD;  Location: [facility];  Service: Orthopedics;  Laterality: Left;    There were no vitals filed for this visit.  Subjective Assessment - 11/17/19 1316    Subjective  " knees are numb. I told Dr about it yesterday and he gave me some medicine ( I have not picked up yet) and told me to keep doing PT."    Pain Score  2     Pain Location  Knee    Pain Orientation  Left;Right    Pain Descriptors / Indicators  Tightness                       [facility] Adult PT Treatment/Exercise - 11/17/19 0001      Knee/Hip Exercises: Aerobic   Elliptical  I 10 R 5 3 min fwd/3 min backward    Recumbent Bike  5 min - full rev      Knee/Hip Exercises: Machines for Strengthening   Cybex  Knee Extension  20# 2x15    Cybex Knee Flexion  35# 2x15    Cybex Leg Press  60#, 2x10, 30# SL 10 each      Knee/Hip Exercises: Seated   Sit to Sand  20 reps;without UE support   wt ball press     Manual Therapy   Manual Therapy  Passive ROM    Passive ROM  flex and ext stretches to end range               PT Short Term Goals - 01/23/18 1556      PT SHORT TERM GOAL #1   Title  assure independent with HEP    Time  2    Period  Weeks    Status  Achieved        PT Long Term Goals - 11/12/19 1448      PT LONG TERM GOAL #1   Title  I with HEP to include a walking program ( 10/22/2019)    Status  Partially Met            Plan -  11/17/19 1349    Clinical Impression Statement  pt remains stiff and tight but increased func ROM noted, increased ease of squating and floor transfers. decreased endurance    PT Treatment/Interventions  Gait training;Taping;Patient/family education;Functional mobility training;Moist Heat;Therapeutic activities;Therapeutic exercise;Cryotherapy;Electrical Stimulation;Manual techniques;Neuromuscular re-education;Dry needling    PT Next Visit Plan  strength/endurance/ROM       Patient will benefit from skilled therapeutic intervention in order to improve the following deficits and impairments:  Difficulty walking, Pain, Decreased activity tolerance, Decreased balance, Decreased strength  Visit Diagnosis: Difficulty in walking, not elsewhere classified  Muscle weakness (generalized)  Stiffness of right knee, not elsewhere classified  Stiffness of left knee, not elsewhere classified     Problem List Patient Active Problem List   Diagnosis Date Noted  . Primary osteoarthritis of right knee 06/07/2017  . Primary osteoarthritis of left knee 06/07/2017  . Effusion, left knee 06/07/2017    PAYSEUR,ANGIE PTA 11/17/2019, 1:55 PM  [place],  [place], [address] Phone: [phone]   Fax:  [phone]  Name: KRYSTAL DELDUCA MRN: [id number] Date of Birth: 04-03-1961

## 2019-11-19 ENCOUNTER — Ambulatory Visit: Payer: Medicare Other | Admitting: Physical Therapy

## 2019-11-19 ENCOUNTER — Other Ambulatory Visit: Payer: Self-pay

## 2019-11-19 DIAGNOSIS — R262 Difficulty in walking, not elsewhere classified: Secondary | ICD-10-CM | POA: Diagnosis not present

## 2019-11-19 DIAGNOSIS — M25661 Stiffness of right knee, not elsewhere classified: Secondary | ICD-10-CM

## 2019-11-19 DIAGNOSIS — M6281 Muscle weakness (generalized): Secondary | ICD-10-CM

## 2019-11-19 DIAGNOSIS — M25662 Stiffness of left knee, not elsewhere classified: Secondary | ICD-10-CM

## 2019-11-19 NOTE — Therapy (Signed)
[address] [place], [place], [address] Phone: [phone]   Fax:  [phone]  Physical Therapy Treatment  Patient Details  Name: Peter Roth MRN: [id number] Date of Birth: 1961-06-21 Referring Provider (PT): Dr Dorna Leitz   Encounter Date: 11/19/2019  PT End of Session - 11/19/19 1344    Visit Number  13    Date for PT Re-Evaluation  11/28/19    PT Start Time  1315    PT Stop Time  1400    PT Time Calculation (min)  45 min       Past Medical History:  Diagnosis Date  . Arthritis   . Bipolar 1 disorder ([place])   . Hyperlipidemia   . Hypertension   . Stress headaches   . Type 2 diabetes mellitus ([place])    followed by pcp  . Wears glasses     Past Surgical History:  Procedure Laterality Date  . KNEE ARTHROSCOPY Right ?date  . TOTAL KNEE ARTHROPLASTY Right 06/07/2017   Procedure: RIGHT TOTAL KNEE ARTHROPLASTY, INJECTION LEFT KNEE WITH FLUID ASPIRATION;  Surgeon: Dorna Leitz, MD;  Location: [facility];  Service: Orthopedics;  Laterality: Right;  . TOTAL KNEE ARTHROPLASTY Left 08/23/2017   Procedure: LEFT TOTAL KNEE ARTHROPLASTY;  Surgeon: Dorna Leitz, MD;  Location: [facility];  Service: Orthopedics;  Laterality: Left;    There were no vitals filed for this visit.  Subjective Assessment - 11/19/19 1318    Subjective  just stiff. " i wantto be able to touch my heels to my butt"    Currently in Pain?  Yes    Pain Score  2     Pain Location  Knee    Pain Orientation  Right;Left         [facility] PT Assessment - 11/19/19 0001      AROM   Right Knee Flexion  106    Left Knee Flexion  111                    [facility] Adult PT Treatment/Exercise - 11/19/19 0001      Transfers   Floor to Transfer  6: Modified independent (Device/Increase time);With upper extremity assist;Other (comment)   down to knees with UE assist on mat, then lean back 15x     Knee/Hip Exercises: Aerobic   Elliptical  I 10 R 5 3 min fwd/3 min backward    Recumbent Bike  6 min - full rev      Knee/Hip Exercises: Machines for Strengthening   Cybex Knee Extension  20# 2x15    Cybex Knee Flexion  35# 2x15    Cybex Leg Press  60#, 2x10, 30# SL 10 each      Knee/Hip Exercises: Standing   Other Standing Knee Exercises  SL kneel down to 6 inch step 15 x BIL with 1 UE support    Other Standing Knee Exercises  7# deadlift, 2x10      Knee/Hip Exercises: Seated   Other Seated Knee/Hip Exercises  tricep dip 10 x working on going low to increase knee flexion      Manual Therapy   Manual Therapy  Passive ROM    Passive ROM  flex and ext stretches to end range               PT Short Term Goals - 01/23/18 1556      PT SHORT TERM GOAL #1   Title  assure  independent with HEP    Time  2    Period  Weeks    Status  Achieved        PT Long Term Goals - 11/19/19 1343      PT LONG TERM GOAL #1   Title  I with HEP to include a walking program ( 10/22/2019)    Status  Partially Met      PT LONG TERM GOAL #2   Title  increase bilat hip strength =/> 5-/5 to allow him to transfer floor to stand I'ly ( 10/22/2019)    Baseline  needs assistance or UE support to get up and down from floor    Status  Partially Met      PT LONG TERM GOAL #3   Title  increase felxibility of bilat hamstrings to 90 degrees to decrease pain in hips and low back ( 10/22/2019)    Status  Achieved      PT LONG TERM GOAL #4   Title  walk on uneven surfaces / grass without loss of balance 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 AROM taken at end of session and noted improvement a few degrees. encouraged pt to stretch more at home. pt wants to be able ot touch heels to  buttock but explained to him this may not happen. d/t ROM limitations in knees as well as tightness throught LE- agained stressed need to stretch more    PT Treatment/Interventions  Gait training;Taping;Patient/family education;Functional mobility training;Moist Heat;Therapeutic activities;Therapeutic exercise;Cryotherapy;Electrical Stimulation;Manual techniques;Neuromuscular re-education;Dry needling    PT Next Visit Plan  strength/endurance/ROM       Patient will benefit from skilled therapeutic intervention in order to improve the following deficits and impairments:  Difficulty walking, Pain, Decreased activity tolerance, Decreased balance, Decreased strength  Visit Diagnosis: Muscle weakness (generalized)  Difficulty in walking, not elsewhere classified  Stiffness of right knee, not elsewhere classified  Stiffness of left knee, not elsewhere classified     Problem List Patient Active Problem List   Diagnosis Date Noted  . Primary osteoarthritis of right knee 06/07/2017  . Primary osteoarthritis of left knee 06/07/2017  . Effusion, left knee 06/07/2017    Macklyn Glandon,ANGIE PTA 11/19/2019, 1:57 PM  [place] [address] [address] [place], [place], [address] Phone: [phone]   Fax:  [phone]  Name: JAMYRON REDD MRN: [id number] Date of Birth: [birth year]

## 2019-11-24 ENCOUNTER — Other Ambulatory Visit: Payer: Self-pay

## 2019-11-24 ENCOUNTER — Encounter: Payer: Self-pay | Admitting: Physical Therapy

## 2019-11-24 ENCOUNTER — Ambulatory Visit: Payer: Medicare Other | Admitting: Physical Therapy

## 2019-11-24 DIAGNOSIS — R262 Difficulty in walking, not elsewhere classified: Secondary | ICD-10-CM | POA: Diagnosis not present

## 2019-11-24 DIAGNOSIS — M25661 Stiffness of right knee, not elsewhere classified: Secondary | ICD-10-CM

## 2019-11-24 DIAGNOSIS — M6281 Muscle weakness (generalized): Secondary | ICD-10-CM

## 2019-11-24 NOTE — Therapy (Signed)
[place], [place], [address] Phone: [phone]   Fax:  [phone]  Physical Therapy Treatment  Patient Details  Name: BLANE WORTHINGTON MRN: [id number] Date of Birth: 1961-02-28 Referring Provider (PT): Dr Dorna Leitz   Encounter Date: 11/24/2019  PT End of Session - 11/24/19 1518    Visit Number  14    Date for PT Re-Evaluation  11/28/19    Authorization Type  UHC and MCD - submitted for initial 3 V    PT Start Time  1430    PT Stop Time  1515    PT Time Calculation (min)  45 min    Behavior During Therapy  [facility] for tasks assessed/performed       Past Medical History:  Diagnosis Date  . Arthritis   . Bipolar 1 disorder ([place])   . Hyperlipidemia   . Hypertension   . Stress headaches   . Type 2 diabetes mellitus ([place])    followed by pcp  . Wears glasses     Past Surgical History:  Procedure Laterality Date  . KNEE ARTHROSCOPY Right ?date  . TOTAL KNEE ARTHROPLASTY Right 06/07/2017   Procedure: RIGHT TOTAL KNEE ARTHROPLASTY, INJECTION LEFT KNEE WITH FLUID ASPIRATION;  Surgeon: Dorna Leitz, MD;  Location: [facility];  Service: Orthopedics;  Laterality: Right;  . TOTAL KNEE ARTHROPLASTY Left 08/23/2017   Procedure: LEFT TOTAL KNEE ARTHROPLASTY;  Surgeon: Dorna Leitz, MD;  Location: [facility];  Service: Orthopedics;  Laterality: Left;    There were no vitals filed for this visit.  Subjective Assessment - 11/24/19 1430    Subjective  Stiff    Currently in Pain?  Yes    Pain Score  6     Pain Location  Knee    Pain Orientation  Right                        [facility] Adult PT Treatment/Exercise - 11/24/19 0001      Ambulation/Gait   Gait Comments  4 flights of stairs some every oter step on the way u forr strenght and ROM.      Knee/Hip Exercises: Aerobic   Elliptical  I 10 R 5 3 min fwd/3 min backward    Recumbent Bike  5 min - full rev      Knee/Hip Exercises:  Machines for Strengthening   Cybex Knee Extension  20# 2x15    Cybex Knee Flexion  35# 2x15    Cybex Leg Press  60#, 2x10, 30# SL 10 each      Knee/Hip Exercises: Seated   Sit to Sand  2 sets;without UE support;15 reps   airex under feet from mat table      Manual Therapy   Manual Therapy  Passive ROM    Passive ROM  flex and ext stretches to end range                  PT Long Term Goals - 11/19/19 1343      PT LONG TERM GOAL #1   Title  I with HEP to include a walking program ( 10/22/2019)    Status  Partially Met      PT LONG TERM GOAL #2   Title  increase bilat hip strength =/> 5-/5 to allow him to transfer floor to stand I'ly ( 10/22/2019)    Baseline  needs assistance or UE support to get up and  down from floor    Status  Partially Met      PT LONG TERM GOAL #3   Title  increase felxibility of bilat hamstrings to 90 degrees to decrease pain in hips and low back ( 10/22/2019)    Status  Achieved      PT LONG TERM GOAL #4   Title  walk on uneven surfaces / 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 Pain at the end range of passive flexion. increase fatigue noted with stair negotiation. He continues to report that he wants the ability to touch his knees to buttocks but his ROM is limited.    Personal Factors and Comorbidities  Behavior Pattern;Past/Current Experience;Social Background    Examination-Activity Limitations  Squat;Other    Examination-Participation Restrictions  Community Activity;Yard Work;Other    Stability/Clinical Decision Making  Stable/Uncomplicated    Rehab Potential  Good    PT Frequency  2x / week    PT Duration  6 weeks    PT Treatment/Interventions  Gait  training;Taping;Patient/family education;Functional mobility training;Moist Heat;Therapeutic activities;Therapeutic exercise;Cryotherapy;Electrical Stimulation;Manual techniques;Neuromuscular re-education;Dry needling    PT Next Visit Plan  strength/endurance/ROM       Patient will benefit from skilled therapeutic intervention in order to improve the following deficits and impairments:  Difficulty walking, Pain, Decreased activity tolerance, Decreased balance, Decreased strength  Visit Diagnosis: Difficulty in walking, not elsewhere classified  Stiffness of right knee, not elsewhere classified  Muscle weakness (generalized)     Problem List Patient Active Problem List   Diagnosis Date Noted  . Primary osteoarthritis of right knee 06/07/2017  . Primary osteoarthritis of left knee 06/07/2017  . Effusion, left knee 06/07/2017    Scot Jun, PTA 11/24/2019, 3:31 PM  [place], [place], [address] Phone: [phone]   Fax:  [phone]  Name: AHMARION SARACENO MRN: [id number] Date of Birth: 1960/10/10

## 2019-11-25 ENCOUNTER — Encounter: Payer: Medicare Other | Admitting: Physical Therapy

## 2022-12-18 ENCOUNTER — Emergency Department (HOSPITAL_BASED_OUTPATIENT_CLINIC_OR_DEPARTMENT_OTHER)
Admission: EM | Admit: 2022-12-18 | Discharge: 2022-12-18 | Disposition: A | Discharge status: Home / Self Care | Payer: 59 | Attending: Emergency Medicine | Admitting: Emergency Medicine

## 2022-12-18 ENCOUNTER — Other Ambulatory Visit: Payer: Self-pay

## 2022-12-18 ENCOUNTER — Encounter (HOSPITAL_BASED_OUTPATIENT_CLINIC_OR_DEPARTMENT_OTHER): Payer: Self-pay | Admitting: Urology

## 2022-12-18 ENCOUNTER — Emergency Department (HOSPITAL_BASED_OUTPATIENT_CLINIC_OR_DEPARTMENT_OTHER): Payer: 59

## 2022-12-18 DIAGNOSIS — E119 Type 2 diabetes mellitus without complications: Secondary | ICD-10-CM | POA: Diagnosis not present

## 2022-12-18 DIAGNOSIS — Z79899 Other long term (current) drug therapy: Secondary | ICD-10-CM | POA: Diagnosis not present

## 2022-12-18 DIAGNOSIS — Z7982 Long term (current) use of aspirin: Secondary | ICD-10-CM | POA: Insufficient documentation

## 2022-12-18 DIAGNOSIS — M25532 Pain in left wrist: Secondary | ICD-10-CM

## 2022-12-18 DIAGNOSIS — Z23 Encounter for immunization: Secondary | ICD-10-CM | POA: Diagnosis not present

## 2022-12-18 DIAGNOSIS — S5012XA Contusion of left forearm, initial encounter: Secondary | ICD-10-CM | POA: Diagnosis not present

## 2022-12-18 DIAGNOSIS — I10 Essential (primary) hypertension: Secondary | ICD-10-CM | POA: Insufficient documentation

## 2022-12-18 DIAGNOSIS — Z96653 Presence of artificial knee joint, bilateral: Secondary | ICD-10-CM | POA: Insufficient documentation

## 2022-12-18 DIAGNOSIS — Z7984 Long term (current) use of oral hypoglycemic drugs: Secondary | ICD-10-CM | POA: Insufficient documentation

## 2022-12-18 DIAGNOSIS — M79632 Pain in left forearm: Secondary | ICD-10-CM | POA: Diagnosis present

## 2022-12-18 DIAGNOSIS — Y92410 Unspecified street and highway as the place of occurrence of the external cause: Secondary | ICD-10-CM | POA: Insufficient documentation

## 2022-12-18 MED ORDER — METHOCARBAMOL 500 MG PO TABS: 500.0000 mg | ORAL_TABLET | Freq: Two times a day (BID) | ORAL | 0 refills | Status: DC

## 2022-12-18 MED ORDER — TETANUS-DIPHTH-ACELL PERTUSSIS 5-2.5-18.5 LF-MCG/0.5 IM SUSY
0.5000 mL | PREFILLED_SYRINGE | Freq: Once | INTRAMUSCULAR | Status: AC
  Administered 2022-12-18: 0.5 mL via INTRAMUSCULAR
  Filled 2022-12-18: qty 0.5

## 2022-12-18 NOTE — Discharge Instructions (Addendum)
Your strays do not show any fractures.  Your tetanus vaccine was updated today and you are good for 10 years now.  Although the x-rays do not show any fractures there is a single bone in your wrist that will sometimes not show a fracture on the initial x-ray.  This bone is called the scaphoid bone and it is approximately where you are tender in your left wrist.  I am giving you a wrist splint to wear until you can have a repeat x-ray done in 10 days either with your primary care office or with an orthopedic office.  If that x-ray is without any evidence of fractures you can then remove the splint  Tylenol and ibuprofen for pain --  refrain from Goody's powder for the time being.  Please use Tylenol or ibuprofen for pain.  You may use 600 mg ibuprofen every 6 hours or 1000 mg of Tylenol every 6 hours.  You may choose to alternate between the 2.  This would be most effective.  Not to exceed 4 g of Tylenol within 24 hours.  Not to exceed 3200 mg ibuprofen 24 hours.
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
## 2023-01-09 ENCOUNTER — Ambulatory Visit: Payer: 59 | Attending: Physical Medicine and Rehabilitation

## 2023-01-09 DIAGNOSIS — M6281 Muscle weakness (generalized): Secondary | ICD-10-CM | POA: Diagnosis present

## 2023-01-09 DIAGNOSIS — M5459 Other low back pain: Secondary | ICD-10-CM | POA: Insufficient documentation

## 2023-01-09 DIAGNOSIS — M5441 Lumbago with sciatica, right side: Secondary | ICD-10-CM | POA: Diagnosis present

## 2023-01-17 ENCOUNTER — Ambulatory Visit: Payer: 59 | Admitting: Physical Therapy

## 2023-01-17 DIAGNOSIS — M5459 Other low back pain: Secondary | ICD-10-CM

## 2023-01-17 DIAGNOSIS — M5441 Lumbago with sciatica, right side: Secondary | ICD-10-CM

## 2023-01-17 DIAGNOSIS — M6281 Muscle weakness (generalized): Secondary | ICD-10-CM
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
## 2023-01-22 ENCOUNTER — Ambulatory Visit: Payer: 59 | Admitting: Physical Therapy

## 2023-01-22 DIAGNOSIS — M5441 Lumbago with sciatica, right side: Secondary | ICD-10-CM

## 2023-01-22 DIAGNOSIS — M5459 Other low back pain: Secondary | ICD-10-CM

## 2023-01-22 DIAGNOSIS — M6281 Muscle weakness (generalized): Secondary | ICD-10-CM
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
## 2023-01-24 ENCOUNTER — Ambulatory Visit: Payer: 59 | Admitting: Physical Therapy

## 2023-01-24 DIAGNOSIS — M5459 Other low back pain: Secondary | ICD-10-CM

## 2023-01-24 DIAGNOSIS — M5441 Lumbago with sciatica, right side: Secondary | ICD-10-CM

## 2023-01-24 DIAGNOSIS — M6281 Muscle weakness (generalized): Secondary | ICD-10-CM
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
## 2023-01-29 ENCOUNTER — Ambulatory Visit: Payer: 59 | Admitting: Physical Therapy

## 2023-01-29 DIAGNOSIS — M5459 Other low back pain: Secondary | ICD-10-CM

## 2023-01-29 DIAGNOSIS — M5441 Lumbago with sciatica, right side: Secondary | ICD-10-CM

## 2023-01-29 DIAGNOSIS — M6281 Muscle weakness (generalized): Secondary | ICD-10-CM
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
## 2023-01-31 ENCOUNTER — Ambulatory Visit: Payer: 59

## 2023-01-31 DIAGNOSIS — M5459 Other low back pain: Secondary | ICD-10-CM | POA: Diagnosis not present

## 2023-01-31 DIAGNOSIS — M5441 Lumbago with sciatica, right side: Secondary | ICD-10-CM

## 2023-01-31 DIAGNOSIS — M6281 Muscle weakness (generalized): Secondary | ICD-10-CM

## 2023-02-01 ENCOUNTER — Ambulatory Visit: Payer: 59 | Admitting: Physical Therapy

## 2023-02-05 ENCOUNTER — Ambulatory Visit: Payer: 59 | Admitting: Physical Therapy

## 2023-02-05 DIAGNOSIS — M5441 Lumbago with sciatica, right side: Secondary | ICD-10-CM

## 2023-02-05 DIAGNOSIS — M6281 Muscle weakness (generalized): Secondary | ICD-10-CM

## 2023-02-05 DIAGNOSIS — M5459 Other low back pain: Secondary | ICD-10-CM | POA: Diagnosis not present
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
## 2023-02-07 ENCOUNTER — Ambulatory Visit: Payer: 59 | Admitting: Physical Therapy

## 2023-02-12 ENCOUNTER — Ambulatory Visit: Payer: 59 | Attending: Physical Medicine and Rehabilitation | Admitting: Physical Therapy

## 2023-02-12 DIAGNOSIS — M6281 Muscle weakness (generalized): Secondary | ICD-10-CM | POA: Insufficient documentation

## 2023-02-12 DIAGNOSIS — M5441 Lumbago with sciatica, right side: Secondary | ICD-10-CM | POA: Insufficient documentation

## 2023-02-12 DIAGNOSIS — M5459 Other low back pain: Secondary | ICD-10-CM | POA: Insufficient documentation
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
## 2023-02-14 ENCOUNTER — Ambulatory Visit: Payer: 59 | Admitting: Physical Therapy

## 2023-02-14 DIAGNOSIS — M5441 Lumbago with sciatica, right side: Secondary | ICD-10-CM

## 2023-02-14 DIAGNOSIS — M5459 Other low back pain: Secondary | ICD-10-CM | POA: Diagnosis not present

## 2023-02-14 DIAGNOSIS — M6281 Muscle weakness (generalized): Secondary | ICD-10-CM
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
## 2023-02-19 ENCOUNTER — Ambulatory Visit: Payer: 59 | Admitting: Physical Therapy

## 2023-02-19 DIAGNOSIS — M6281 Muscle weakness (generalized): Secondary | ICD-10-CM

## 2023-02-19 DIAGNOSIS — M5459 Other low back pain: Secondary | ICD-10-CM
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
## 2023-02-21 ENCOUNTER — Ambulatory Visit: Payer: 59 | Admitting: Physical Therapy

## 2023-02-21 DIAGNOSIS — M5459 Other low back pain: Secondary | ICD-10-CM

## 2023-02-21 DIAGNOSIS — M6281 Muscle weakness (generalized): Secondary | ICD-10-CM

## 2023-02-21 DIAGNOSIS — M5441 Lumbago with sciatica, right side: Secondary | ICD-10-CM
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
## 2023-02-26 ENCOUNTER — Ambulatory Visit: Payer: 59 | Admitting: Physical Therapy

## 2023-02-26 DIAGNOSIS — M5441 Lumbago with sciatica, right side: Secondary | ICD-10-CM

## 2023-02-26 DIAGNOSIS — M6281 Muscle weakness (generalized): Secondary | ICD-10-CM

## 2023-02-26 DIAGNOSIS — M5459 Other low back pain: Secondary | ICD-10-CM | POA: Diagnosis not present
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
## 2023-02-28 ENCOUNTER — Ambulatory Visit: Payer: 59 | Admitting: Physical Therapy

## 2023-03-07 ENCOUNTER — Ambulatory Visit: Payer: 59 | Admitting: Physical Therapy

## 2023-03-14 ENCOUNTER — Ambulatory Visit: Payer: 59 | Admitting: Physical Therapy

## 2023-03-19 ENCOUNTER — Ambulatory Visit: Payer: 59 | Attending: Physical Medicine and Rehabilitation | Admitting: Physical Therapy

## 2023-03-19 DIAGNOSIS — M6281 Muscle weakness (generalized): Secondary | ICD-10-CM | POA: Insufficient documentation

## 2023-03-19 DIAGNOSIS — M5441 Lumbago with sciatica, right side: Secondary | ICD-10-CM | POA: Insufficient documentation

## 2023-03-19 DIAGNOSIS — M5459 Other low back pain: Secondary | ICD-10-CM | POA: Diagnosis present
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
## 2023-03-21 ENCOUNTER — Ambulatory Visit: Payer: 59

## 2023-03-21 DIAGNOSIS — M5459 Other low back pain: Secondary | ICD-10-CM

## 2023-03-21 DIAGNOSIS — M5441 Lumbago with sciatica, right side: Secondary | ICD-10-CM

## 2023-03-21 DIAGNOSIS — M6281 Muscle weakness (generalized): Secondary | ICD-10-CM

## 2023-03-26 ENCOUNTER — Ambulatory Visit: Payer: 59 | Admitting: Physical Therapy
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
## 2023-03-28 ENCOUNTER — Ambulatory Visit: Payer: 59

## 2023-03-28 DIAGNOSIS — M5459 Other low back pain: Secondary | ICD-10-CM

## 2023-03-28 DIAGNOSIS — M5441 Lumbago with sciatica, right side: Secondary | ICD-10-CM

## 2023-03-28 DIAGNOSIS — M6281 Muscle weakness (generalized): Secondary | ICD-10-CM

## 2023-04-02 ENCOUNTER — Ambulatory Visit: Payer: 59 | Admitting: Physical Therapy

## 2023-04-04 ENCOUNTER — Ambulatory Visit: Payer: 59 | Admitting: Physical Therapy

## 2023-04-04 DIAGNOSIS — M5441 Lumbago with sciatica, right side: Secondary | ICD-10-CM

## 2023-04-04 DIAGNOSIS — M6281 Muscle weakness (generalized): Secondary | ICD-10-CM

## 2023-04-04 DIAGNOSIS — M5459 Other low back pain: Secondary | ICD-10-CM
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
## 2023-04-09 ENCOUNTER — Ambulatory Visit: Payer: 59

## 2023-04-11 ENCOUNTER — Ambulatory Visit: Payer: 59 | Admitting: Physical Therapy

## 2023-04-16 ENCOUNTER — Ambulatory Visit: Payer: 59 | Attending: Physical Medicine and Rehabilitation | Admitting: Physical Therapy

## 2023-04-16 DIAGNOSIS — M5441 Lumbago with sciatica, right side: Secondary | ICD-10-CM | POA: Diagnosis present

## 2023-04-16 DIAGNOSIS — M5459 Other low back pain: Secondary | ICD-10-CM | POA: Diagnosis present

## 2023-04-16 DIAGNOSIS — M6281 Muscle weakness (generalized): Secondary | ICD-10-CM | POA: Insufficient documentation
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
## 2023-04-18 ENCOUNTER — Ambulatory Visit: Payer: 59

## 2023-04-18 DIAGNOSIS — M5459 Other low back pain: Secondary | ICD-10-CM | POA: Diagnosis not present

## 2023-04-18 DIAGNOSIS — M5441 Lumbago with sciatica, right side: Secondary | ICD-10-CM

## 2023-04-18 DIAGNOSIS — M6281 Muscle weakness (generalized): Secondary | ICD-10-CM

## 2023-04-23 ENCOUNTER — Ambulatory Visit: Payer: 59 | Admitting: Physical Therapy

## 2023-04-23 DIAGNOSIS — M5459 Other low back pain: Secondary | ICD-10-CM

## 2023-04-23 DIAGNOSIS — M5441 Lumbago with sciatica, right side: Secondary | ICD-10-CM

## 2023-04-23 DIAGNOSIS — M6281 Muscle weakness (generalized): Secondary | ICD-10-CM
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
## 2023-04-25 ENCOUNTER — Ambulatory Visit: Payer: 59

## 2023-04-25 DIAGNOSIS — M5441 Lumbago with sciatica, right side: Secondary | ICD-10-CM

## 2023-04-25 DIAGNOSIS — M5459 Other low back pain: Secondary | ICD-10-CM

## 2023-04-25 DIAGNOSIS — M6281 Muscle weakness (generalized): Secondary | ICD-10-CM
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
## 2023-04-30 ENCOUNTER — Ambulatory Visit: Payer: 59

## 2023-04-30 DIAGNOSIS — M5441 Lumbago with sciatica, right side: Secondary | ICD-10-CM

## 2023-04-30 DIAGNOSIS — M5459 Other low back pain: Secondary | ICD-10-CM

## 2023-04-30 DIAGNOSIS — M6281 Muscle weakness (generalized): Secondary | ICD-10-CM

## 2023-05-02 ENCOUNTER — Ambulatory Visit: Payer: 59 | Admitting: Physical Therapy

## 2023-05-07 ENCOUNTER — Ambulatory Visit: Payer: 59 | Admitting: Physical Therapy
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
## 2023-05-09 ENCOUNTER — Ambulatory Visit: Payer: 59

## 2023-05-09 DIAGNOSIS — M5459 Other low back pain: Secondary | ICD-10-CM

## 2023-05-09 DIAGNOSIS — M5441 Lumbago with sciatica, right side: Secondary | ICD-10-CM

## 2023-05-09 DIAGNOSIS — M6281 Muscle weakness (generalized): Secondary | ICD-10-CM

## 2023-09-27 ENCOUNTER — Emergency Department (HOSPITAL_COMMUNITY)

## 2023-09-27 ENCOUNTER — Encounter (HOSPITAL_COMMUNITY): Payer: Self-pay

## 2023-09-27 ENCOUNTER — Emergency Department (HOSPITAL_COMMUNITY)
Admission: EM | Admit: 2023-09-27 | Discharge: 2023-09-27 | Disposition: A | Discharge status: Home / Self Care | Attending: Emergency Medicine | Admitting: Emergency Medicine

## 2023-09-27 DIAGNOSIS — Z7982 Long term (current) use of aspirin: Secondary | ICD-10-CM | POA: Insufficient documentation

## 2023-09-27 DIAGNOSIS — Z7984 Long term (current) use of oral hypoglycemic drugs: Secondary | ICD-10-CM | POA: Insufficient documentation

## 2023-09-27 DIAGNOSIS — M25561 Pain in right knee: Secondary | ICD-10-CM | POA: Insufficient documentation

## 2023-09-27 DIAGNOSIS — Z79899 Other long term (current) drug therapy: Secondary | ICD-10-CM | POA: Insufficient documentation

## 2023-09-27 DIAGNOSIS — Z96653 Presence of artificial knee joint, bilateral: Secondary | ICD-10-CM | POA: Diagnosis not present

## 2023-09-27 DIAGNOSIS — W19XXXA Unspecified fall, initial encounter: Secondary | ICD-10-CM | POA: Diagnosis not present

## 2023-09-27 DIAGNOSIS — I10 Essential (primary) hypertension: Secondary | ICD-10-CM | POA: Insufficient documentation

## 2023-09-27 DIAGNOSIS — E119 Type 2 diabetes mellitus without complications: Secondary | ICD-10-CM | POA: Diagnosis not present

## 2023-09-27 NOTE — ED Provider Notes (Signed)
 [facility] EMERGENCY DEPARTMENT AT [facility] Provider Note   CSN: [id number] Arrival date & time: 09/27/23  0446     History  Chief Complaint  Patient presents with   Knee Pain    Peter Roth is a 63 y.o. male.  Patient with past medical history significant for bilateral TKA, right sided done in 2018, type II DM, bipolar 1 disorder, hypertension presents to the emergency department complaining of right knee pain.  He states that he was walking he felt that his knee gave out.  He currently complains of a pins and needle sensation in the right knee.  He denies any fever, nausea, vomiting.  He is able to bend the knee without pain.  He was ambulatory upon arrival.  He denies hitting his head, denies other injuries during the fall.   Knee Pain      Home Medications Prior to Admission medications   Medication Sig Start Date End Date Taking? Authorizing Provider  aspirin EC 325 MG tablet Take 1 tablet (325 mg total) by mouth 2 (two) times daily after a meal. Take x 1 month post op to decrease risk of blood clots. 08/23/17   Marshia Ly, PA-C  atorvastatin (LIPITOR) 40 MG tablet Take 40 mg by mouth daily. 06/23/15   [provider]  divalproex (DEPAKOTE ER) 500 MG 24 hr tablet Take 1,000 mg by mouth at bedtime.    [provider]  docusate sodium (COLACE) 100 MG capsule Take 1 capsule (100 mg total) by mouth 2 (two) times daily. Patient taking differently: Take 100 mg by mouth 2 (two) times daily as needed for mild constipation.  06/07/17   Marshia Ly, PA-C  doxycycline (VIBRAMYCIN) 100 MG capsule Take 1 capsule (100 mg total) by mouth 2 (two) times daily. One po bid x 7 days 11/30/17   Molpus, John, MD  lisinopril (PRINIVIL,ZESTRIL) 10 MG tablet Take 10 mg daily by mouth.    [provider]  methocarbamol (ROBAXIN) 500 MG tablet Take 1 tablet (500 mg total) by mouth 2 (two) times daily. 12/18/22   Gailen Shelter, PA  omeprazole  (PRILOSEC) 40 MG capsule Take 40 mg by mouth daily as needed (acid reflux).     [provider]  oxyCODONE-acetaminophen (PERCOCET/ROXICET) 5-325 MG tablet Take 1-2 tablets by mouth every 4 (four) hours as needed for severe pain. 08/23/17   Marshia Ly, PA-C  SitaGLIPtin-MetFORMIN HCl (JANUMET XR) 50-500 MG TB24 Take 1 tablet by mouth daily.    [provider]  tiZANidine (ZANAFLEX) 2 MG tablet Take 1 tablet (2 mg total) by mouth every 8 (eight) hours as needed for muscle spasms. 08/23/17   Marshia Ly, PA-C      Allergies    Sulfa antibiotics and Cephalosporins    Review of Systems   Review of Systems  Physical Exam Updated Vital Signs BP (!) 169/92   Pulse 80   Temp 97.9 F (36.6 C) (Oral)   Resp 16   SpO2 98%  Physical Exam Vitals and nursing note reviewed.  HENT:     Head: Normocephalic and atraumatic.  Eyes:     Pupils: Pupils are equal, round, and reactive to light.  Pulmonary:     Effort: Pulmonary effort is normal. No respiratory distress.  Musculoskeletal:        General: No signs of injury.     Cervical back: Normal range of motion.     Comments: Right knee with similar temperature to the left  knee.  No significant erythema.  Patient with somewhat limited range of motion but states this has been baseline for some time.  No significant increase in pain with passive range of motion.  Palpable pedal pulse.  Sensation intact.  Skin:    General: Skin is dry.  Neurological:     Mental Status: He is alert.  Psychiatric:        Speech: Speech normal.        Behavior: Behavior normal.     ED Results / Procedures / Treatments   Labs (all labs ordered are listed, but only abnormal results are displayed) Labs Reviewed - No data to display  EKG None  Radiology DG Knee Complete 4 Views Right Result Date: 09/27/2023 CLINICAL DATA:  Or or fall injury with left knee pain. EXAM: RIGHT KNEE - COMPLETE 4+ VIEW COMPARISON:  Right knee films 12/18/2022.  FINDINGS: There is a small effusion in the suprapatellar bursa. A total joint arthroplasty is again noted without radiographic evidence of significant loosening. The bone mineralization is normal with no evidence of fracture or dislocation. Soft tissues are unremarkable apart from calcifications in the distal superficial femoral and popliteal trifurcation arteries. IMPRESSION: 1. Small effusion in the suprapatellar bursa. No evidence of fracture or dislocation. 2. Total joint arthroplasty without radiographic evidence of significant loosening. 3. Atherosclerosis. Electronically Signed   By: Almira Bar M.D.   On: 09/27/2023 05:29    Procedures Procedures    Medications Ordered in ED Medications - No data to display  ED Course/ Medical Decision Making/ A&P                                 Medical Decision Making Amount and/or Complexity of Data Reviewed Radiology: ordered.   This patient presents to the ED for concern of knee pain, this involves an extensive number of treatment options, and is a complaint that carries with it a high risk of complications and morbidity.  The differential diagnosis includes fracture, dislocation, prosthetic loosening, infection, others   Co morbidities that complicate the patient evaluation  History of total knee replacement    Imaging Studies ordered:  I ordered imaging studies including plain films of the right knee I independently visualized and interpreted imaging which showed  1. Small effusion in the suprapatellar bursa. No evidence of  fracture or dislocation.  2. Total joint arthroplasty without radiographic evidence of  significant loosening.  3. Atherosclerosis.   I agree with the radiologist interpretation    Social Determinants of Health:  Patient is a tobacco smoker   Test / Admission - Considered:  Presentation not consistent with septic arthritis.  Patient denies fevers or systemic symptoms.  Patient with range of motion  without increased pain.  No significant warmth when compared to contralateral side.  No significant effusion noted.  X-rays are grossly unremarkable.  Patient is ambulatory.  Plan to have patient follow-up with his orthopedic surgeon for further evaluation.  Return precautions provided including systemic symptoms such as fever.         Final Clinical Impression(s) / ED Diagnoses Final diagnoses:  Right knee pain, unspecified chronicity  Fall, initial encounter    Rx / DC Orders ED Discharge Orders     None         Pamala Duffel 09/27/23 0541    Dione Booze, MD 09/27/23 [phone]

## 2023-09-27 NOTE — Discharge Instructions (Signed)
 Please schedule a follow-up appointment with the orthopedic surgeon for further evaluation of your right sided knee pain.  You may take over-the-counter medication for pain control.  If you develop any emergent symptoms please return to the emergency department.

## 2023-09-27 NOTE — ED Triage Notes (Signed)
 Pt states that he fell this morning after his R knee gave out, hx of surgery on that knee. Swelling and redness noted. Denies injury from fall

## 2024-03-15 ENCOUNTER — Other Ambulatory Visit: Payer: Self-pay

## 2024-03-15 ENCOUNTER — Encounter (HOSPITAL_COMMUNITY): Payer: Self-pay

## 2024-03-15 ENCOUNTER — Emergency Department (HOSPITAL_COMMUNITY)
Admission: EM | Admit: 2024-03-15 | Discharge: 2024-03-15 | Disposition: A | Discharge status: Home / Self Care | Attending: Emergency Medicine | Admitting: Emergency Medicine

## 2024-03-15 DIAGNOSIS — X500XXA Overexertion from strenuous movement or load, initial encounter: Secondary | ICD-10-CM | POA: Diagnosis not present

## 2024-03-15 DIAGNOSIS — Z7982 Long term (current) use of aspirin: Secondary | ICD-10-CM | POA: Insufficient documentation

## 2024-03-15 DIAGNOSIS — I10 Essential (primary) hypertension: Secondary | ICD-10-CM | POA: Diagnosis not present

## 2024-03-15 DIAGNOSIS — Z79899 Other long term (current) drug therapy: Secondary | ICD-10-CM | POA: Diagnosis not present

## 2024-03-15 DIAGNOSIS — S39012A Strain of muscle, fascia and tendon of lower back, initial encounter: Secondary | ICD-10-CM | POA: Insufficient documentation

## 2024-03-15 DIAGNOSIS — E119 Type 2 diabetes mellitus without complications: Secondary | ICD-10-CM | POA: Diagnosis not present

## 2024-03-15 DIAGNOSIS — M545 Low back pain, unspecified: Secondary | ICD-10-CM | POA: Diagnosis present

## 2024-03-15 MED ORDER — KETOROLAC TROMETHAMINE 15 MG/ML IJ SOLN
15.0000 mg | Freq: Once | INTRAMUSCULAR | Status: AC
  Administered 2024-03-15: 15 mg via INTRAMUSCULAR
  Filled 2024-03-15: qty 1

## 2024-03-15 MED ORDER — METHOCARBAMOL 500 MG PO TABS: 500.0000 mg | ORAL_TABLET | Freq: Two times a day (BID) | ORAL | 0 refills | Status: AC

## 2024-03-15 NOTE — ED Notes (Signed)
 Pt ambulated independently with steady gait to treatment room.

## 2024-03-15 NOTE — Discharge Instructions (Signed)
###   Low Back Strain Discharge     You were treated today for a muscle strain in your lower back. This is a common problem, and most people get much better within a few weeks. Here are some important steps to help you recover safely:      **1. What to Expect**      - Most people with low back strain start to feel better within a few days to weeks. It is very unlikely that your pain is caused by something serious.      - It is normal to have some pain while you heal.      **2. Activity**      - Try to stay active and do your normal daily activities as much as you can, even if you have some pain. Avoid staying in bed for long periods.      - Gentle movement and walking are good for your back.      - If an activity makes your pain much worse, take a break and try again later.      **3. Pain Relief**      - You received ketorolac  (Toradol ) in the hospital. At home, you may use ibuprofen  (Advil , Motrin ) for pain. Take it as directed on the package, and do not take more than recommended.      - You were also prescribed methocarbamol  (Robaxin ), a muscle relaxant. Take it only as directed by your doctor. This medicine may make you feel sleepy, so do not drive or use heavy machinery until you know how it affects you.      - If you have stomach pain, kidney problems, or a history of ulcers, talk to your doctor before using ibuprofen .      - Do not use both ketorolac  and ibuprofen  at the same time.      **4. Other Ways to Help Your Back**      - Using a heating pad or warm towel on your back may help with pain.      - Gentle massage can also help some people feel better.      - Avoid heavy lifting and twisting until your pain improves.      **5. When to Call Your Doctor**      Call your doctor or go to the emergency room if you have:      - Numbness or tingling in your legs      - Weakness in your legs      - Trouble controlling your bladder or bowels      - Severe pain that does  not get better      **6. Follow-Up**      - Make an appointment with your primary care provider in the next week or two to check your progress and talk about any ongoing pain or problems.      **7. Medication Safety**      - Only take the medicines as prescribed. Do not mix pain medicines unless your doctor says it is okay.      - Keep all medicines out of reach of children.      Most people with low back strain get better with time, staying active, and using pain medicine safely. If you have any new or serious symptoms, contact your doctor right away.

## 2024-03-15 NOTE — ED Triage Notes (Signed)
 Patient reports back pain for 2 days. States he was helping his sister move and may have strained his back. Patient took medication and used a heating pad at home with no relief.

## 2024-03-15 NOTE — ED Provider Notes (Signed)
 [place] [facility] AT [facility] Provider Note   CSN: [id number] Arrival date & time: 03/15/24  0446     Patient presents with: Back Pain   Peter Roth is a 63 y.o. male.  Patient with past medical history significant for type II DM, bipolar 1 disorder, hypertension, arthritis presents to the emergency room complaining of 2 days of lower left-sided back pain.  The patient states that he was helping move furniture into a new house when he began to have pain in the low back.  He denies any radiation of symptoms into his legs.  He denies any weakness, numbness, tingling, bladder or bowel dysfunction.    Back Pain      Prior to Admission medications   Medication Sig Start Date End Date Taking? Authorizing Provider  methocarbamol  (ROBAXIN ) 500 MG tablet Take 1 tablet (500 mg total) by mouth 2 (two) times daily. 03/15/24  Yes Logan Ubaldo NOVAK, PA-C  aspirin  EC 325 MG tablet Take 1 tablet (325 mg total) by mouth 2 (two) times daily after a meal. Take x 1 month post op to decrease risk of blood clots. 08/23/17   Rondall Agent, PA-C  atorvastatin  (LIPITOR) 40 MG tablet Take 40 mg by mouth daily. 06/23/15   [provider]  divalproex  (DEPAKOTE  ER) 500 MG 24 hr tablet Take 1,000 mg by mouth at bedtime.    [provider]  docusate sodium  (COLACE) 100 MG capsule Take 1 capsule (100 mg total) by mouth 2 (two) times daily. Patient taking differently: Take 100 mg by mouth 2 (two) times daily as needed for mild constipation.  06/07/17   Rondall Agent, PA-C  doxycycline  (VIBRAMYCIN ) 100 MG capsule Take 1 capsule (100 mg total) by mouth 2 (two) times daily. One po bid x 7 days 11/30/17   Molpus, John, MD  lisinopril  (PRINIVIL ,ZESTRIL ) 10 MG tablet Take 10 mg daily by mouth.    [provider]  omeprazole (PRILOSEC) 40 MG capsule Take 40 mg by mouth daily as needed (acid reflux).     [provider]  oxyCODONE -acetaminophen   (PERCOCET/ROXICET) 5-325 MG tablet Take 1-2 tablets by mouth every 4 (four) hours as needed for severe pain. 08/23/17   Rondall Agent, PA-C  SitaGLIPtin -MetFORMIN  HCl (JANUMET  XR) 50-500 MG TB24 Take 1 tablet by mouth daily.    [provider]  tiZANidine  (ZANAFLEX ) 2 MG tablet Take 1 tablet (2 mg total) by mouth every 8 (eight) hours as needed for muscle spasms. 08/23/17   Rondall Agent, PA-C    Allergies: Sulfa  antibiotics and Cephalosporins    Review of Systems  Musculoskeletal:  Positive for back pain.    Updated Vital Signs BP (!) 151/78 (BP Location: Right Arm)   Pulse 69   Temp (!) 97.3 F (36.3 C)   Resp (!) 24   SpO2 99%   Physical Exam Vitals and nursing note reviewed.  HENT:     Head: Normocephalic and atraumatic.  Eyes:     Conjunctiva/sclera: Conjunctivae normal.  Cardiovascular:     Rate and Rhythm: Normal rate and regular rhythm.  Pulmonary:     Effort: Pulmonary effort is normal. No respiratory distress.     Breath sounds: Normal breath sounds.  Musculoskeletal:        General: No swelling, deformity or signs of injury. Normal range of motion.     Cervical back: Normal range of motion and neck supple. No tenderness.     Comments: Grossly normal range of motion  of bilateral lower extremities.  No increase in pain with passive range of motion of the left leg.  Patient does have tenderness to palpation of the left lower lumbar region with no midline spinal tenderness appreciated.  Skin:    General: Skin is dry.  Neurological:     Mental Status: He is alert.  Psychiatric:        Speech: Speech normal.        Behavior: Behavior normal.     (all labs ordered are listed, but only abnormal results are displayed) Labs Reviewed - No data to display  EKG: None  Radiology: No results found.   Procedures   Medications Ordered in the ED  ketorolac  (TORADOL ) 15 MG/ML injection 15 mg (15 mg Intramuscular Given 03/15/24 9388)                                     Medical Decision Making Risk Prescription drug management.   This patient presents to the ED for concern of back pain, this involves an extensive number of treatment options, and is a complaint that carries with it a high risk of complications and morbidity.  The differential diagnosis includes soft tissue injury, fracture, dislocation, cauda equina, herniated disc, others   Co morbidities / Chronic conditions that complicate the patient evaluation  Arthritis, hypertension, bipolar 1 disorder   Additional history obtained:  Additional history obtained from EMR    Imaging Studies ordered:  Patient with no red flag symptoms such as saddle anesthesia, urinary incontinence, urinary retention, fecal incontinence, midline spinal tenderness.  No indication for emergent spinal imaging   Problem List / ED Course / Critical interventions / Medication management   I ordered medication including Toradol  Reevaluation of the patient after these medicines showed that the patient improved I have reviewed the patients home medicines and have made adjustments as needed   Social Determinants of Health:  Patient is a daily smoker   Test / Admission - Considered:  Patient with lower left-sided lumbar pain.  History not consistent with fracture or dislocation.  No radicular symptoms.  No red flag symptoms to suggest cauda equina.  No weakness or sensory deficits.  Presentation most consistent with a lumbar strain.  Patient treated with Toradol .  Plan to discharge home with Robaxin  prescription.  Patient may take ibuprofen  for pain control.  Patient to follow-up with primary care provider for further evaluation as needed.  Return precautions provided.      Final diagnoses:  Strain of lumbar region, initial encounter    ED Discharge Orders          Ordered    methocarbamol  (ROBAXIN ) 500 MG tablet  2 times daily        03/15/24 0546               Logan Ubaldo NOVAK,  PA-C 03/15/24 0617    [name], MD 03/15/24 [phone]
